# Patient Record
Sex: Male | Born: 1940 | Race: White | Hispanic: No | Marital: Married | Smoking: Former smoker
Health system: Southern US, Community
[De-identification: ages and names within clinical notes are randomized; demographics above are authoritative.]

## PROBLEM LIST (undated history)

## (undated) DIAGNOSIS — L570 Actinic keratosis: Secondary | ICD-10-CM

## (undated) DIAGNOSIS — I209 Angina pectoris, unspecified: Secondary | ICD-10-CM

## (undated) DIAGNOSIS — I10 Essential (primary) hypertension: Secondary | ICD-10-CM

## (undated) DIAGNOSIS — N401 Enlarged prostate with lower urinary tract symptoms: Secondary | ICD-10-CM

## (undated) DIAGNOSIS — I70219 Atherosclerosis of native arteries of extremities with intermittent claudication, unspecified extremity: Secondary | ICD-10-CM

## (undated) DIAGNOSIS — I639 Cerebral infarction, unspecified: Secondary | ICD-10-CM

## (undated) DIAGNOSIS — E669 Obesity, unspecified: Secondary | ICD-10-CM

## (undated) DIAGNOSIS — G4730 Sleep apnea, unspecified: Secondary | ICD-10-CM

## (undated) DIAGNOSIS — N475 Adhesions of prepuce and glans penis: Secondary | ICD-10-CM

## (undated) DIAGNOSIS — R0602 Shortness of breath: Secondary | ICD-10-CM

## (undated) DIAGNOSIS — N138 Other obstructive and reflux uropathy: Secondary | ICD-10-CM

## (undated) DIAGNOSIS — I509 Heart failure, unspecified: Secondary | ICD-10-CM

## (undated) DIAGNOSIS — E119 Type 2 diabetes mellitus without complications: Secondary | ICD-10-CM

## (undated) DIAGNOSIS — E785 Hyperlipidemia, unspecified: Secondary | ICD-10-CM

## (undated) HISTORY — DX: Other obstructive and reflux uropathy: N13.8

## (undated) HISTORY — DX: Hyperlipidemia, unspecified: E78.5

## (undated) HISTORY — DX: Atherosclerosis of native arteries of extremities with intermittent claudication, unspecified extremity: I70.219

## (undated) HISTORY — DX: Obesity, unspecified: E66.9

## (undated) HISTORY — DX: Angina pectoris, unspecified: I20.9

## (undated) HISTORY — DX: Shortness of breath: R06.02

## (undated) HISTORY — PX: CORONARY ANGIOPLASTY WITH STENT PLACEMENT: SHX49

## (undated) HISTORY — DX: Adhesions of prepuce and glans penis: N47.5

## (undated) HISTORY — DX: Heart failure, unspecified: I50.9

## (undated) HISTORY — PX: BACK SURGERY: SHX140

## (undated) HISTORY — DX: Essential (primary) hypertension: I10

## (undated) HISTORY — DX: Type 2 diabetes mellitus without complications: E11.9

## (undated) HISTORY — PX: COLONOSCOPY: SHX174

## (undated) HISTORY — DX: Cerebral infarction, unspecified: I63.9

## (undated) HISTORY — PX: CARDIAC CATHETERIZATION: SHX172

## (undated) HISTORY — DX: Actinic keratosis: L57.0

## (undated) HISTORY — DX: Other obstructive and reflux uropathy: N40.1

---

## 1994-07-11 HISTORY — PX: CORONARY ARTERY BYPASS GRAFT: SHX141

## 2002-07-11 HISTORY — PX: JOINT REPLACEMENT: SHX530

## 2005-07-11 HISTORY — PX: HEMORRHOID SURGERY: SHX153

## 2005-08-10 ENCOUNTER — Ambulatory Visit: Payer: Self-pay | Admitting: Emergency Medicine

## 2005-08-16 ENCOUNTER — Ambulatory Visit: Payer: Self-pay | Admitting: Emergency Medicine

## 2005-08-22 ENCOUNTER — Ambulatory Visit: Payer: Self-pay | Admitting: Emergency Medicine

## 2006-11-06 ENCOUNTER — Ambulatory Visit: Payer: Self-pay | Admitting: General Surgery

## 2006-11-06 ENCOUNTER — Other Ambulatory Visit: Payer: Self-pay

## 2006-11-09 ENCOUNTER — Ambulatory Visit: Payer: Self-pay | Admitting: Gastroenterology

## 2006-11-10 ENCOUNTER — Ambulatory Visit: Payer: Self-pay | Admitting: General Surgery

## 2007-02-06 ENCOUNTER — Ambulatory Visit: Payer: Self-pay | Admitting: Internal Medicine

## 2007-05-15 DIAGNOSIS — E785 Hyperlipidemia, unspecified: Secondary | ICD-10-CM

## 2007-05-15 DIAGNOSIS — R0609 Other forms of dyspnea: Secondary | ICD-10-CM

## 2007-05-15 DIAGNOSIS — Z87891 Personal history of nicotine dependence: Secondary | ICD-10-CM

## 2007-05-15 DIAGNOSIS — I2510 Atherosclerotic heart disease of native coronary artery without angina pectoris: Secondary | ICD-10-CM | POA: Insufficient documentation

## 2007-05-15 DIAGNOSIS — Z951 Presence of aortocoronary bypass graft: Secondary | ICD-10-CM | POA: Insufficient documentation

## 2007-05-15 DIAGNOSIS — R0989 Other specified symptoms and signs involving the circulatory and respiratory systems: Secondary | ICD-10-CM

## 2007-05-15 DIAGNOSIS — I10 Essential (primary) hypertension: Secondary | ICD-10-CM | POA: Insufficient documentation

## 2007-05-15 DIAGNOSIS — E119 Type 2 diabetes mellitus without complications: Secondary | ICD-10-CM | POA: Insufficient documentation

## 2010-08-17 ENCOUNTER — Ambulatory Visit: Payer: Self-pay | Admitting: Emergency Medicine

## 2010-08-22 LAB — PATHOLOGY REPORT

## 2011-04-08 DIAGNOSIS — E785 Hyperlipidemia, unspecified: Secondary | ICD-10-CM | POA: Insufficient documentation

## 2011-04-08 DIAGNOSIS — E119 Type 2 diabetes mellitus without complications: Secondary | ICD-10-CM | POA: Insufficient documentation

## 2011-04-08 DIAGNOSIS — M1710 Unilateral primary osteoarthritis, unspecified knee: Secondary | ICD-10-CM | POA: Insufficient documentation

## 2013-07-19 ENCOUNTER — Ambulatory Visit: Payer: Self-pay | Admitting: Podiatry

## 2013-08-02 ENCOUNTER — Ambulatory Visit (INDEPENDENT_AMBULATORY_CARE_PROVIDER_SITE_OTHER): Payer: Medicare Other | Admitting: Podiatry

## 2013-08-02 DIAGNOSIS — M2040 Other hammer toe(s) (acquired), unspecified foot: Secondary | ICD-10-CM

## 2013-08-02 DIAGNOSIS — E1159 Type 2 diabetes mellitus with other circulatory complications: Secondary | ICD-10-CM

## 2013-08-02 NOTE — Progress Notes (Signed)
Subjective:     Patient ID: [name] Ferguson, male   DOB: 1941-02-23, 73 y.o.   MRN: [id number]  HPI at risk diabetic who presents for diabetic shoes measurement today   Review of Systems     Objective:   Physical Exam No change health history    Assessment:     Structural foot issues with long-term diabetes as complicating factor    Plan:     Measured for diabetic shoes

## 2013-08-02 NOTE — Progress Notes (Signed)
Pt presents for diabetic shoe measurement , pt is measured at a 10 wide, ortho feet white lace 640

## 2013-08-16 ENCOUNTER — Ambulatory Visit: Payer: Self-pay | Admitting: Podiatry

## 2013-08-26 ENCOUNTER — Encounter: Payer: Self-pay | Admitting: *Deleted

## 2013-08-26 NOTE — Progress Notes (Signed)
SENT PT POSTCARD LETTING HIM KNOW DIABETIC SHOES ARE HERE.

## 2013-09-13 ENCOUNTER — Ambulatory Visit (INDEPENDENT_AMBULATORY_CARE_PROVIDER_SITE_OTHER): Payer: Medicare Other | Admitting: Podiatry

## 2013-09-13 ENCOUNTER — Encounter: Payer: Self-pay | Admitting: Podiatry

## 2013-09-13 VITALS — BP 127/62 | HR 66 | Resp 16 | Ht 68.0 in | Wt 227.0 lb

## 2013-09-13 DIAGNOSIS — M2040 Other hammer toe(s) (acquired), unspecified foot: Secondary | ICD-10-CM

## 2013-09-13 DIAGNOSIS — E1159 Type 2 diabetes mellitus with other circulatory complications: Secondary | ICD-10-CM

## 2013-09-13 NOTE — Progress Notes (Signed)
Subjective:     Patient ID: [name] Ferguson, male   DOB: 04/17/1941, 73 y.o.   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

## 2013-11-07 DIAGNOSIS — I509 Heart failure, unspecified: Secondary | ICD-10-CM | POA: Insufficient documentation

## 2014-03-18 DIAGNOSIS — E669 Obesity, unspecified: Secondary | ICD-10-CM | POA: Insufficient documentation

## 2014-10-21 ENCOUNTER — Encounter
Admit: 2014-10-21 | Disposition: A | Discharge status: Home / Self Care | Payer: Self-pay | Attending: Internal Medicine | Admitting: Internal Medicine

## 2014-11-10 ENCOUNTER — Encounter: Payer: Self-pay | Admitting: Occupational Therapy

## 2014-11-10 ENCOUNTER — Ambulatory Visit: Payer: Medicare Other | Admitting: Occupational Therapy

## 2014-11-10 ENCOUNTER — Encounter: Payer: Self-pay | Admitting: Physical Therapy

## 2014-11-10 ENCOUNTER — Ambulatory Visit: Payer: Medicare Other | Attending: Internal Medicine | Admitting: Physical Therapy

## 2014-11-10 VITALS — BP 113/57

## 2014-11-10 DIAGNOSIS — R419 Unspecified symptoms and signs involving cognitive functions and awareness: Secondary | ICD-10-CM | POA: Insufficient documentation

## 2014-11-10 DIAGNOSIS — I69398 Other sequelae of cerebral infarction: Secondary | ICD-10-CM | POA: Diagnosis present

## 2014-11-10 DIAGNOSIS — H53462 Homonymous bilateral field defects, left side: Secondary | ICD-10-CM

## 2014-11-10 DIAGNOSIS — I6930 Unspecified sequelae of cerebral infarction: Secondary | ICD-10-CM | POA: Insufficient documentation

## 2014-11-10 DIAGNOSIS — I639 Cerebral infarction, unspecified: Secondary | ICD-10-CM

## 2014-11-10 NOTE — Patient Instructions (Addendum)
(  Home) Squat: (Assist)   Using supports, arms close to body, squat by dropping hips back as if sitting in a chair. Repeat __10__ times per set. Do _2-3___ sets per session. Do _7___ sessions per week.  Have the hand hold assist in front of you (the chair or a counter top).Side Stepping   Band Walk: Side Stepping   Tie band around legs, just above knees. Step 10___ feet to one side, then step back to start. Repeat 2x per side for 10 feet. Try 2 different sets with 1 minute in between.  Repeat ___ feet per session. Note: Small towel between band and skin eases rubbing. You DO NOT need a band for now. Can hold on to the counter top for additional balance support if necessary.   http://plyo.exer.us/76   Copyright  VHI. All rights reserved.

## 2014-11-10 NOTE — Patient Instructions (Signed)
Patient instructed to practice finding objects to the left at a table, in the bathroom, and during every day ADL.

## 2014-11-10 NOTE — Therapy (Signed)
[place] MAIN [facility] SERVICES [address] [place], [place], [address] Phone: [phone]   Fax:  [phone]  Physical Therapy Treatment  Patient Details  Name: [name] Ferguson MRN: [id number] Date of Birth: 03/15/1941 Referring Provider:  Cletis Athens, MD  Encounter Date: 11/10/2014      PT End of Session - 11/10/14 1115    Visit Number 7   Number of Visits 16   Date for PT Re-Evaluation 12/16/14   PT Start Time 0954   PT Stop Time 1040   PT Time Calculation (min) 46 min   Activity Tolerance Patient tolerated treatment well   Behavior During Therapy [facility] for tasks assessed/performed      Past Medical History  Diagnosis Date  . Diabetes mellitus without complication   . Hypertension   . CHF (congestive heart failure)   . Stroke     Past Surgical History  Procedure Laterality Date  . Joint replacement      Knee  . Back surgery    . Coronary artery bypass graft      Filed Vitals:   11/10/14 1041  BP: 113/57    Visit Diagnosis:  CVA (cerebral vascular accident)      Subjective Assessment - 11/10/14 1103    Subjective Pt states after he was leaving [employer] he mis-judged a step and injured his right hip. He has previously had pain in this right hip, and now seems to have a new episode of this. Pt states weight bearing is painful around the greater trochanter.    Pertinent History Pt had a CVA on 10/18/2014.   Limitations Lifting;Standing;Walking   Patient Stated Goals To decrease right hip pain and increase strength/balance.    Currently in Pain? Yes   Pain Score 3    Pain Location Hip   Pain Orientation Right   Pain Type Chronic pain   Aggravating Factors  Weight bearing    Pain Relieving Factors Non-weight bearing             [facility] PT Assessment - 11/10/14 0001    Assessment   Medical Diagnosis CVA   Onset Date 10/18/14   Restrictions   Weight Bearing Restrictions No   Balance Screen   Has the patient fallen  in the past 6 months No   Has the patient had a decrease in activity level because of a fear of falling?  Yes   Prior Function   Level of Independence Independent with basic ADLs   Sit to Stand   Comments 5 time sit to stand 19.36 seconds   Ambulation/Gait   Gait velocity 1.1 m/s   6 Minute Walk- Baseline   6 Minute Walk- Baseline yes  1300 ft   Timed Up and Go Test   TUG Normal TUG   Normal TUG (seconds) 9.47      Therapeutic Exercise: Long axis hip distraction grade I-II to decrease RLE hip pain --> well tolerated. Soft tissue mobilization provided to right greater trochanter region to decrease sensitivity and reduce RLE hip pain --> well tolerated.  Piriformis stretching RLE 3 second hold, 3 seconds off for 1 minute x 2 rounds. Well tolerated, stated this was at the same spot he was feeling symptomatic.  Stepping onto and off of blue foam pad bilaterally x 10 repetitions (with 2 finger support on railing). Initially pt unable to tolerate without external support. With 2 finger support, pt tolerated well.  Standing hip abduction x 5 on RLE -->  unable to tolerate due to right hip pain.  Squats with hand hold assist on rail x 12 with cuing for wider stance --> well tolerated.  Squats without hand hold assist x 12 --> well tolerated.  Side stepping x 10' bilaterally x 2 --> well tolerated. Cuing for upright trunk and hip flexion.                        PT Education - 11/10/14 1114    Education provided Yes   Education Details Handout to complete supported squats and side stepping for HEP.    Person(s) Educated Patient   Methods Explanation;Demonstration;Verbal cues;Handout   Comprehension Returned demonstration             PT Long Term Goals - 11/10/14 1116    PT LONG TERM GOAL #1   Title Patient (> 64 years old) will increase single limb stance time to > 14 seconds to reduce fall risk during single limb stance activities by 12/16/2014.   Status New   PT  LONG TERM GOAL #2   Title Patient will improve Dynamic Gait Index (DGI) score to > 20/24 for low falls risk regarding dynamic walking tasks by 12/16/2014   Status New   PT LONG TERM GOAL #3   Title Patient (> 39 years old) will complete five times sit to stand test in < 14.2 seconds indicating a decreased likelihood of a balance dysfunction and improved LE strength by 12/16/2014   Status New               Plan - 11/10/14 1121    Clinical Impression Statement Pt presents today with increased RLE hip pain, limiting his participation in balance activities. Pt responded well with bilateral strengthening exercises and long axis distractions, stating his symptoms improved afterwards. Pt is displaying decreased LE strength and Trendelenburg gait bilaterally, indicating gluteus medius weakness.  Pt has antalgic gait currently, though he states that his RLE pain has decreased since he tripped last Friday. Pt displays the ability to perform higher level gait tasks today such as 360 degree turning, head turns with ambulation, and side stepping with no loss of balance. Pt would benefit from continued strengthening, pain relief of RLE, and balance activities.    Pt will benefit from skilled therapeutic intervention in order to improve on the following deficits Abnormal gait;Cardiopulmonary status limiting activity;Decreased balance;Pain;Decreased strength   Rehab Potential Good   Clinical Impairments Affecting Rehab Potential Motivated    PT Frequency 2x / week   PT Duration 8 weeks   PT Treatment/Interventions Therapeutic exercise;Balance training;Neuromuscular re-education   PT Next Visit Plan Re-assess RLE hip pain, progress with LE strengthening and balance activities as tolerated.    PT Home Exercise Plan Squats and side stepping    Recommended Other Services Occupational therapy (presently seeing)    Consulted and Agree with Plan of Care Patient        Problem List Patient Active Problem List    Diagnosis Date Noted  . DIABETES MELLITUS, TYPE II 05/15/2007  . HYPERLIPIDEMIA 05/15/2007  . HYPERTENSION 05/15/2007  . CORONARY ARTERY DISEASE 05/15/2007  . DYSPNEA ON EXERTION 05/15/2007  . TOBACCO ABUSE, HX OF 05/15/2007  . CORONARY ARTERY BYPASS GRAFT, HX OF 05/15/2007    Kerman Passey, [place] MAIN [facility] SERVICES [address] [place], [place], [address] Phone: [phone]   Fax:  [phone]

## 2014-11-10 NOTE — Therapy (Signed)
[place] MAIN [facility] SERVICES [address] [place], [place], [address] Phone: [phone]   Fax:  [phone]  Occupational Therapy Treatment  Patient Details  Name: [name] Ferguson MRN: [id number] Date of Birth: 02/01/1941 Referring Provider:  Cletis Athens, MD  Encounter Date: 11/10/2014      OT End of Session - 11/10/14 1007    Visit Number 5   Number of Visits 24   Date for OT Re-Evaluation 01/15/15   Authorization Type medicare G codes 5 of 10   OT Start Time 0905   OT Stop Time 0945   OT Time Calculation (min) 40 min      History reviewed. No pertinent past medical history.  History reviewed. No pertinent past surgical history.  There were no vitals filed for this visit.  Visit Diagnosis:  Left homonymous hemianopsia      Subjective Assessment - 11/10/14 0950    Subjective  I feel like I am seeing a little better.     Currently in Pain? 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   Coordination   Right 9 Hole Peg Test 27   Left 9 Hole Peg Test 27     THERAPEUTIC ACTIVITIES  Practiced manipulating objects to the far left including playing cards, bathroom objects, and hand writing to the left and right. Needed cues for compensatory techniques to find objects and for more functional ways to get ADL objects with safety.                     OT Education - 11/10/14 [phone]    Education provided Yes  Gave HEP - Educated on cause of vision deficits.             OT Long Term Goals - 11/10/14 1020    OT LONG TERM GOAL #1   Title Patient will use compensatory techniques to be able to consistently  find objects on the table    Time 12   Period Weeks   Status New   OT LONG TERM GOAL #2   Title Patient will use compensatory techniques to be able to consistently find objects in the bathroom   Time 12   Period Weeks   Status New   OT LONG TERM GOAL #3   Title Patient will be able to make coffee using compensatory techniques    Time 12   Period Weeks   Status New   OT LONG TERM GOAL #4   Title Patient will use compensatory techniques to reduce bumping into objects    Time 12   Period Weeks   Status New               Plan - 11/10/14 1012    Clinical Impression Statement Patient has significant left neglect but does compensate fairly well. He has walked into objects damaging his glasses.   Pt will benefit from skilled therapeutic intervention in order to improve on the following deficits (Retired) Impaired vision/preception   Rehab Potential Good   OT Frequency 2x /  week   OT Duration 12 weeks   OT Treatment/Interventions Therapeutic activities   Plan Continue theraputic activities compensating for vision deficits.        Problem List Patient Active Problem List   Diagnosis Date Noted  . DIABETES MELLITUS, TYPE II 05/15/2007  . HYPERLIPIDEMIA 05/15/2007  . HYPERTENSION 05/15/2007  . CORONARY ARTERY DISEASE 05/15/2007  . DYSPNEA ON EXERTION 05/15/2007  . TOBACCO ABUSE, HX OF 05/15/2007  . CORONARY ARTERY BYPASS GRAFT, HX OF 05/15/2007    Sharon Mt, OTR/L  [place] MAIN [facility] SERVICES [address] [place], [place], [address] Phone: [phone]   Fax:  [phone]

## 2014-11-12 ENCOUNTER — Ambulatory Visit: Payer: Medicare Other | Admitting: Occupational Therapy

## 2014-11-12 ENCOUNTER — Ambulatory Visit: Payer: Medicare Other | Admitting: Physical Therapy

## 2014-11-12 ENCOUNTER — Encounter: Payer: Self-pay | Admitting: Occupational Therapy

## 2014-11-12 DIAGNOSIS — I639 Cerebral infarction, unspecified: Secondary | ICD-10-CM

## 2014-11-12 DIAGNOSIS — I6930 Unspecified sequelae of cerebral infarction: Secondary | ICD-10-CM | POA: Diagnosis not present

## 2014-11-12 DIAGNOSIS — H53462 Homonymous bilateral field defects, left side: Secondary | ICD-10-CM

## 2014-11-12 NOTE — Therapy (Signed)
[place] MAIN [facility] SERVICES [address] [place], [place], [address] Phone: [phone]   Fax:  [phone]  Occupational Therapy Treatment  Patient Details  Name: [name] Ferguson MRN: [id number] Date of Birth: 1940-10-07 Referring Provider:  Cletis Athens, MD  Encounter Date: 11/12/2014      OT End of Session - 11/12/14 1153    Visit Number 6   Number of Visits 24   Date for OT Re-Evaluation 01/15/15   Authorization Type medicare G codes 6 of 10   OT Start Time 1022   OT Stop Time 1052   OT Time Calculation (min) 30 min   Activity Tolerance Patient tolerated treatment well   Behavior During Therapy [facility] for tasks assessed/performed      Past Medical History  Diagnosis Date  . Diabetes mellitus without complication   . Hypertension   . CHF (congestive heart failure)   . Stroke     Past Surgical History  Procedure Laterality Date  . Joint replacement      Knee  . Back surgery    . Coronary artery bypass graft      There were no vitals filed for this visit.  Visit Diagnosis:  Left homonymous hemianopsia      Subjective Assessment - 11/12/14 1042    Subjective  I still feel like I am seeing better.   Currently in Pain? No/denies   Pain Score 0-No pain     Utilized compensatory techniques for vision including walking down hall and identifying objects to the left.  Wrote a Network engineer check and red out loud. Patient reports that he feels the reading is close to before and the writing is as before. Patient did need cues to spot some objects.                         OT Education - 11/12/14 1151    Education provided Yes   Education Details Education on using neuro opthomologist.   Person(s) Educated Patient   Methods Explanation   Comprehension Verbalized understanding             OT Long Term Goals - 11/10/14 1020    OT LONG TERM GOAL #1   Title Patient will use compensatory techniques to be  able to consistently find objects on the table    Time 12   Period Weeks   Status New   OT LONG TERM GOAL #2   Title Patient will use compensatory techniques to be able to consistently find objects in the bathroom   Time 12   Period Weeks   Status New   OT LONG TERM GOAL #3   Title Patient will be able to make coffee using compensatory techniques    Time 12   Period Weeks   Status New   OT LONG TERM GOAL #4   Title Patient will use compensatory techniques to reduce bumping into objects    Time 12   Period Weeks   Status New               Plan - 11/12/14 1156    Clinical Impression Statement Patient doing well with ADL and compensating well most of the time.   Pt will benefit from skilled therapeutic intervention in order to improve on the following deficits (Retired) Impaired vision/preception   Rehab Potential Good   OT Frequency 2x / week   OT Duration 12 weeks   OT Treatment/Interventions  Visual/perceptual remediation/compensation   Plan Will contitnue with compensatory techniques for vision and test severity of neglect.        Problem List Patient Active Problem List   Diagnosis Date Noted  . DIABETES MELLITUS, TYPE II 05/15/2007  . HYPERLIPIDEMIA 05/15/2007  . HYPERTENSION 05/15/2007  . CORONARY ARTERY DISEASE 05/15/2007  . DYSPNEA ON EXERTION 05/15/2007  . TOBACCO ABUSE, HX OF 05/15/2007  . CORONARY ARTERY BYPASS GRAFT, HX OF 05/15/2007    Sharon Mt 11/12/2014, 12:04 PM  [place] MAIN [facility] SERVICES [address] [place], [place], [address] Phone: [phone]   Fax:  [phone]

## 2014-11-12 NOTE — Patient Instructions (Signed)
SINGLE LIMB STANCE   Stance: single leg on floor. Raise leg. Hold _at least 5 seconds, progress to 10  seconds. Repeat with other leg. _3__ reps per set, __1_ sets per day, _7__ days per week  Copyright  VHI. All rights reserved.

## 2014-11-12 NOTE — Therapy (Signed)
[place] MAIN [facility] SERVICES [address] [place], [place], [address] Phone: [phone]   Fax:  [phone]  Physical Therapy Treatment  Patient Details  Name: [name] Ferguson MRN: [id number] Date of Birth: September 04, 1940 Referring Provider:  Cletis Athens, MD  Encounter Date: 11/12/2014      PT End of Session - 11/12/14 1202    Visit Number 8   Number of Visits 16   Date for PT Re-Evaluation 12/16/14   PT Start Time 1101   PT Stop Time 1145   PT Time Calculation (min) 44 min   Equipment Utilized During Treatment Gait belt   Activity Tolerance Patient tolerated treatment well;No increased pain   Behavior During Therapy [facility] for tasks assessed/performed      Past Medical History  Diagnosis Date  . Diabetes mellitus without complication   . Hypertension   . CHF (congestive heart failure)   . Stroke     Past Surgical History  Procedure Laterality Date  . Joint replacement      Knee  . Back surgery    . Coronary artery bypass graft      There were no vitals filed for this visit.  Visit Diagnosis:  CVA (cerebral vascular accident)      Subjective Assessment - 11/12/14 1102    Subjective Pt reports he has noticed his hip pain has improved since Monday, and reports no pain in the hip today. Pt walked on the treadmill for 10 minutes, and 20 minutes on the elliptical. His only complaint is flexing his hip and reaching down to tie his shoe causes pain in the hip.    Pertinent History Pt had a CVA on 10/18/2014.   Limitations Other (comment)  Balance deficits in single leg stance, tying shoes when foot on a chair.    Patient Stated Goals To improve balance.    Currently in Pain? No/denies     Neuromuscular Re-education  Long axis distraction grade II-II on RLE to reduce RLE symptoms for participation in balance activities --> well tolerated  Flexion/adduction quadrant scouring overpressure grade I-II to reduce RLE symptoms for  participation in balance activities --> well tolerated.  Sit to stand from chair with cue "to move as quickly as possible" 2 sets x 10 repetitions, cuing for increased hip flexion on descent.   Steps ups to 6" box reciprocally for 30 seconds x 2 repetitions with cue " to ascend and descend as fast as you can safely".  Toe touches to 6' box for 30 seconds "as fast as possible".  Lateral Step Ups to 6" box "as fast as possible" for 30 seconds on LLE Single leg stance x 10 repetitions on LLE to increase balance on LLE. Pt was able to achieve a maximum of 5 second hold. Pt continued to lose balance laterally secondary to hip abductor weakness.   Engineer, building services through cone course with cues for lateral weight shifting and negotiating turns. Pt then completed with toe touching of cones throughout ambulation. X 4 repetitions. Course included a right angle and involved 360 degree turns, which he completed with no loss of balance.   Single leg hip marching, holding in hip flexion for at least 1 count 2 sets forward x 10', 1 set backward x 10'.  Dynamic gait drills x 50' with head turns, cuing for slow head turns, and x 50' tandem walking. Pt required cuing for full foot contact on LLE as he lost his balance on  several occassions.   Balance drills on agility ladder including: Single leg marching from one square to the next, side stepping laterally, stepping anteriorly at oblique angles. 2 sets of each completing the entire agility ladder.                        PT Education - 11/12/14 1159    Education provided Yes   Education Details Patient educated on where in his house to complete single leg balance (by a counter top), and to complete the previously given exercises (side band walks and sit to stands). Pt eductaed that he has a balance deficit with single leg stance on LLE, can improve by making sure he maintains full foot contact with the ground in single leg stance.     Person(s) Educated Patient   Methods Explanation;Demonstration;Handout   Comprehension Verbalized understanding;Returned demonstration             PT Long Term Goals - 11/12/14 1202    PT LONG TERM GOAL #1   Title Patient (> 57 years old) will increase single limb stance time to > 14 seconds to reduce fall risk during single limb stance activities by 12/16/2014.   Status New   PT LONG TERM GOAL #2   Title Patient will improve Dynamic Gait Index (DGI) score to > 20/24 for low falls risk regarding dynamic walking tasks by 12/16/2014   Status New   PT LONG TERM GOAL #3   Title Patient (> 48 years old) will complete five times sit to stand test in < 14.2 seconds indicating a decreased likelihood of a balance dysfunction and improved LE strength by 12/16/2014   Status New               Plan - 11/12/14 1203    Clinical Impression Statement Pt presents with decreased pain in RLE today and was able to fully participate in all balance activities with no increase in pain. Pt continues to display Trendelenburg gait, which is impairing his balance. This seems to be related to his left sided weakness, as he continues to display decreased single leg stance balance on LLE compared to RLE.  Pt would benefit from continued work on LLE strengthening and overall balance activities to improve safety with mobility.    Pt will benefit from skilled therapeutic intervention in order to improve on the following deficits Abnormal gait;Cardiopulmonary status limiting activity;Decreased balance;Pain;Decreased strength   Rehab Potential Good   Clinical Impairments Affecting Rehab Potential Mild-moderate visual impairments. Left sided weakness residual from CVA.    PT Frequency 2x / week   PT Duration 8 weeks   PT Treatment/Interventions Therapeutic exercise;Balance training;Neuromuscular re-education   PT Next Visit Plan Re-assess RLE hip pain, progress with LE strengthening and balance activities as tolerated.     PT Home Exercise Plan Squats, single leg balance, and side stepping    Recommended Other Services Occupational therapy is currently seeing.    Consulted and Agree with Plan of Care Patient        Problem List Patient Active Problem List   Diagnosis Date Noted  . DIABETES MELLITUS, TYPE II 05/15/2007  . HYPERLIPIDEMIA 05/15/2007  . HYPERTENSION 05/15/2007  . CORONARY ARTERY DISEASE 05/15/2007  . DYSPNEA ON EXERTION 05/15/2007  . TOBACCO ABUSE, HX OF 05/15/2007  . CORONARY ARTERY BYPASS GRAFT, HX OF 05/15/2007    Kerman Passey, PT, DPT     [place] MAIN [facility] SERVICES  [place], [place], [address] Phone: [phone]   Fax:  [phone]

## 2014-11-17 ENCOUNTER — Ambulatory Visit: Payer: Medicare Other | Admitting: Physical Therapy

## 2014-11-17 ENCOUNTER — Encounter: Payer: Self-pay | Admitting: Physical Therapy

## 2014-11-17 ENCOUNTER — Ambulatory Visit: Payer: Medicare Other | Admitting: Occupational Therapy

## 2014-11-17 DIAGNOSIS — H53462 Homonymous bilateral field defects, left side: Secondary | ICD-10-CM

## 2014-11-17 DIAGNOSIS — I6930 Unspecified sequelae of cerebral infarction: Secondary | ICD-10-CM | POA: Diagnosis not present

## 2014-11-17 DIAGNOSIS — I639 Cerebral infarction, unspecified: Secondary | ICD-10-CM

## 2014-11-17 NOTE — Therapy (Signed)
[place] MAIN [facility] SERVICES [address] [place], [place], [address] Phone: [phone]   Fax:  [phone]  Physical Therapy Treatment  Patient Details  Name: [name] Ferguson MRN: [id number] Date of Birth: [birth year] Referring Provider:  Cletis Athens, MD  Encounter Date: 11/17/2014      PT End of Session - 11/17/14 1155    Visit Number 9   Number of Visits 16   Date for PT Re-Evaluation 12/16/14   PT Start Time 1104   PT Stop Time 1152   PT Time Calculation (min) 48 min   Equipment Utilized During Treatment Gait belt   Activity Tolerance Patient tolerated treatment well;No increased pain   Behavior During Therapy [facility] for tasks assessed/performed      Past Medical History  Diagnosis Date  . Diabetes mellitus without complication   . Hypertension   . CHF (congestive heart failure)   . Stroke     Past Surgical History  Procedure Laterality Date  . Joint replacement      Knee  . Back surgery    . Coronary artery bypass graft      There were no vitals filed for this visit.  Visit Diagnosis:  CVA (cerebral vascular accident)      Subjective Assessment - 11/17/14 1105    Subjective Patient reports that he has been inconsistent with his HEP, but has been going to the gym fairly frequently focusing on LE strength. Patient is now able to tie his shoes, he states the pirfiromis stretching/hip mobilizations have been helpful. Patient reports his peripheral vision is returning, and that he is not noticing himself bumping into things nearly as much as he was.    Pertinent History Pt had a CVA on 10/18/2014.   Limitations Other (comment)  Declines   Patient Stated Goals To improve balance.    Currently in Pain? No/denies                         [facility] Adult PT Treatment/Exercise - 11/17/14 0001    Transfers   Sit to Stand 7: Independent  5x sit to stand 10.51 seconds       Neuromuscular re-education Single leg  stance bilaterally x 10. Best duration on RLE - 16 seconds, 8.6 seconds on LLE.  Step ups to 6" box with LLE elevated x 15 repetitions.  Tandem stance on blue foam pad x 3 bilaterally  Single leg stance on LLE, reaching forward to touch cone with RLE, laterally, and posterolaterally. One loss of balance during posterolaterally. Patient cued to have full foot contact with LLE prior to initiating single leg stance.  Standing hip abduction and extension on LLE x 10 each without HHA.    Gait Training DGI - 23/24 today, scored a 2/3 on horizontal head turns. Walking x 75' x 2 repetitions with the cue "as fast as possible" with no loss of balance.  Grapevine walking x 40' x 4 repetitions bilaterally with cuing for technique and to progress speed as tolerated. Patient struggled going to his left secondary to decreased LLE single leg stance with dynamic multiplanar movement.  Tandem walking forwards and backwards x 10' x 4 repetitions with minor losses of balance laterally.  Agility drills on step ladder (single hip flexion marching, lateral step ins -"quickly", hip flexion followed by abduction, and forward step ins "quickly". Patient had minor loss of balance laterally with single hip flexion, but was able to recover.  Hip flexion ambulation with cone touching, and tandem stance ambulation with cone touching x 10' for 2 repetitons each. Patient with no loss of balance, though he did require multiple attempts at cone touching when standing on LLE.     DGI - 23/24           PT Long Term Goals - 11/17/14 1202    PT LONG TERM GOAL #1   Title Patient (> 75 years old) will increase single limb stance time to > 14 seconds to reduce fall risk during single limb stance activities by 12/16/2014.   Status Partially Met   PT LONG TERM GOAL #2   Status Achieved   PT LONG TERM GOAL #3   Status Achieved               Plan - 11/17/14 1156    Clinical Impression Statement Patient demonstrates  significant improvement in dynamic balance  (DGI of 23 today improved from 14 at evaluation), and LE strength/power (5x sit to stand in 10.51 seconds today, 19.36 seconds at baseline). Patient today demonstrates improved single leg stance on LLE, however there is still a disparity of his LLE balance compared to RLE in single leg stance.  Patient would benefit from continued work with skilled PT services to increase his LE strength, single leg balance on LLE, and weight shifting onto LLE during gait.   Pt will benefit from skilled therapeutic intervention in order to improve on the following deficits Abnormal gait;Cardiopulmonary status limiting activity;Decreased balance;Pain;Decreased strength   Rehab Potential Good   Clinical Impairments Affecting Rehab Potential Age and history of CVA.    PT Frequency 2x / week   PT Duration 8 weeks   PT Treatment/Interventions Therapeutic exercise;Balance training;Neuromuscular re-education   PT Next Visit Plan Re-assess single leg stance, progress gait speed training and agility training. Provide HEP for LE strengthening program, focusing on single leg activities.    PT Home Exercise Plan Single leg stance balance and    Recommended Other Services Occupational therapy is currently seeing.    Consulted and Agree with Plan of Care Patient        Problem List Patient Active Problem List   Diagnosis Date Noted  . DIABETES MELLITUS, TYPE II 05/15/2007  . HYPERLIPIDEMIA 05/15/2007  . HYPERTENSION 05/15/2007  . CORONARY ARTERY DISEASE 05/15/2007  . DYSPNEA ON EXERTION 05/15/2007  . TOBACCO ABUSE, HX OF 05/15/2007  . CORONARY ARTERY BYPASS GRAFT, HX OF 05/15/2007    Kerman Passey, PT, DPT    11/17/2014, 12:42 PM  [place] MAIN [facility] SERVICES [address] [place], [place], [address] Phone: [phone]   Fax:  [phone]

## 2014-11-17 NOTE — Therapy (Signed)
[place] MAIN [facility] SERVICES [address] [place], [place], [address] Phone: [phone]   Fax:  [phone]  Occupational Therapy Treatment  Patient Details  Name: [name] Ferguson MRN: [id number] Date of Birth: November 07, 1940 Referring Provider:  Cletis Athens, MD  Encounter Date: 11/17/2014      OT End of Session - 11/17/14 1042    Visit Number 7   Number of Visits 24   Date for OT Re-Evaluation 01/15/15   Authorization Type medicare G codes 7 of 10   OT Start Time 6712   OT Stop Time 1100   OT Time Calculation (min) 46 min   Activity Tolerance Patient tolerated treatment well   Behavior During Therapy [facility] for tasks assessed/performed      Past Medical History  Diagnosis Date  . Diabetes mellitus without complication   . Hypertension   . CHF (congestive heart failure)   . Stroke     Past Surgical History  Procedure Laterality Date  . Joint replacement      Knee  . Back surgery    . Coronary artery bypass graft      There were no vitals filed for this visit.  Visit Diagnosis:  Left homonymous hemianopsia  CVA (cerebral vascular accident) Used [employer] peg board all three components and removed alternating fingers for pegs. Completed card flipping with wrist with cards placed far to the left and patient looking straight ahead.  Picked up coins starting largest to smallest and placed in container placed far to the left. Patient did well compensating for left visual cut which does continue to improve.     Subjective Assessment - 11/17/14 1028    Subjective  I dropped my coffee this morning   Currently in Pain? No/denies                              OT Education - 11/17/14 1033    Education provided Yes   Education Details Educated patient that his eye sight is improving   Person(s) Educated Patient   Methods Explanation   Comprehension Verbalized understanding             OT Long Term Goals -  11/10/14 1020    OT LONG TERM GOAL #1   Title Patient will use compensatory techniques to be able to consistently find objects on the table    Time 12   Period Weeks   Status New   OT LONG TERM GOAL #2   Title Patient will use compensatory techniques to be able to consistently find objects in the bathroom   Time 12   Period Weeks   Status New   OT LONG TERM GOAL #3   Title Patient will be able to make coffee using compensatory techniques    Time 12   Period Weeks   Status New   OT LONG TERM GOAL #4   Title Patient will use compensatory techniques to reduce bumping into objects    Time 12   Period Weeks   Status New               Plan - 11/17/14 1111    Clinical Impression Statement Patient continues  to do well with ADL. Eye sight improving with more lateral vision   Pt will benefit from skilled therapeutic intervention in order to improve on the following deficits (Retired) Impaired vision/preception   Rehab Potential Good   OT  Frequency 2x / week   OT Duration 12 weeks   OT Treatment/Interventions Visual/perceptual remediation/compensation   Plan Continue with compensatory visual technique. Consider discharge soon from occupational therapy        Problem List Patient Active Problem List   Diagnosis Date Noted  . DIABETES MELLITUS, TYPE II 05/15/2007  . HYPERLIPIDEMIA 05/15/2007  . HYPERTENSION 05/15/2007  . CORONARY ARTERY DISEASE 05/15/2007  . DYSPNEA ON EXERTION 05/15/2007  . TOBACCO ABUSE, HX OF 05/15/2007  . CORONARY ARTERY BYPASS GRAFT, HX OF 05/15/2007   Sharon Mt, MS/OTR/L Sharon Mt 11/17/2014, 12:09 PM  [place] MAIN [facility] SERVICES [address] [place], [place], [address] Phone: [phone]   Fax:  [phone]

## 2014-11-17 NOTE — Patient Instructions (Signed)
Patient declined instructions for HEP stating her "had it down".   HEP given was single leg stance bilaterally and tandem stance bilaterally.

## 2014-11-19 ENCOUNTER — Ambulatory Visit: Payer: Medicare Other | Admitting: Occupational Therapy

## 2014-11-19 ENCOUNTER — Ambulatory Visit: Payer: Medicare Other | Admitting: Physical Therapy

## 2014-11-19 ENCOUNTER — Encounter: Payer: Self-pay | Admitting: Physical Therapy

## 2014-11-19 DIAGNOSIS — I639 Cerebral infarction, unspecified: Secondary | ICD-10-CM

## 2014-11-19 DIAGNOSIS — H53462 Homonymous bilateral field defects, left side: Secondary | ICD-10-CM

## 2014-11-19 DIAGNOSIS — I6930 Unspecified sequelae of cerebral infarction: Secondary | ICD-10-CM | POA: Diagnosis not present

## 2014-11-19 NOTE — Patient Instructions (Signed)
TRX squats from [employer]  Starting in standing position, holding on to the handles, leading with your hips, squat down as far as desired using the handles for balance.  Use your legs to stand to starting position.  12 repetitions, 2 sets, 3 times per week.    TRILOGY Leg Press  from [employer]  Leg Press  Set weight of machine.  Sitting in the machine, put feet up onto plate positioning them so that your knees are directly over ankles and knees are bent to 90 degrees.  Push plate forward until knees are straight but not locked.  Slowly return to previous position. 12 repetitions, 2 sets, 3 times per week.      [employer]  from [employer]  For this exercise you will need a knee extension machine.  Set adjustments and weight.  Then raise the pad until the knee is almost completed extended.  Hold this position with one leg.  Continue to hold position for desired time while alternating legs in time increments.  12 repetitions, 2 sets, 3 times per week.

## 2014-11-19 NOTE — Therapy (Signed)
[place] MAIN [facility] SERVICES [address] [place], [place], [address] Phone: [phone]   Fax:  [phone]  Occupational Therapy Treatment  And Discharge Summary   Patient Details  Name: [name] Ferguson MRN: [id number] Date of Birth: 23-Jun-1941 Referring Provider:  Cletis Athens, MD  Encounter Date: 2014/12/06      OT End of Session - 12/06/2014 1149    Visit Number 8   Number of Visits 24   Date for OT Re-Evaluation 01/15/15   OT Start Time 1025   OT Stop Time 1055   OT Time Calculation (min) 30 min   Activity Tolerance Patient tolerated treatment well   Behavior During Therapy [facility] for tasks assessed/performed      Past Medical History  Diagnosis Date  . Diabetes mellitus without complication   . Hypertension   . CHF (congestive heart failure)   . Stroke     Past Surgical History  Procedure Laterality Date  . Joint replacement      Knee  . Back surgery    . Coronary artery bypass graft      There were no vitals filed for this visit.  Visit Diagnosis:  Left homonymous hemianopsia  CVA (cerebral vascular accident)    Remaining deficits: Patient still has left visual field cut in both eyes but is much improved.   Education / Equipment: Continue with compensatory techniques for visual field cut. Plan: Patient agrees to discharge.  Patient goals were not met. Patient is being discharged due to meeting the stated rehab goals.  ?????                              OT Education - 2014/12/06 1042    Education provided Yes   Education Details To continue with compensatory techniques.   Person(s) Educated Patient   Methods Explanation   Comprehension Verbalized understanding             OT Long Term Goals - 06-Dec-2014 1029    OT LONG TERM GOAL #1   Status Achieved   OT LONG TERM GOAL #2   Status Achieved   OT LONG TERM GOAL #3   Status Achieved   OT LONG TERM GOAL #4   Status Achieved                Plan - 06-Dec-2014 1150    Clinical Impression Statement Patient has achieved all his goals and is compensating well. The visual cut has reduced more so in left eye thant right eye.   OT Treatment/Interventions Visual/perceptual remediation/compensation   Plan Discharge from Occupational Therapy.   OT Home Exercise Plan Continue to practice compensatory techniques   Consulted and Agree with Plan of Care Patient          G-Codes - Dec 06, 2014 1141    Functional Assessment Tool Used Clinical Judgment   Functional Limitation Self care   Self Care Goal Status (I9485) At least 1 percent but less than 20 percent impaired, limited or restricted   Self Care Discharge Status [phone]) At least 1 percent but less than 20 percent impaired, limited or restricted      Problem List Patient Active Problem List   Diagnosis Date Noted  . DIABETES MELLITUS, TYPE II 05/15/2007  . HYPERLIPIDEMIA 05/15/2007  . HYPERTENSION 05/15/2007  . CORONARY ARTERY DISEASE 05/15/2007  . DYSPNEA ON EXERTION 05/15/2007  . TOBACCO ABUSE, HX OF 05/15/2007  . CORONARY  ARTERY BYPASS GRAFT, HX OF 05/15/2007   Sharon Mt, MS/OTR/L Sharon Mt 11/19/2014, 11:57 AM  [place] MAIN [facility] SERVICES [address] [place], [place], [address] Phone: [phone]   Fax:  [phone]

## 2014-11-19 NOTE — Therapy (Signed)
[place] MAIN [facility] SERVICES [address] [place], [place], [address] Phone: [phone]   Fax:  [phone]  Physical Therapy Treatment  Patient Details  Name: [name] Ferguson MRN: [id number] Date of Birth: [birth year] Referring Provider:  Cletis Athens, MD  Encounter Date: 11/19/2014      PT End of Session - 11/19/14 1213    Visit Number 10   Number of Visits 16   Date for PT Re-Evaluation 12/16/14   PT Start Time 1110   PT Stop Time 1145   PT Time Calculation (min) 35 min   Equipment Utilized During Treatment Gait belt   Activity Tolerance Patient tolerated treatment well;No increased pain   Behavior During Therapy [facility] for tasks assessed/performed      Past Medical History  Diagnosis Date  . Diabetes mellitus without complication   . Hypertension   . CHF (congestive heart failure)   . Stroke     Past Surgical History  Procedure Laterality Date  . Joint replacement      Knee  . Back surgery    . Coronary artery bypass graft      There were no vitals filed for this visit.  Visit Diagnosis:  CVA (cerebral vascular accident)      Subjective Assessment - 11/19/14 1114    Subjective Patient reports he is preparing for a cruise for his wife, and has found tremendous benefit from strength training he has done on his own and is ready to be discharged. Patient would like to increase his strength, and has been going to the gym 5 times per week.    Pertinent History Pt had a CVA on 10/18/2014.   Limitations Other (comment)   Patient Stated Goals To improve balance.    Currently in Pain? No/denies       There-Ex 6MW Test - 1450 feet  TUG - 7.78 seconds 80mwalk speed - 1.23 m/s 5x sit to stand - 8.26 seconds   Leg extensions on machine 30# x 15 repetitions x 2 sets  -> cuing to hold a the top.  TRX assisted squats 1 x 10 -> patient instructed to keep neutral spine by keeping thoracic extension Leg Press 1 set x 50#, 1 set  x70# for 10 repetitions. Patient instructed to have 90 degree angle at the knee to begin exercise.                         PT Education - 11/19/14 1213    Education provided Yes   Education Details Patient educated on his progress, home exercise program.    Person(s) Educated Patient   Methods Explanation;Demonstration;Verbal cues   Comprehension Verbalized understanding;Returned demonstration             PT Long Term Goals - 11/19/14 1222    PT LONG TERM GOAL #1   Status Partially Met   PT LONG TERM GOAL #2   Status Achieved   PT LONG TERM GOAL #3   Status Achieved               Plan - 11/19/14 1215    Clinical Impression Statement Patient has made significant improvement in strength, speed, and balance since his initial presentaiton. Patient has improved gait speed (now 1.23 m/s), strength (5 x sit to stand in 8.26 seconds), and dynamic mobility (TUG in 7.78 seconds), as well as aerobic capacity (6MW test now 1450 feet).. Given his significan timprovements and his  consistency with going to the gym patient appears appropriate for discharge. No further skilled PT interventions necessary at this time.    Pt will benefit from skilled therapeutic intervention in order to improve on the following deficits Abnormal gait;Cardiopulmonary status limiting activity;Decreased balance;Pain;Decreased strength   Rehab Potential Good   Clinical Impairments Affecting Rehab Potential Age and history of CVA.    PT Frequency 2x / week   PT Duration 8 weeks   PT Treatment/Interventions Therapeutic exercise;Balance training;Neuromuscular re-education   PT Next Visit Plan Patient was discharged after session today.    PT Home Exercise Plan Provided in patient instructions.    Consulted and Agree with Plan of Care Patient          G-Codes - 2014/11/28 1223    Functional Assessment Tool Used 5x sit to stand, TUG, 6MW test, 50mwalk speed    Functional Limitation Mobility:  Walking and moving around   Mobility: Walking and Moving Around Current Status [phone] percent impaired, limited or restricted   Mobility: Walking and Moving Around Discharge Status [phone] percent impaired, limited or restricted      Problem List Patient Active Problem List   Diagnosis Date Noted  . DIABETES MELLITUS, TYPE II 05/15/2007  . HYPERLIPIDEMIA 05/15/2007  . HYPERTENSION 05/15/2007  . CORONARY ARTERY DISEASE 05/15/2007  . DYSPNEA ON EXERTION 05/15/2007  . TOBACCO ABUSE, HX OF 05/15/2007  . CORONARY ARTERY BYPASS GRAFT, HX OF 05/15/2007    PKerman Passey PT, DPT    5May 20, 2016 12:31 PM  [place] [address] [address]Phone: [phone]  Fax:  [phone]    PHYSICAL THERAPY DISCHARGE SUMMARY  Visits from Start of Care: 10  Current functional level related to goals / functional outcomes: Patient has returned to baseline.    Remaining deficits: Very mild RLE single leg balance deficits.    Education / Equipment: Patient provided with home exercise program instruction.  Plan: Patient agrees to discharge.  Patient goals were met. Patient is being discharged due to meeting the stated rehab goals.  ?????

## 2014-11-24 ENCOUNTER — Ambulatory Visit: Payer: Medicare Other | Admitting: Occupational Therapy

## 2014-11-24 ENCOUNTER — Ambulatory Visit: Payer: Medicare Other | Admitting: Physical Therapy

## 2014-11-26 ENCOUNTER — Ambulatory Visit: Payer: Medicare Other | Admitting: Occupational Therapy

## 2014-11-26 ENCOUNTER — Ambulatory Visit: Payer: Medicare Other | Admitting: Physical Therapy

## 2014-12-01 ENCOUNTER — Ambulatory Visit: Payer: Medicare Other | Admitting: Occupational Therapy

## 2014-12-01 ENCOUNTER — Ambulatory Visit: Payer: Medicare Other | Admitting: Physical Therapy

## 2014-12-03 ENCOUNTER — Ambulatory Visit: Payer: Medicare Other | Admitting: Occupational Therapy

## 2014-12-03 ENCOUNTER — Ambulatory Visit: Payer: Medicare Other | Admitting: Physical Therapy

## 2014-12-11 ENCOUNTER — Ambulatory Visit: Payer: Self-pay | Admitting: Physical Therapy

## 2014-12-11 ENCOUNTER — Ambulatory Visit: Payer: Medicare Other | Admitting: Occupational Therapy

## 2015-03-10 ENCOUNTER — Encounter: Payer: Self-pay | Admitting: Podiatry

## 2015-03-10 ENCOUNTER — Ambulatory Visit (INDEPENDENT_AMBULATORY_CARE_PROVIDER_SITE_OTHER): Payer: Medicare Other | Admitting: Podiatry

## 2015-03-10 DIAGNOSIS — M79676 Pain in unspecified toe(s): Secondary | ICD-10-CM

## 2015-03-10 DIAGNOSIS — B351 Tinea unguium: Secondary | ICD-10-CM | POA: Diagnosis not present

## 2015-03-10 DIAGNOSIS — E1140 Type 2 diabetes mellitus with diabetic neuropathy, unspecified: Secondary | ICD-10-CM

## 2015-03-10 DIAGNOSIS — E1149 Type 2 diabetes mellitus with other diabetic neurological complication: Secondary | ICD-10-CM

## 2015-03-10 DIAGNOSIS — M2040 Other hammer toe(s) (acquired), unspecified foot: Secondary | ICD-10-CM

## 2015-03-12 NOTE — Progress Notes (Signed)
Patient ID: [name] Ferguson, male   DOB: Nov 12, 1940, 74 y.o.   MRN: [id number]  Subjective: 74 y.o. returns the office today for painful, elongated, thickened toenails which he is unable to trim himself. Denies any redness or drainage around the nails. Denies any acute changes since last appointment and no new complaints today. Denies any systemic complaints such as fevers, chills, nausea, vomiting. He is also requesting diabetic shoes.   Objective: AAO 3, NAD DP/PT pulses palpable, CRT less than 3 seconds Protective sensation intact with Simms Weinstein monofilament, vibratory sensation decreased, Achilles tendon reflex intact.  Nails hypertrophic, dystrophic, elongated, brittle, discolored 10. There is tenderness overlying the nails 1-5 bilaterally. There is no surrounding erythema or drainage along the nail sites. No open lesions or pre-ulcerative lesions are identified. No other areas of tenderness bilateral lower extremities. No overlying edema, erythema, increased warmth. Hammertoe contractures.  No pain with calf compression, swelling, warmth, erythema.  Assessment: Patient presents with symptomatic onychomycosis  Plan: -Treatment options including alternatives, risks, complications were discussed -Nails sharply debrided 10 without complication/bleeding. -Discussed daily foot inspection. If there are any changes, to call the office immediately.  -Paper for diabetic shoes was completed today. -Follow-up in 3 months or sooner if any problems are to arise. In the meantime, encouraged to call the office with any questions, concerns, changes symptoms.  Celesta Gentile, DPM

## 2015-04-23 ENCOUNTER — Ambulatory Visit: Payer: Medicare Other

## 2015-05-21 ENCOUNTER — Ambulatory Visit (INDEPENDENT_AMBULATORY_CARE_PROVIDER_SITE_OTHER): Payer: Medicare Other | Admitting: Podiatry

## 2015-05-21 DIAGNOSIS — E1149 Type 2 diabetes mellitus with other diabetic neurological complication: Secondary | ICD-10-CM

## 2015-05-21 NOTE — Progress Notes (Signed)
Patient ID: [name] Ferguson, male   DOB: [birth year], 74 y.o.   MRN: [id number] Patient presents to be scanned and measured for diabetic shoes and inserts. He was seen by the medical assistant. Denies any acute changes since last appointment and was offered to be seen by myself, however he declined.

## 2015-06-11 ENCOUNTER — Ambulatory Visit: Payer: Medicare Other | Admitting: Podiatry

## 2015-06-16 ENCOUNTER — Telehealth: Payer: Self-pay | Admitting: Podiatry

## 2015-06-16 NOTE — Telephone Encounter (Signed)
No shoe paperwork  So no shoes    Ok to schedule for routine foot care

## 2015-06-16 NOTE — Telephone Encounter (Signed)
Pt called to set up an appt to pick up diabetic shoes and to have diabetic foot care/ debride. Are his shoes in so we can set up appt?

## 2015-07-09 ENCOUNTER — Ambulatory Visit (INDEPENDENT_AMBULATORY_CARE_PROVIDER_SITE_OTHER): Payer: Medicare Other | Admitting: Podiatry

## 2015-07-09 DIAGNOSIS — M2042 Other hammer toe(s) (acquired), left foot: Secondary | ICD-10-CM | POA: Diagnosis not present

## 2015-07-09 DIAGNOSIS — E1149 Type 2 diabetes mellitus with other diabetic neurological complication: Secondary | ICD-10-CM

## 2015-07-09 DIAGNOSIS — M2041 Other hammer toe(s) (acquired), right foot: Secondary | ICD-10-CM | POA: Diagnosis not present

## 2015-07-09 NOTE — Progress Notes (Signed)
Patient ID: [name] Ferguson, male   DOB: [birth year], 74 y.o.   MRN: [id number] Patient presents for diabetic shoe pick up, shoes are tried on for good fit.  Patient received 1 Pair Apex [id number] Athletic walker white lace in men's 10 wide and 3 pairs custom molded diabetic inserts.  Verbal and written break in and wear instructions given.  Patient will follow up for scheduled routine care. No new complaints today and no acute changes.

## 2015-07-09 NOTE — Patient Instructions (Signed)

## 2015-07-22 ENCOUNTER — Encounter: Payer: Self-pay | Admitting: *Deleted

## 2015-07-29 ENCOUNTER — Ambulatory Visit: Payer: Medicare Other | Admitting: General Surgery

## 2015-08-04 ENCOUNTER — Encounter: Payer: Self-pay | Admitting: General Surgery

## 2015-08-04 ENCOUNTER — Ambulatory Visit (INDEPENDENT_AMBULATORY_CARE_PROVIDER_SITE_OTHER): Payer: Medicare Other | Admitting: General Surgery

## 2015-08-04 VITALS — BP 130/82 | HR 84 | Resp 14 | Ht 68.0 in | Wt 216.0 lb

## 2015-08-04 DIAGNOSIS — K648 Other hemorrhoids: Secondary | ICD-10-CM | POA: Diagnosis not present

## 2015-08-04 NOTE — Patient Instructions (Addendum)
The patient is aware to call back for any questions or concerns. Hemorrhoids Hemorrhoids are swollen veins around the rectum or anus. There are two types of hemorrhoids:   Internal hemorrhoids. These occur in the veins just inside the rectum. They may poke through to the outside and become irritated and painful.  External hemorrhoids. These occur in the veins outside the anus and can be felt as a painful swelling or hard lump near the anus. CAUSES  Pregnancy.   Obesity.   Constipation or diarrhea.   Straining to have a bowel movement.   Sitting for long periods on the toilet.  Heavy lifting or other activity that caused you to strain.  Anal intercourse. SYMPTOMS   Pain.   Anal itching or irritation.   Rectal bleeding.   Fecal leakage.   Anal swelling.   One or more lumps around the anus.  DIAGNOSIS  Your caregiver may be able to diagnose hemorrhoids by visual examination. Other examinations or tests that may be performed include:   Examination of the rectal area with a gloved hand (digital rectal exam).   Examination of anal canal using a small tube (scope).   A blood test if you have lost a significant amount of blood.  A test to look inside the colon (sigmoidoscopy or colonoscopy). TREATMENT Most hemorrhoids can be treated at home. However, if symptoms do not seem to be getting better or if you have a lot of rectal bleeding, your caregiver may perform a procedure to help make the hemorrhoids get smaller or remove them completely. Possible treatments include:   Placing a rubber band at the base of the hemorrhoid to cut off the circulation (rubber band ligation).   Injecting a chemical to shrink the hemorrhoid (sclerotherapy).   Using a tool to burn the hemorrhoid (infrared light therapy).   Surgically removing the hemorrhoid (hemorrhoidectomy).   Stapling the hemorrhoid to block blood flow to the tissue (hemorrhoid stapling).  HOME CARE  INSTRUCTIONS   Eat foods with fiber, such as whole grains, beans, nuts, fruits, and vegetables. Ask your doctor about taking products with added fiber in them (fibersupplements).  Increase fluid intake. Drink enough water and fluids to keep your urine clear or pale yellow.   Exercise regularly.   Go to the bathroom when you have the urge to have a bowel movement. Do not wait.   Avoid straining to have bowel movements.   Keep the anal area dry and clean. Use wet toilet paper or moist towelettes after a bowel movement.   Medicated creams and suppositories may be used or applied as directed.   Only take over-the-counter or prescription medicines as directed by your caregiver.   Take warm sitz baths for 15-20 minutes, 3-4 times a day to ease pain and discomfort.   Place ice packs on the hemorrhoids if they are tender and swollen. Using ice packs between sitz baths may be helpful.   Put ice in a plastic bag.   Place a towel between your skin and the bag.   Leave the ice on for 15-20 minutes, 3-4 times a day.   Do not use a donut-shaped pillow or sit on the toilet for long periods. This increases blood pooling and pain.  SEEK MEDICAL CARE IF:  You have increasing pain and swelling that is not controlled by treatment or medicine.  You have uncontrolled bleeding.  You have difficulty or you are unable to have a bowel movement.  You have pain or inflammation outside  the area of the hemorrhoids. MAKE SURE YOU:  Understand these instructions.  Will watch your condition.  Will get help right away if you are not doing well or get worse.   This information is not intended to replace advice given to you by your health care provider. Make sure you discuss any questions you have with your health care provider.   Document Released: 06/24/2000 Document Revised: 06/13/2012 Document Reviewed: 05/01/2012 Elsevier Interactive Patient Education [employer].

## 2015-08-04 NOTE — Progress Notes (Signed)
Patient ID: [name] Ferguson, male   DOB: 12/21/1940, 75 y.o.   MRN: [id number]  No chief complaint on file.   HPI [name] Ferguson is a 75 y.o. male.  He is here today for hemorrhoid evaluation. He stats he has had hemorrhoid surgery in the past up until 1 year ago. No rectal pain. He has been noticing some rectal bleeding. The bleeding can last minutes to several hours. He does admit to diarrhea (wateray/loose). His bowels move at least once a day and some days he has more. He does notice that his diabetic medication causes some of the diarrhea for many years. He has been using hemorrhoid suppositories and does not think they have helped. No bleeding this week and he has watery/loose diarrhea today,  He has been to the bathroom 4 times already. I have reviewed the history of present illness with the patient. HPI  Past Medical History  Diagnosis Date  . Diabetes mellitus without complication ([place])   . Hypertension   . CHF (congestive heart failure) ([place])   . Stroke [facility])     Past Surgical History  Procedure Laterality Date  . Joint replacement Left 2004    Knee  . Back surgery    . Coronary artery bypass graft  1996  . Hemorrhoid surgery  2007    Family History  Problem Relation Age of Onset  . Diabetes Mother     Social History Social History  Substance Use Topics  . Smoking status: Former Smoker    Quit date: 07/11/1981  . Smokeless tobacco: Never Used  . Alcohol Use: 0.0 oz/week    0 Standard drinks or equivalent per week     Comment: occasionally    No Known Allergies  Current Outpatient Prescriptions  Medication Sig Dispense Refill  . aspirin 81 MG tablet Take 81 mg by mouth daily.    . carvedilol (COREG) 12.5 MG tablet     . CINNAMON PO Take by mouth.    . clopidogrel (PLAVIX) 75 MG tablet     . fenofibrate (TRICOR) 145 MG tablet     . glucosamine-chondroitin 500-400 MG tablet Take 1 tablet by mouth daily.    . insulin aspart (NOVOLOG) 100 UNIT/ML injection  Inject into the skin 3 (three) times daily with meals.    . Insulin Detemir (LEVEMIR FLEXTOUCH) 100 UNIT/ML Pen Inject into the skin.    Marland Kitchen isosorbide mononitrate (IMDUR) 30 MG 24 hr tablet Take 30 mg by mouth daily.     . Lecithin 1200 MG CAPS Take by mouth daily.    Marland Kitchen lisinopril (PRINIVIL,ZESTRIL) 40 MG tablet Take 20 mg by mouth daily.     . metFORMIN (GLUCOPHAGE) 1000 MG tablet 1,000 mg 2 (two) times daily with a meal.     . nitroGLYCERIN (NITROSTAT) 0.4 MG SL tablet Place under the tongue.    . Omega-3 Fatty Acids (FISH OIL) 1000 MG CAPS Take 2 capsules by mouth daily.    . pravastatin (PRAVACHOL) 40 MG tablet Take 40 mg by mouth daily.    Marland Kitchen spironolactone (ALDACTONE) 25 MG tablet Take 25 mg by mouth daily.    . tamsulosin (FLOMAX) 0.4 MG CAPS capsule Take by mouth.    . vitamin B-12 (CYANOCOBALAMIN) 500 MCG tablet Take 500 mcg by mouth daily.    . vitamin C (ASCORBIC ACID) 500 MG tablet Take 500 mg by mouth daily.     No current facility-administered medications for this visit.    Review of Systems  Review of Systems  Constitutional: Negative.   Respiratory: Negative.   Cardiovascular: Negative.   Gastrointestinal: Positive for diarrhea and anal bleeding. Negative for rectal pain.    Blood pressure 130/82, pulse 84, resp. rate 14, height 5\' 8"  (1.727 m), weight 216 lb (97.977 kg).  Physical Exam Physical Exam  Constitutional: He is oriented to person, place, and time. He appears well-developed and well-nourished.  HENT:  Mouth/Throat: Oropharynx is clear and moist.  Eyes: Conjunctivae are normal. No scleral icterus.  Neck: Neck supple.  Cardiovascular: Normal rate, regular rhythm and normal heart sounds.   Pulmonary/Chest: Effort normal and breath sounds normal.  Abdominal: Soft. Bowel sounds are normal. There is no tenderness.  Genitourinary: Rectal exam shows internal hemorrhoid (3 o'clock , 2cm size, likely source of bleeding. Grade 1). Prostate is enlarged ( smooth and  firm).  Prostate firm and enlarged  Lymphadenopathy:    He has no cervical adenopathy.  Neurological: He is alert and oriented to person, place, and time.  Skin: Skin is warm and dry.  Psychiatric: His behavior is normal.  Anoscopy was performed. No findings other than the prominent internal hemorrhoid at 3 ocl  Data Reviewed Note reviewed.  Assessment    Internal hemorrhoid 3 o'clock     Plan    Discussed banding of hemorrhoid vs. Internal hemorrhoidectomy. Patient to have hemorrhoidectomy-tis will allow for suturing the base of hemorrhoid to minmize bleeding.     PCP:  Cletis Athens This information has been scribed by Karie Fetch [place].    Karie Fetch M 08/04/2015, 1:36 PM

## 2015-08-05 ENCOUNTER — Telehealth: Payer: Self-pay | Admitting: Gastroenterology

## 2015-08-05 NOTE — Telephone Encounter (Signed)
colonoscopy

## 2015-08-06 ENCOUNTER — Telehealth: Payer: Self-pay

## 2015-08-06 ENCOUNTER — Other Ambulatory Visit: Payer: Self-pay

## 2015-08-06 NOTE — Telephone Encounter (Signed)
Gastroenterology Pre-Procedure Review  Request Date: 09/22/15 Requesting Physician: Dr. Lavera Guise  PATIENT REVIEW QUESTIONS: The patient responded to the following health history questions as indicated:    1. Are you having any GI issues? no 2. Do you have a personal history of Polyps? no 3. Do you have a family history of Colon Cancer or Polyps? no 4. 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 Joint replacements in the past 12 months?no 6. Major health problems in the past 3 months?no 7. 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 yes (Plavix 75mg ) Aspirin? yes (ASA 81mg )  Patient confirms/reports the following medications:  Current Outpatient Prescriptions  Medication Sig Dispense Refill  . aspirin 81 MG tablet Take 81 mg by mouth daily.    . carvedilol (COREG) 12.5 MG tablet     . CINNAMON PO Take by mouth.    . clopidogrel (PLAVIX) 75 MG tablet     . fenofibrate (TRICOR) 145 MG tablet     . glucosamine-chondroitin 500-400 MG tablet Take 1 tablet by mouth daily.    . insulin aspart (NOVOLOG) 100 UNIT/ML injection Inject into the skin 3 (three) times daily with meals.    . Insulin Detemir (LEVEMIR FLEXTOUCH) 100 UNIT/ML Pen Inject into the skin.    Marland Kitchen isosorbide mononitrate (IMDUR) 30 MG 24 hr tablet Take 30 mg by mouth daily.     . Lecithin 1200 MG CAPS Take by mouth daily.    Marland Kitchen lisinopril (PRINIVIL,ZESTRIL) 40 MG tablet Take 20 mg by mouth daily.     . metFORMIN (GLUCOPHAGE) 1000 MG tablet 1,000 mg 2 (two) times daily with a meal.     . nitroGLYCERIN (NITROSTAT) 0.4 MG SL tablet Place under the tongue.    . Omega-3 Fatty Acids (FISH OIL) 1000 MG CAPS Take 2 capsules by mouth daily.    . pravastatin (PRAVACHOL) 40 MG tablet Take 40 mg by mouth daily.    Marland Kitchen  spironolactone (ALDACTONE) 25 MG tablet Take 25 mg by mouth daily.    . tamsulosin (FLOMAX) 0.4 MG CAPS capsule Take by mouth.    . vitamin B-12 (CYANOCOBALAMIN) 500 MCG tablet Take 500 mcg by mouth daily.    . vitamin C (ASCORBIC ACID) 500 MG tablet Take 500 mg by mouth daily.     No current facility-administered medications for this visit.    Patient confirms/reports the following allergies:  No Known Allergies  No orders of the defined types were placed in this encounter.    AUTHORIZATION INFORMATION Primary Insurance: 1D#: Group #:  Secondary Insurance: 1D#: Group #:  SCHEDULE INFORMATION: Date: 09/22/15 Time: Location: [place]

## 2015-08-06 NOTE — Telephone Encounter (Signed)
Pt has colonoscopy on 09/22/15 at [facility]. Hx of colon polyps. (Z86.010) Instructs/rx mailed.

## 2015-08-12 ENCOUNTER — Encounter: Payer: Self-pay | Admitting: General Surgery

## 2015-08-12 ENCOUNTER — Ambulatory Visit (INDEPENDENT_AMBULATORY_CARE_PROVIDER_SITE_OTHER): Payer: Medicare Other | Admitting: General Surgery

## 2015-08-12 VITALS — BP 122/68 | HR 64 | Resp 14 | Ht 68.0 in | Wt 220.0 lb

## 2015-08-12 DIAGNOSIS — K649 Unspecified hemorrhoids: Secondary | ICD-10-CM | POA: Diagnosis not present

## 2015-08-12 HISTORY — PX: HEMORRHOID SURGERY: SHX153

## 2015-08-12 NOTE — Progress Notes (Signed)
This is a 75 year old male here today for hemorrhoid excision.  Procedure: Excision internal hemorrhoid  Anesthetic: 2 ml 1% xylocaine mixed with 0.5% marcaine  Prep: betadine  Description: Pt was placed in left lateral position. Anal area was prepped with betadine. Base of internal hemorrhoid at 3 ocl was infiltrated with local anesthetic. Base was suture ligated with 3-0 Vicryl and removed.  Procedure was well tolerated. No immediate problems from procedure.  Pt advised that he may still have minimal bleeding for a day or two.     Patient to return on two weeks.  This information has been scribed by Gaspar Cola CMA.  PCP:  Lavera Guise

## 2015-08-12 NOTE — Patient Instructions (Signed)
Patient to return on two week

## 2015-08-20 ENCOUNTER — Ambulatory Visit (INDEPENDENT_AMBULATORY_CARE_PROVIDER_SITE_OTHER): Payer: Medicare Other | Admitting: Podiatry

## 2015-08-20 ENCOUNTER — Encounter: Payer: Self-pay | Admitting: Podiatry

## 2015-08-20 DIAGNOSIS — B351 Tinea unguium: Secondary | ICD-10-CM | POA: Diagnosis not present

## 2015-08-20 DIAGNOSIS — M79676 Pain in unspecified toe(s): Secondary | ICD-10-CM | POA: Diagnosis not present

## 2015-08-20 DIAGNOSIS — E1149 Type 2 diabetes mellitus with other diabetic neurological complication: Secondary | ICD-10-CM | POA: Diagnosis not present

## 2015-08-20 NOTE — Progress Notes (Signed)
Patient ID: [name] Ferguson, male   DOB: [birth year], 75 y.o.   MRN: [id number]  Subjective: 75 y.o. returns the office today for  Diabetic risk assessment to check his shoes. He wears new diabetic shoes and they are not causing any irritation or any pain. Denies any swelling or redness. No recent open sores or pre-ulcerative lesions. He states that he is doing well. His last blood sugar this morning was 107. Denies any systemic complaints such as fevers, chills, nausea, vomiting. He is also requesting diabetic shoes.   Objective: AAO 3, NAD DP/PT pulses palpable, CRT less than 3 seconds Protective sensation intact with Simms Weinstein monofilament, vibratory sensation decreased Nails hypertrophic, dystrophic, elongated, brittle, discolored 10. There is tenderness overlying the nails 1-5 bilaterally. There is no surrounding erythema or drainage along the nail sites. No open lesions or pre-ulcerative lesions are identified. No other areas of tenderness bilateral lower extremities. No overlying edema, erythema, increased warmth. Hammertoe contractures.  No pain with calf compression, swelling, warmth, erythema.  Assessment: Patient presents with symptomatic onychomycosis  Plan: -Treatment options including alternatives, risks, complications were discussed -Nails sharply debrided 10 without complication/bleeding. -Discussed daily foot inspection. If there are any changes, to call the office immediately.  -Continue diabetic shoes. They are fitting well without any issues.  -Follow-up in 3 months or sooner if any problems are to arise. In the meantime, encouraged to call the office with any questions, concerns, changes symptoms.  Celesta Gentile, DPM

## 2015-08-26 ENCOUNTER — Encounter: Payer: Self-pay | Admitting: General Surgery

## 2015-08-26 ENCOUNTER — Ambulatory Visit (INDEPENDENT_AMBULATORY_CARE_PROVIDER_SITE_OTHER): Payer: Medicare Other | Admitting: General Surgery

## 2015-08-26 VITALS — BP 130/68 | HR 62 | Resp 12 | Ht 68.0 in | Wt 219.0 lb

## 2015-08-26 DIAGNOSIS — K648 Other hemorrhoids: Secondary | ICD-10-CM

## 2015-08-26 NOTE — Patient Instructions (Signed)
The patient is aware to call back for any questions or concerns.  

## 2015-08-26 NOTE — Progress Notes (Signed)
Here today for postoperative visit, excision internal hemorrhoids on 08-12-15. He states he is doing well. Minimal pain and no bleeding. Bowels are regular I have reviewed the history of present illness with the patient. The hemorrhoidectomy site at 3 o'clock is fully healed, no internal hemorrhoids noted.  Follow up as needed. The patient is aware to call back for any questions or concerns.   Marland KitchenPCP:  Cletis Athens This information has been scribed by Karie Fetch [place].

## 2015-09-04 ENCOUNTER — Other Ambulatory Visit: Payer: Self-pay

## 2015-09-04 DIAGNOSIS — R972 Elevated prostate specific antigen [PSA]: Secondary | ICD-10-CM

## 2015-09-07 ENCOUNTER — Other Ambulatory Visit: Payer: Self-pay | Admitting: Urology

## 2015-09-07 ENCOUNTER — Other Ambulatory Visit: Payer: Medicare Other

## 2015-09-07 ENCOUNTER — Encounter: Payer: Self-pay | Admitting: *Deleted

## 2015-09-07 DIAGNOSIS — R972 Elevated prostate specific antigen [PSA]: Secondary | ICD-10-CM

## 2015-09-08 LAB — PSA: Prostate Specific Ag, Serum: 2 ng/mL (ref 0.0–4.0)

## 2015-09-10 ENCOUNTER — Ambulatory Visit (INDEPENDENT_AMBULATORY_CARE_PROVIDER_SITE_OTHER): Payer: Medicare Other | Admitting: Urology

## 2015-09-10 ENCOUNTER — Encounter: Payer: Self-pay | Admitting: Urology

## 2015-09-10 VITALS — BP 121/63 | HR 54 | Ht 68.0 in | Wt 219.3 lb

## 2015-09-10 DIAGNOSIS — N138 Other obstructive and reflux uropathy: Secondary | ICD-10-CM

## 2015-09-10 DIAGNOSIS — N401 Enlarged prostate with lower urinary tract symptoms: Secondary | ICD-10-CM

## 2015-09-10 DIAGNOSIS — N400 Enlarged prostate without lower urinary tract symptoms: Secondary | ICD-10-CM | POA: Diagnosis not present

## 2015-09-10 LAB — BLADDER SCAN AMB NON-IMAGING

## 2015-09-10 MED ORDER — TAMSULOSIN HCL 0.4 MG PO CAPS: 0.4000 mg | ORAL_CAPSULE | Freq: Every day | ORAL | Status: DC

## 2015-09-10 NOTE — Progress Notes (Signed)
09/10/2015 12:04 PM   [name] Ferguson [birth year] [id number]  Referring provider: Cletis Athens, MD [address] [place], [place] [address]  Chief Complaint  Patient presents with  . Benign Prostatic Hypertrophy    1year    HPI: Patient is 75 year old Caucasian male who presents today for a 1 year follow-up for BPH with LUTS.  BPH WITH LUTS His IPSS score today is 9, which is moderate lower urinary tract symptomatology. He is pleased with his quality life due to his urinary symptoms. His PVR is 20 mL.  He denies any dysuria, hematuria or suprapubic pain.  He currently taking tamsulosin 0.4 mg daily.  He denies any recent fevers, chills, nausea or vomiting.  He does not have a family history of PCa.      IPSS      09/10/15 1100       International Prostate Symptom Score   How often have you had the sensation of not emptying your bladder? Less than 1 in 5     How often have you had to urinate less than every two hours? Less than half the time     How often have you found you stopped and started again several times when you urinated? Not at All     How often have you found it difficult to postpone urination? Less than half the time     How often have you had a weak urinary stream? Less than half the time     How often have you had to strain to start urination? Less than 1 in 5 times     How many times did you typically get up at night to urinate? 1 Time     Total IPSS Score 9     Quality of Life due to urinary symptoms   If you were to spend the rest of your life with your urinary condition just the way it is now how would you feel about that? Pleased        Score:  1-7 Mild 8-19 Moderate 20-35 Severe    PMH: Past Medical History  Diagnosis Date  . Diabetes mellitus without complication ([place])   . Hypertension   . CHF (congestive heart failure) ([place])   . Stroke ([place])   . Obesity   . HLD (hyperlipidemia)   . SOB (shortness of breath)   . Angina pectoris ([place])   .  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 BPH (benign prostatic hypertrophy) with urinary obstruction   . Penile adhesions     Surgical History: Past Surgical History  Procedure Laterality Date  . Joint replacement Left 2004    Knee  . Back surgery    . Coronary artery bypass graft  1996  . Hemorrhoid surgery  2007  . Colonoscopy    . Hemorrhoid surgery  08-12-15    excision internal hemorrhoid Dr Jamal Collin    Home Medications:    Medication List       This list is accurate as of: 09/10/15 12:04 PM.  Always use your most recent med list.               aspirin 81 MG tablet  Take 81 mg by mouth daily.     carvedilol 12.5 MG tablet  Commonly known as:  COREG     CINNAMON PO  Take by mouth.     clopidogrel 75 MG tablet  Commonly known as:  PLAVIX     fenofibrate 145 MG  tablet  Commonly known as:  TRICOR     Fish Oil 1000 MG Caps  Take 2 capsules by mouth daily.     glucosamine-chondroitin 500-400 MG tablet  Take 1 tablet by mouth daily.     insulin aspart 100 UNIT/ML injection  Commonly known as:  novoLOG  Inject into the skin 3 (three) times daily with meals.     isosorbide mononitrate 30 MG 24 hr tablet  Commonly known as:  IMDUR  Take 30 mg by mouth daily.     Lecithin 1200 MG Caps  Take by mouth daily.     LEVEMIR FLEXTOUCH 100 UNIT/ML Pen  Generic drug:  Insulin Detemir  Inject into the skin.     lisinopril 40 MG tablet  Commonly known as:  PRINIVIL,ZESTRIL  Take 20 mg by mouth daily.     metFORMIN 1000 MG tablet  Commonly known as:  GLUCOPHAGE  1,000 mg 2 (two) times daily with a meal.     nitroGLYCERIN 0.4 MG SL tablet  Commonly known as:  NITROSTAT  Place under the tongue.     pravastatin 40 MG tablet  Commonly known as:  PRAVACHOL  Take 40 mg by mouth daily.     spironolactone 25 MG tablet  Commonly known as:  ALDACTONE  Take 25 mg by mouth daily.     tamsulosin 0.4 MG Caps capsule  Commonly known as:  FLOMAX  Take 1 capsule  (0.4 mg total) by mouth daily.     vitamin B-12 500 MCG tablet  Commonly known as:  CYANOCOBALAMIN  Take 500 mcg by mouth daily.     vitamin C 500 MG tablet  Commonly known as:  ASCORBIC ACID  Take 500 mg by mouth daily.        Allergies: No Known Allergies  Family History: Family History  Problem Relation Age of Onset  . Diabetes Mother   . Cancer Sister   . Alzheimer's disease Mother   . Heart disease      Family members in general  . Diabetes Brother     Social History:  reports that he quit smoking about 34 years ago. He has never used smokeless tobacco. He reports that he drinks alcohol. He reports that he does not use illicit drugs.  ROS: UROLOGY Frequent Urination?: No Hard to postpone urination?: No Burning/pain with urination?: No Get up at night to urinate?: No Leakage of urine?: No Urine stream starts and stops?: No Trouble starting stream?: No Do you have to strain to urinate?: No Blood in urine?: No Urinary tract infection?: No Sexually transmitted disease?: No Injury to kidneys or bladder?: No Painful intercourse?: No Weak stream?: No Erection problems?: No Penile pain?: No  Gastrointestinal Nausea?: No Vomiting?: No Indigestion/heartburn?: No Diarrhea?: Yes Constipation?: No  Constitutional Fever: No Night sweats?: No Weight loss?: No Fatigue?: Yes  Skin Skin rash/lesions?: No Itching?: Yes  Eyes Blurred vision?: No Double vision?: No  Ears/Nose/Throat Sore throat?: No Sinus problems?: No  Hematologic/Lymphatic Swollen glands?: No Easy bruising?: Yes  Cardiovascular Leg swelling?: Yes Chest pain?: No  Respiratory Cough?: No Shortness of breath?: Yes  Endocrine Excessive thirst?: No  Musculoskeletal Back pain?: No Joint pain?: Yes  Neurological Headaches?: No Dizziness?: Yes  Psychologic Depression?: No Anxiety?: No  Physical Exam: BP 121/63 mmHg  Pulse 54  Ht 5\' 8"  (1.727 m)  Wt 219 lb 4.8 oz (99.474  kg)  BMI 33.35 kg/m2  Constitutional: Well nourished. Alert and oriented, No acute distress. HEENT:  [place] AT, moist mucus membranes. Trachea midline, no masses. Cardiovascular: No clubbing, cyanosis, or edema. Respiratory: Normal respiratory effort, no increased work of breathing. GI: Abdomen is soft, non tender, non distended, no abdominal masses. Liver and spleen not palpable.  No hernias appreciated.  Stool sample for occult testing is not indicated.   GU: No CVA tenderness.  No bladder fullness or masses.  Patient with uncircumcised phallus. Foreskin easily retracted  Urethral meatus is patent.  No penile discharge. No penile lesions or rashes. Scrotum without lesions, cysts, rashes and/or edema.  Testicles are located scrotally bilaterally. No masses are appreciated in the testicles. Left and right epididymis are normal. Rectal: Patient with  normal sphincter tone. Anus and perineum without scarring or rashes. No rectal masses are appreciated. Prostate is approximately 40 grams, no nodules are appreciated. Seminal vesicles are normal. Skin: No rashes, bruises or suspicious lesions. Lymph: No cervical or inguinal adenopathy. Neurologic: Grossly intact, no focal deficits, moving all 4 extremities. Psychiatric: Normal mood and affect.  Laboratory Data: PSA History  2.0 ng/mL on 09/07/2015   Pertinent Imaging: Results for [name] Ferguson, [name] Ferguson (MRN [id number]) as of 09/22/2015 23:41  Ref. Range 09/10/2015 11:18  Scan Result Unknown 55ml    Assessment & Plan:    1. BPH (benign prostatic hyperplasia) with LUTS:    IPSS score is 9/1.  He will continue the tamsulosin 0.4 mg daily.   He will have his IPSS score, exam and PSA in 12 months.   - BLADDER SCAN AMB NON-IMAGING   Return in about 1 year (around 09/09/2016) for IPSS score and exam.  These notes generated with voice recognition software. I apologize for typographical errors.  Zara Council, [facility] [address] [place], [place] [address] [phone]

## 2015-09-14 ENCOUNTER — Telehealth: Payer: Self-pay

## 2015-09-14 NOTE — Telephone Encounter (Signed)
Pt notified to stop his Plavix 5 days prior to his colonoscopy and restart 2 days after per Dr. Yvone Neu. Pt was concerned about this as a nurse at his office stated he needed to be on the Plavix for 1 year before stopping and that would be April. Pt will contact Dr. Elam Dutch office to confirm this. Pt's colonoscopy is scheduled for 09/22/15. See Blood thinner request in Media.

## 2015-09-21 ENCOUNTER — Encounter: Payer: Self-pay | Admitting: *Deleted

## 2015-09-22 ENCOUNTER — Ambulatory Visit: Payer: Medicare Other | Admitting: Anesthesiology

## 2015-09-22 ENCOUNTER — Ambulatory Visit
Admission: RE | Admit: 2015-09-22 | Discharge: 2015-09-22 | Disposition: A | Discharge status: Home / Self Care | Payer: Medicare Other | Source: Ambulatory Visit | Attending: Gastroenterology | Admitting: Gastroenterology

## 2015-09-22 ENCOUNTER — Encounter
Admission: RE | Disposition: A | Discharge status: Home / Self Care | Payer: Self-pay | Source: Ambulatory Visit | Attending: Gastroenterology

## 2015-09-22 DIAGNOSIS — Z8249 Family history of ischemic heart disease and other diseases of the circulatory system: Secondary | ICD-10-CM | POA: Diagnosis not present

## 2015-09-22 DIAGNOSIS — G4730 Sleep apnea, unspecified: Secondary | ICD-10-CM | POA: Diagnosis not present

## 2015-09-22 DIAGNOSIS — I209 Angina pectoris, unspecified: Secondary | ICD-10-CM | POA: Diagnosis not present

## 2015-09-22 DIAGNOSIS — Z833 Family history of diabetes mellitus: Secondary | ICD-10-CM | POA: Diagnosis not present

## 2015-09-22 DIAGNOSIS — E785 Hyperlipidemia, unspecified: Secondary | ICD-10-CM | POA: Diagnosis not present

## 2015-09-22 DIAGNOSIS — N401 Enlarged prostate with lower urinary tract symptoms: Principal | ICD-10-CM

## 2015-09-22 DIAGNOSIS — Z87891 Personal history of nicotine dependence: Secondary | ICD-10-CM | POA: Diagnosis not present

## 2015-09-22 DIAGNOSIS — I10 Essential (primary) hypertension: Secondary | ICD-10-CM | POA: Diagnosis not present

## 2015-09-22 DIAGNOSIS — I509 Heart failure, unspecified: Secondary | ICD-10-CM | POA: Diagnosis not present

## 2015-09-22 DIAGNOSIS — Z951 Presence of aortocoronary bypass graft: Secondary | ICD-10-CM | POA: Diagnosis not present

## 2015-09-22 DIAGNOSIS — I70219 Atherosclerosis of native arteries of extremities with intermittent claudication, unspecified extremity: Secondary | ICD-10-CM | POA: Insufficient documentation

## 2015-09-22 DIAGNOSIS — Z820 Family history of epilepsy and other diseases of the nervous system: Secondary | ICD-10-CM | POA: Diagnosis not present

## 2015-09-22 DIAGNOSIS — E119 Type 2 diabetes mellitus without complications: Secondary | ICD-10-CM | POA: Insufficient documentation

## 2015-09-22 DIAGNOSIS — K640 First degree hemorrhoids: Secondary | ICD-10-CM | POA: Insufficient documentation

## 2015-09-22 DIAGNOSIS — Z809 Family history of malignant neoplasm, unspecified: Secondary | ICD-10-CM | POA: Diagnosis not present

## 2015-09-22 DIAGNOSIS — Z1211 Encounter for screening for malignant neoplasm of colon: Secondary | ICD-10-CM | POA: Diagnosis not present

## 2015-09-22 DIAGNOSIS — E669 Obesity, unspecified: Secondary | ICD-10-CM | POA: Diagnosis not present

## 2015-09-22 DIAGNOSIS — N138 Other obstructive and reflux uropathy: Secondary | ICD-10-CM | POA: Insufficient documentation

## 2015-09-22 DIAGNOSIS — Z794 Long term (current) use of insulin: Secondary | ICD-10-CM | POA: Diagnosis not present

## 2015-09-22 DIAGNOSIS — Z8719 Personal history of other diseases of the digestive system: Secondary | ICD-10-CM | POA: Insufficient documentation

## 2015-09-22 DIAGNOSIS — Z8673 Personal history of transient ischemic attack (TIA), and cerebral infarction without residual deficits: Secondary | ICD-10-CM | POA: Insufficient documentation

## 2015-09-22 DIAGNOSIS — R0602 Shortness of breath: Secondary | ICD-10-CM | POA: Insufficient documentation

## 2015-09-22 DIAGNOSIS — K5730 Diverticulosis of large intestine without perforation or abscess without bleeding: Secondary | ICD-10-CM | POA: Diagnosis not present

## 2015-09-22 DIAGNOSIS — Z7982 Long term (current) use of aspirin: Secondary | ICD-10-CM | POA: Diagnosis not present

## 2015-09-22 DIAGNOSIS — N400 Enlarged prostate without lower urinary tract symptoms: Secondary | ICD-10-CM | POA: Insufficient documentation

## 2015-09-22 DIAGNOSIS — Z96652 Presence of left artificial knee joint: Secondary | ICD-10-CM | POA: Insufficient documentation

## 2015-09-22 DIAGNOSIS — K621 Rectal polyp: Secondary | ICD-10-CM | POA: Diagnosis not present

## 2015-09-22 DIAGNOSIS — Z86010 Personal history of colonic polyps: Secondary | ICD-10-CM | POA: Insufficient documentation

## 2015-09-22 HISTORY — PX: COLONOSCOPY WITH PROPOFOL: SHX5780

## 2015-09-22 HISTORY — DX: Sleep apnea, unspecified: G47.30

## 2015-09-22 SURGERY — COLONOSCOPY WITH PROPOFOL
Anesthesia: General

## 2015-09-22 MED ORDER — LACTATED RINGERS IV SOLN
INTRAVENOUS | Status: DC | PRN
  Administered 2015-09-22: 08:00:00 via INTRAVENOUS

## 2015-09-22 MED ORDER — MIDAZOLAM HCL 2 MG/2ML IJ SOLN
INTRAMUSCULAR | Status: DC | PRN
  Administered 2015-09-22: 1 mg via INTRAVENOUS

## 2015-09-22 MED ORDER — SODIUM CHLORIDE 0.9 % IV SOLN
INTRAVENOUS | Status: DC
  Administered 2015-09-22: 1000 mL via INTRAVENOUS

## 2015-09-22 MED ORDER — PROPOFOL 500 MG/50ML IV EMUL
INTRAVENOUS | Status: DC | PRN
Start: 2015-09-22 — End: 2015-09-22
  Administered 2015-09-22: 75 ug/kg/min via INTRAVENOUS

## 2015-09-22 MED ORDER — EPHEDRINE SULFATE 50 MG/ML IJ SOLN
INTRAMUSCULAR | Status: DC | PRN
  Administered 2015-09-22 (×2): 15 mg via INTRAVENOUS

## 2015-09-22 MED ORDER — PROPOFOL 10 MG/ML IV BOLUS
INTRAVENOUS | Status: DC | PRN
  Administered 2015-09-22: 30 mg via INTRAVENOUS

## 2015-09-22 NOTE — Transfer of Care (Signed)
Immediate Anesthesia Transfer of Care Note  Patient: [name] Ferguson  Procedure(s) Performed: Procedure(s): COLONOSCOPY WITH PROPOFOL (N/A)  Patient Location: PACU  Anesthesia Type:General  Level of Consciousness: sedated  Airway & Oxygen Therapy: Patient Spontanous Breathing and Patient connected to nasal cannula oxygen  Post-op Assessment: Report given to RN and Post -op Vital signs reviewed and stable  Post vital signs: Reviewed and stable  Last Vitals:  Filed Vitals:   09/22/15 0713 09/22/15 0834  BP: 120/60   Pulse: 54 63  Temp: 36.4 C 36.2 C  Resp: 18 22    Complications: No apparent anesthesia complications

## 2015-09-22 NOTE — Op Note (Signed)
[facility] Gastroenterology Patient Name: [name] Procedure Date: 09/22/2015 8:11 AM MRN: [id number] Account #: [id number] Date of Birth: 06-Jan-1941 Admit Type: Outpatient Age: 75 Room: [facility] ENDO ROOM 4 Gender: Male Note Status: Finalized Procedure:            Colonoscopy Indications:          Screening for colorectal malignant neoplasm Providers:            Lucilla Lame, MD Referring MD:         Cletis [place], MD (Referring MD) Medicines:            Propofol per Anesthesia Complications:        No immediate complications. Procedure:            Pre-Anesthesia Assessment:                       - Prior to the procedure, a History and Physical was                        performed, and patient medications and allergies were                        reviewed. The patient's tolerance of previous                        anesthesia was also reviewed. The risks and benefits of                        the procedure and the sedation options and risks were                        discussed with the patient. All questions were                        answered, and informed consent was obtained. Prior                        Anticoagulants: The patient has taken no previous                        anticoagulant or antiplatelet agents. ASA Grade                        Assessment: II - A patient with mild systemic disease.                        After reviewing the risks and benefits, the patient was                        deemed in satisfactory condition to undergo the                        procedure.                       After obtaining informed consent, the colonoscope was                        passed under direct vision. Throughout the procedure,  the patient's blood pressure, pulse, and oxygen                        saturations were monitored continuously. The                        Colonoscope was introduced through the anus and   advanced to the the cecum, identified by appendiceal                        orifice and ileocecal valve. The colonoscopy was                        performed without difficulty. The patient tolerated the                        procedure well. The quality of the bowel preparation                        was excellent. Findings:      The perianal and digital rectal examinations were normal.      A 4 mm polyp was found in the rectum. The polyp was sessile. The polyp       was removed with a jumbo cold forceps. Resection and retrieval were       complete.      Multiple small-mouthed diverticula were found in the sigmoid colon and       descending colon.      Non-bleeding internal hemorrhoids were found during retroflexion. The       hemorrhoids were Grade I (internal hemorrhoids that do not prolapse). Impression:           - One 4 mm polyp in the rectum, removed with a jumbo                        cold forceps. Resected and retrieved.                       - Diverticulosis in the sigmoid colon and in the                        descending colon.                       - Non-bleeding internal hemorrhoids. Recommendation:       - Await pathology results. Procedure Code(s):    --- Professional ---                       [phone], Colonoscopy, flexible; with biopsy, single or                        multiple Diagnosis Code(s):    --- Professional ---                       Z12.11, Encounter for screening for malignant neoplasm                        of colon                       K62.1, Rectal polyp CPT copyright 2016 American [employer] Association. All  rights reserved. The codes documented in this report are preliminary and upon coder review may  be revised to meet current compliance requirements. Lucilla Lame, MD 09/22/2015 8:29:36 AM This report has been signed electronically. Number of Addenda: 0 Note Initiated On: 09/22/2015 8:11 AM Scope Withdrawal Time: 0 hours 6 minutes 3 seconds  Total  Procedure Duration: 0 hours 9 minutes 18 seconds       [facility]

## 2015-09-22 NOTE — H&P (Signed)
[name]  [address] [name] [address] Phone: [phone] Fax : [phone]  Primary Care Physician:  Cletis Athens, [name] Primary Gastroenterologist:  Dr. Allen Norris  Pre-Procedure History & Physical: HPI:  [name] is a 75 y.o. male is here for an colonoscopy.   Past Ferguson History  Diagnosis Date  . Diabetes mellitus without complication ([place])   . Hypertension   . CHF (congestive heart failure) ([place])   . Stroke ([place])   . Obesity   . HLD (hyperlipidemia)   . SOB (shortness of breath)   . Angina pectoris ([place])   . Atherosclerosis with limb claudication ([place])   . BPH (benign prostatic hypertrophy) with urinary obstruction   . Penile adhesions   . Sleep apnea     Past Surgical History  Procedure Laterality Date  . Joint replacement Left 2004    Knee  . Back surgery    . Coronary artery bypass graft  1996  . Hemorrhoid surgery  2007  . Colonoscopy    . Hemorrhoid surgery  08-12-15    excision internal hemorrhoid Dr Jamal Collin  . Cardiac catheterization      Prior to Admission medications   Medication Sig Start Date End Date Taking? Authorizing Provider  clopidogrel (PLAVIX) 75 MG tablet  06/02/13  Yes Historical Provider, [name]  aspirin 81 MG tablet Take 81 mg by mouth daily.    Historical Provider, [name]  carvedilol (COREG) 12.5 MG tablet  07/14/13   Historical Provider, [name]  CINNAMON PO Take by mouth.    Historical Provider, [name]  fenofibrate (TRICOR) 145 MG tablet  08/02/13   Historical Provider, [name]  glucosamine-chondroitin 500-400 MG tablet Take 1 tablet by mouth daily.    Historical Provider, [name]  insulin aspart (NOVOLOG) 100 UNIT/ML injection Inject into the skin 3 (three) times daily with meals.    Historical Provider, [name]  Insulin Detemir (LEVEMIR FLEXTOUCH) 100 UNIT/ML Pen Inject into the skin. 01/05/15   Historical Provider, [name]  isosorbide mononitrate (IMDUR) 30 MG 24 hr tablet Take 30 mg by mouth daily.  06/10/13   Historical Provider, [name]    Lecithin 1200 MG CAPS Take by mouth daily.    Historical Provider, [name]  lisinopril (PRINIVIL,ZESTRIL) 40 MG tablet Take 20 mg by mouth daily.  06/02/13   Historical Provider, [name]  metFORMIN (GLUCOPHAGE) 1000 MG tablet 1,000 mg 2 (two) times daily with a meal.  06/10/13   Historical Provider, [name]  nitroGLYCERIN (NITROSTAT) 0.4 MG SL tablet Place under the tongue. 10/30/14   Historical Provider, [name]  Omega-3 Fatty Acids (FISH OIL) 1000 MG CAPS Take 2 capsules by mouth daily.    Historical Provider, [name]  pravastatin (PRAVACHOL) 40 MG tablet Take 40 mg by mouth daily.    Historical Provider, [name]  spironolactone (ALDACTONE) 25 MG tablet Take 25 mg by mouth daily.    Historical Provider, [name]  tamsulosin (FLOMAX) 0.4 MG CAPS capsule Take 1 capsule (0.4 mg total) by mouth daily. 09/10/15   Nori Riis, PA-C  vitamin B-12 (CYANOCOBALAMIN) 500 MCG tablet Take 500 mcg by mouth daily.    Historical Provider, [name]  vitamin C (ASCORBIC ACID) 500 MG tablet Take 500 mg by mouth daily.    Historical Provider, [name]    Allergies as of 08/06/2015  . (No Known Allergies)    Family History  Problem Relation Age of Onset  . Diabetes Mother   . Cancer Sister   . Alzheimer's disease Mother   . Heart disease  Family members in general  . Diabetes Brother     Social History   Social History  . Marital Status: Married    Spouse Name: N/A  . Number of Children: N/A  . Years of Education: N/A   Occupational History  . Not on file.   Social History Main Topics  . Smoking status: Former Smoker    Quit date: 07/11/1981  . Smokeless tobacco: Never Used  . Alcohol Use: 0.0 oz/week    0 Standard drinks or equivalent per week     Comment: occasionally  . Drug Use: No  . Sexual Activity: Not on file   Other Topics Concern  . Not on file   Social History Narrative    Review of Systems: See HPI, otherwise negative ROS  Physical Exam: BP 120/60 mmHg  Pulse 54  Temp(Src) 97.6 F (36.4 C) (Tympanic)   Resp 18  SpO2 100% General:   Alert,  pleasant and cooperative in NAD Head:  Normocephalic and atraumatic. Neck:  Supple; no masses or thyromegaly. Lungs:  Clear throughout to auscultation.    Heart:  Regular rate and rhythm. Abdomen:  Soft, nontender and nondistended. Normal bowel sounds, without guarding, and without rebound.   Neurologic:  Alert and  oriented x4;  grossly normal neurologically.  Impression/Plan: [name] is here for an colonoscopy to be performed for screening  Risks, benefits, limitations, and alternatives regarding  colonoscopy have been reviewed with the patient.  Questions have been answered.  All parties agreeable.   [name], [name]  09/22/2015, 7:43 AM

## 2015-09-22 NOTE — Transfer of Care (Signed)
Immediate Anesthesia Transfer of Care Note  Patient: [name]  Procedure(s) Performed: Procedure(s): COLONOSCOPY WITH PROPOFOL (N/A)  Patient Location: PACU  Anesthesia Type:General  Level of Consciousness: sedated  Airway & Oxygen Therapy: Patient Spontanous Breathing and Patient connected to nasal cannula oxygen  Post-op Assessment: Report given to RN and Post -op Vital signs reviewed and stable  Post vital signs: Reviewed and stable  Last Vitals:  Filed Vitals:   09/22/15 0713  BP: 120/60  Pulse: 54  Temp: 36.4 C  Resp: 18    Complications: No apparent anesthesia complications

## 2015-09-22 NOTE — Anesthesia Postprocedure Evaluation (Signed)
Anesthesia Post Note  Patient: [name]  Procedure(s) Performed: Procedure(s) (LRB): COLONOSCOPY WITH PROPOFOL (N/A)  Patient location during evaluation: PACU Anesthesia Type: General Level of consciousness: awake and alert Pain management: pain level controlled Vital Signs Assessment: post-procedure vital signs reviewed and stable Respiratory status: spontaneous breathing, nonlabored ventilation, respiratory function stable and patient connected to nasal cannula oxygen Cardiovascular status: blood pressure returned to baseline and stable Postop Assessment: no signs of nausea or vomiting Anesthetic complications: no    Last Vitals:  Filed Vitals:   09/22/15 0713 09/22/15 0834  BP: 120/60 106/44  Pulse: 54 63  Temp: 36.4 C 36.2 C  Resp: 18 22    Last Pain: There were no vitals filed for this visit.               Molli Barrows

## 2015-09-22 NOTE — Anesthesia Preprocedure Evaluation (Signed)
Anesthesia Evaluation  Patient identified by MRN, date of birth, ID band Patient awake    Reviewed: Allergy & Precautions, H&P , NPO status , Patient's Chart, lab work & pertinent test results, reviewed documented beta blocker date and time   Airway Mallampati: III   Neck ROM: full    Dental  (+) Teeth Intact   Pulmonary neg pulmonary ROS, neg shortness of breath, sleep apnea and Continuous Positive Airway Pressure Ventilation , former smoker,    Pulmonary exam normal        Cardiovascular hypertension, (-) angina+ CAD, + Past MI and +CHF  (-) DOE negative cardio ROS Normal cardiovascular exam     Neuro/Psych CVA, No Residual Symptoms negative neurological ROS  negative psych ROS   GI/Hepatic negative GI ROS, Neg liver ROS,   Endo/Other  negative endocrine ROSdiabetes  Renal/GU negative Renal ROS  negative genitourinary   Musculoskeletal   Abdominal   Peds  Hematology negative hematology ROS (+)   Anesthesia Other Findings Past Medical History:   Diabetes mellitus without complication (HCC)                 Hypertension                                                 CHF (congestive heart failure) (HCC)                         Stroke (HCC)                                                 Obesity                                                      HLD (hyperlipidemia)                                         SOB (shortness of breath)                                    Angina pectoris (HCC)                                        Atherosclerosis with limb claudication (HCC)                 BPH (benign prostatic hypertrophy) with urinar*              Penile adhesions                                             Sleep apnea  Past Surgical History:   JOINT REPLACEMENT                               Left 2004           Comment:Knee   BACK SURGERY                                                   CORONARY ARTERY BYPASS GRAFT                     1996         HEMORRHOID SURGERY                               2007         COLONOSCOPY                                                   HEMORRHOID SURGERY                               08-12-15         Comment:excision internal hemorrhoid Dr Jamal Collin   CARDIAC CATHETERIZATION                                       Reproductive/Obstetrics                             Anesthesia Physical Anesthesia Plan  ASA: III  Anesthesia Plan: General   Post-op Pain Management:    Induction:   Airway Management Planned:   Additional Equipment:   Intra-op Plan:   Post-operative Plan:   Informed Consent: I have reviewed the patients History and Physical, chart, labs and discussed the procedure including the risks, benefits and alternatives for the proposed anesthesia with the patient or authorized representative who has indicated his/her understanding and acceptance.   Dental Advisory Given  Plan Discussed with: CRNA  Anesthesia Plan Comments: (Took have glucotrol last pm and bs 98 at home)        Anesthesia Quick Evaluation

## 2015-09-23 LAB — SURGICAL PATHOLOGY

## 2015-09-28 ENCOUNTER — Encounter: Payer: Self-pay | Admitting: Gastroenterology

## 2015-11-17 ENCOUNTER — Encounter: Payer: Self-pay | Admitting: Podiatry

## 2015-11-17 ENCOUNTER — Ambulatory Visit (INDEPENDENT_AMBULATORY_CARE_PROVIDER_SITE_OTHER): Payer: Medicare Other | Admitting: Podiatry

## 2015-11-17 DIAGNOSIS — M79676 Pain in unspecified toe(s): Secondary | ICD-10-CM | POA: Diagnosis not present

## 2015-11-17 DIAGNOSIS — B351 Tinea unguium: Secondary | ICD-10-CM

## 2015-11-17 DIAGNOSIS — E1149 Type 2 diabetes mellitus with other diabetic neurological complication: Secondary | ICD-10-CM

## 2015-11-17 NOTE — Progress Notes (Signed)
Patient ID: [name], male   DOB: 1940-07-19, 75 y.o.   MRN: [id number]  Subjective: 75 y.o. returns the office today for painful, elongated, painful toenails that he cannot trim himself. No surrounding redness or drainage. No open sores. No other complaints at this time and no acute changes.   Objective: AAO 3, NAD DP/PT pulses palpable, CRT less than 3 seconds Protective sensation intact with Simms Weinstein monofilament, vibratory sensation decreased Nails hypertrophic, dystrophic, elongated, brittle, discolored 10. There is tenderness overlying the nails 1-5 bilaterally. There is no surrounding erythema or drainage along the nail sites. No open lesions or pre-ulcerative lesions are identified. No other areas of tenderness bilateral lower extremities. No overlying edema, erythema, increased warmth. Hammertoe contractures.  No pain with calf compression, swelling, warmth, erythema.  Assessment: Patient presents with symptomatic onychomycosis  Plan: -Treatment options including alternatives, risks, complications were discussed -Nails sharply debrided 10 without complication/bleeding. -Discussed daily foot inspection. If there are any changes, to call the office immediately.  -Follow-up in 3 months or sooner if any problems are to arise. In the meantime, encouraged to call the office with any questions, concerns, changes symptoms.  Celesta Gentile, DPM

## 2016-02-23 ENCOUNTER — Encounter: Payer: Self-pay | Admitting: Podiatry

## 2016-02-23 ENCOUNTER — Ambulatory Visit (INDEPENDENT_AMBULATORY_CARE_PROVIDER_SITE_OTHER): Payer: Medicare Other | Admitting: Podiatry

## 2016-02-23 DIAGNOSIS — M79676 Pain in unspecified toe(s): Secondary | ICD-10-CM

## 2016-02-23 DIAGNOSIS — B351 Tinea unguium: Secondary | ICD-10-CM | POA: Diagnosis not present

## 2016-02-23 DIAGNOSIS — E1149 Type 2 diabetes mellitus with other diabetic neurological complication: Secondary | ICD-10-CM

## 2016-02-23 NOTE — Progress Notes (Signed)
Patient ID: [name], male   DOB: 1941-06-05, 75 y.o.   MRN: [id number]  Subjective: 75 y.o. returns the office today for painful, elongated, painful toenails that he cannot trim himself. No surrounding redness or drainage. No open sores. No other complaints at this time and no acute changes. Denies any recent injury or trauma to his feet.   Objective: AAO 3, NAD DP/PT pulses palpable, CRT less than 3 seconds Protective sensation intact with Simms Weinstein monofilament, vibratory sensation decreased Nails hypertrophic, dystrophic, elongated, brittle, discolored 10. There is tenderness overlying the nails 1-5 bilaterally. There is no surrounding erythema or drainage along the nail sites. Small abrasion type lesion to the left 2nd and 3rd toes. He does not recall any injury or trauma. No swelling or redness.  No open lesions or pre-ulcerative lesions are identified. No other areas of tenderness bilateral lower extremities. No overlying edema, erythema, increased warmth. Hammertoe contractures.  No pain with calf compression, swelling, warmth, erythema.  Assessment: Patient presents with symptomatic onychomycosis; toe abrasion left 2nd/3rd toes.   Plan: -Treatment options including alternatives, risks, complications were discussed -Nails sharply debrided 10 without complication/bleeding. -Monitor lesions to the left toes. Neosporin and bandage. Declined x-ray. Follow up if not healed in 2 weeks or sooner if any issues.  -Discussed daily foot inspection. If there are any changes, to call the office immediately.  -Follow-up in 3 months or sooner if any problems are to arise. In the meantime, encouraged to call the office with any questions, concerns, changes symptoms.  Celesta Gentile, DPM

## 2016-05-26 ENCOUNTER — Ambulatory Visit (INDEPENDENT_AMBULATORY_CARE_PROVIDER_SITE_OTHER): Payer: Medicare Other | Admitting: Podiatry

## 2016-05-26 VITALS — BP 107/52 | HR 64 | Resp 14

## 2016-05-26 DIAGNOSIS — E1149 Type 2 diabetes mellitus with other diabetic neurological complication: Secondary | ICD-10-CM

## 2016-05-26 DIAGNOSIS — M79676 Pain in unspecified toe(s): Secondary | ICD-10-CM

## 2016-05-26 DIAGNOSIS — B351 Tinea unguium: Secondary | ICD-10-CM | POA: Diagnosis not present

## 2016-05-26 DIAGNOSIS — M2042 Other hammer toe(s) (acquired), left foot: Secondary | ICD-10-CM

## 2016-05-26 DIAGNOSIS — M2041 Other hammer toe(s) (acquired), right foot: Secondary | ICD-10-CM

## 2016-05-26 NOTE — Progress Notes (Addendum)
Patient ID: [name], male   DOB: 1940-10-01, 75 y.o.   MRN: [id number]  Subjective: 75 y.o. returns the office today for painful, elongated, painful toenails that he cannot trim himself. No surrounding redness or drainage. No open sores. He does have numbness and tingling to his feet which have been ongoing for some time. His his been unchanged. No other complaints at this time and no acute changes. Denies any recent injury or trauma to his feet.   Objective: AAO 3, NAD DP/PT pulses palpable, CRT less than 3 seconds Nails hypertrophic, dystrophic, elongated, brittle, discolored 10. There is tenderness overlying the nails 1-5 bilaterally. There is no surrounding erythema or drainage along the nail sites. No open lesions or pre-ulcerative lesions are identified. No other areas of tenderness bilateral lower extremities. No overlying edema, erythema, increased warmth. Hammertoe contractures. Hammertoes present. No pain with calf compression, swelling, warmth, erythema.  Assessment: Patient presents with symptomatic onychomycosis  Plan: -Treatment options including alternatives, risks, complications were discussed -Nails sharply debrided 10 without complication/bleeding.  -Discussed daily foot inspection. If there are any changes, to call the office immediately.  -Patient would like diabetic shoes- paperwork was completed today for pre-certification.  -Follow-up in 3 months or sooner if any problems are to arise. In the meantime, encouraged to call the office with any questions, concerns, changes symptoms.  Celesta Gentile, DPM

## 2016-06-05 DIAGNOSIS — N390 Urinary tract infection, site not specified: Secondary | ICD-10-CM

## 2016-06-05 DIAGNOSIS — A419 Sepsis, unspecified organism: Secondary | ICD-10-CM | POA: Insufficient documentation

## 2016-06-06 DIAGNOSIS — D696 Thrombocytopenia, unspecified: Secondary | ICD-10-CM | POA: Insufficient documentation

## 2016-06-06 DIAGNOSIS — R197 Diarrhea, unspecified: Secondary | ICD-10-CM | POA: Insufficient documentation

## 2016-06-07 DIAGNOSIS — E876 Hypokalemia: Secondary | ICD-10-CM | POA: Insufficient documentation

## 2016-06-07 DIAGNOSIS — R7881 Bacteremia: Secondary | ICD-10-CM | POA: Insufficient documentation

## 2016-06-16 ENCOUNTER — Other Ambulatory Visit
Admission: RE | Admit: 2016-06-16 | Discharge: 2016-06-16 | Disposition: A | Discharge status: Home / Self Care | Payer: Medicare Other | Source: Ambulatory Visit | Attending: Internal Medicine | Admitting: Internal Medicine

## 2016-06-16 ENCOUNTER — Telehealth: Payer: Self-pay | Admitting: Podiatry

## 2016-06-16 DIAGNOSIS — I10 Essential (primary) hypertension: Secondary | ICD-10-CM | POA: Insufficient documentation

## 2016-06-16 DIAGNOSIS — R197 Diarrhea, unspecified: Secondary | ICD-10-CM | POA: Insufficient documentation

## 2016-06-16 LAB — BASIC METABOLIC PANEL
Anion gap: 11 (ref 5–15)
BUN: 32 mg/dL — ABNORMAL HIGH (ref 6–20)
CO2: 26 mmol/L (ref 22–32)
Calcium: 9.7 mg/dL (ref 8.9–10.3)
Chloride: 98 mmol/L — ABNORMAL LOW (ref 101–111)
Creatinine, Ser: 1.13 mg/dL (ref 0.61–1.24)
GFR calc Af Amer: >60 mL/min (ref >60)
GFR calc non Af Amer: >60 mL/min (ref >60)
Glucose, Bld: 163 mg/dL — ABNORMAL HIGH (ref 65–99)
Potassium: 4.1 mmol/L (ref 3.5–5.1)
Sodium: 135 mmol/L (ref 135–145)

## 2016-06-16 LAB — CBC
HCT: 38.4 % — ABNORMAL LOW (ref 40.0–52.0)
Hemoglobin: 13.6 g/dL (ref 13.0–18.0)
MCH: 32.8 pg (ref 26.0–34.0)
MCHC: 35.3 g/dL (ref 32.0–36.0)
MCV: 93 fL (ref 80.0–100.0)
Platelets: 282 10*3/uL (ref 150–440)
RBC: 4.13 MIL/uL — ABNORMAL LOW (ref 4.40–5.90)
RDW: 13.4 % (ref 11.5–14.5)
WBC: 6.3 10*3/uL (ref 3.8–10.6)

## 2016-06-16 LAB — MAGNESIUM: Magnesium: 1.7 mg/dL (ref 1.7–2.4)

## 2016-06-16 NOTE — Telephone Encounter (Signed)
Pt calling to check status of diabetic shoes.He said he was to be scheduled to be measured and has not heard anything and it has been a while.Can you please call pt

## 2016-06-16 NOTE — Telephone Encounter (Signed)
Lattie Haw- can you please check on this for him.

## 2016-06-22 ENCOUNTER — Telehealth: Payer: Self-pay | Admitting: *Deleted

## 2016-06-22 NOTE — Telephone Encounter (Signed)
Called patient about the diabetic paper work and stated that it was sent yesterday and that the fax number was wrong and if any concerns or questions just call the office and sorry for any inconveniences we may have caused. Lattie Haw

## 2016-06-23 ENCOUNTER — Telehealth: Payer: Self-pay | Admitting: Podiatry

## 2016-06-23 NOTE — Telephone Encounter (Signed)
Pt called again upset about his diabetic shoe paperwork. It needs to be sent to Dr Rosario Jacks in [place]. Pt states there office has already taken care of this a month ago.

## 2016-06-23 NOTE — Telephone Encounter (Signed)
Thank you Dawn for doing that for me. Lattie Haw

## 2016-06-23 NOTE — Telephone Encounter (Signed)
I faxed paper to Dr Guerry Bruin office and left a message for pt that I had done this and got a confirmation that fax went.

## 2016-06-27 ENCOUNTER — Encounter: Payer: Self-pay | Admitting: Urology

## 2016-06-27 ENCOUNTER — Ambulatory Visit (INDEPENDENT_AMBULATORY_CARE_PROVIDER_SITE_OTHER): Payer: Medicare Other | Admitting: Urology

## 2016-06-27 VITALS — BP 89/50 | HR 64 | Ht 68.0 in | Wt 226.5 lb

## 2016-06-27 DIAGNOSIS — N402 Nodular prostate without lower urinary tract symptoms: Secondary | ICD-10-CM | POA: Diagnosis not present

## 2016-06-27 DIAGNOSIS — N138 Other obstructive and reflux uropathy: Secondary | ICD-10-CM | POA: Diagnosis not present

## 2016-06-27 DIAGNOSIS — A4151 Sepsis due to Escherichia coli [E. coli]: Secondary | ICD-10-CM | POA: Diagnosis not present

## 2016-06-27 DIAGNOSIS — N401 Enlarged prostate with lower urinary tract symptoms: Secondary | ICD-10-CM

## 2016-06-27 DIAGNOSIS — R972 Elevated prostate specific antigen [PSA]: Secondary | ICD-10-CM | POA: Diagnosis not present

## 2016-06-27 LAB — URINALYSIS, COMPLETE
BILIRUBIN UA: NEGATIVE
GLUCOSE, UA: NEGATIVE
Ketones, UA: NEGATIVE
Leukocytes, UA: NEGATIVE
NITRITE UA: NEGATIVE
PH UA: 7 (ref 5.0–7.5)
PROTEIN UA: NEGATIVE
RBC UA: NEGATIVE
Specific Gravity, UA: 1.015 (ref 1.005–1.030)
UUROB: 0.2 mg/dL (ref 0.2–1.0)

## 2016-06-27 LAB — MICROSCOPIC EXAMINATION
Bacteria, UA: NONE SEEN
RBC, UA: NONE SEEN /hpf (ref 0–?)
WBC UA: NONE SEEN /HPF (ref 0–?)

## 2016-06-27 LAB — BLADDER SCAN AMB NON-IMAGING: SCAN RESULT: 43

## 2016-06-27 NOTE — Progress Notes (Signed)
06/27/2016 11:06 AM   [name] Ferguson 04-11-1941 [id number]  Referring provider: Cletis Athens, MD [address] [place], [place] [address]  Chief Complaint  Patient presents with  . Hospitalization Follow-up    Patient at [facility] ([facility]) and wanted to follow up    HPI: Patient is 75 year old Caucasian male who presents today for follow up after a recent admission for sepsis due to E. Coli.    Patient was last seen in 09/2015 for BPH with LU TS.  PVR was minimal.  He was instructed to continue tamsulosin and to follow up in one year.    Patient presented on 06/04/2016 to [facility] ED with dysuria, U/A c/f infection in setting of recent diarrhea with possible penile contamination. He also described rigors/shakes. No prior UTIs. Started on ceftriaxone on admit. UCx and E coli with similar resistance pattern, fairly pan-S. Developed recurrent fevers after period of defervescence on 11/29 so broadened briefly to zosyn. Fever work-up largely unremarkable. Repeat blood cultures, urine culture negative. CXR negative for acute pulm process. CT a/p performed to evaluate for abscess and was negative but did show possible BPH nodule vs prostatitis. DRE exam performed after this finding and was negative for tenderness. PSA only mildly above range. Given lack of tenderness on prostate exam, did not feel clinical picture as c/w prostatitis and continued treatment for UTI/pyelo and bacteremia. Fevers quickly resolved and so patient transitioned to ciprofloxacin based on sensitivities. Patient did well and had no fevers for 24-48h prior to discharge. Rx given to complete 14-day course. PCP and urology f/u.  Since being discharged from the hospital, he has been feeling well.  His UA today is unremarkable.  His PVR is 43 mL.  He is still taking his tamsulosin.     His IPSS score today is 7, which is mild lower urinary tract symptomatology. He is pleased with his quality life due to his urinary symptoms. His PVR today is 43  mL.  His previous IPS'S score was 9/1.  His previous PVR was 20 mL.  He denies any dysuria, hematuria or suprapubic pain.   He denies any recent fevers, chills, nausea or vomiting.  He does not have a family history of PCa.      IPSS    Row Name 06/27/16 1000         International Prostate Symptom Score   How often have you had the sensation of not emptying your bladder? Not at All     How often have you had to urinate less than every two hours? Not at All     How often have you found you stopped and started again several times when you urinated? Less than 1 in 5 times     How often have you found it difficult to postpone urination? Less than half the time     How often have you had a weak urinary stream? Less than half the time     How often have you had to strain to start urination? Not at All     How many times did you typically get up at night to urinate? 2 Times     Total IPSS Score 7       Quality of Life due to urinary symptoms   If you were to spend the rest of your life with your urinary condition just the way it is now how would you feel about that? Pleased        Score:  1-7 Mild 8-19  Moderate 20-35 Severe    PMH: Past Medical History:  Diagnosis Date  . Angina pectoris ([place])   . 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 BPH (benign prostatic hypertrophy) with urinary obstruction   . CHF (congestive heart failure) ([place])   . Diabetes mellitus without complication ([place])   . HLD (hyperlipidemia)   . Hypertension   . Obesity   . Penile adhesions   . Sleep apnea   . SOB (shortness of breath)   . Stroke [facility])     Surgical History: Past Surgical History:  Procedure Laterality Date  . BACK SURGERY    . CARDIAC CATHETERIZATION    . COLONOSCOPY    . COLONOSCOPY WITH PROPOFOL N/A 09/22/2015   Procedure: COLONOSCOPY WITH PROPOFOL;  Surgeon: Lucilla Lame, MD;  Location: [facility] ENDOSCOPY;  Service: Endoscopy;  Laterality: N/A;  . CORONARY ARTERY BYPASS GRAFT   1996  . HEMORRHOID SURGERY  2007  . HEMORRHOID SURGERY  08-12-15   excision internal hemorrhoid Dr Jamal Collin  . JOINT REPLACEMENT Left 2004   Knee    Home Medications:  Allergies as of 06/27/2016   No Known Allergies     Medication List       Accurate as of 06/27/16 11:06 AM. Always use your most recent med list.          aspirin 81 MG tablet Take 81 mg by mouth daily.   carvedilol 12.5 MG tablet Commonly known as:  COREG   CINNAMON PO Take by mouth.   clopidogrel 75 MG tablet Commonly known as:  PLAVIX   dorzolamide 2 % ophthalmic solution Commonly known as:  TRUSOPT Administer 2 drops to both eyes Two (2) times a day.   fenofibrate 145 MG tablet Commonly known as:  TRICOR   Fish Oil 1000 MG Caps Take 2 capsules by mouth 2 (two) times daily.   FISH OIL PO Take by mouth.   furosemide 40 MG tablet Commonly known as:  LASIX Take 40 mg by mouth. Patient states 60 in the am and 40 in the pm   glucosamine-chondroitin 500-400 MG tablet Take 1 tablet by mouth daily.   HUMALOG KWIKPEN 100 UNIT/ML KiwkPen Generic drug:  insulin lispro   insulin aspart 100 UNIT/ML injection Commonly known as:  novoLOG Inject into the skin 3 (three) times daily with meals.   isosorbide mononitrate 30 MG 24 hr tablet Commonly known as:  IMDUR Take 30 mg by mouth daily.   Lecithin 1200 MG Caps Take by mouth daily.   LEVEMIR FLEXTOUCH 100 UNIT/ML Pen Generic drug:  Insulin Detemir Inject into the skin.   lisinopril 40 MG tablet Commonly known as:  PRINIVIL,ZESTRIL Take 20 mg by mouth daily.   magnesium oxide 400 MG tablet Commonly known as:  MAG-OX Take by mouth.   metFORMIN 1000 MG tablet Commonly known as:  GLUCOPHAGE 1,000 mg 2 (two) times daily with a meal.   MULTI-VITAMINS Tabs Take by mouth.   nitroGLYCERIN 0.4 MG SL tablet Commonly known as:  NITROSTAT Place under the tongue.   pravastatin 40 MG tablet Commonly known as:  PRAVACHOL Take 40 mg by mouth  daily.   spironolactone 25 MG tablet Commonly known as:  ALDACTONE Take 25 mg by mouth daily.   tamsulosin 0.4 MG Caps capsule Commonly known as:  FLOMAX Take 1 capsule (0.4 mg total) by mouth daily.   UNIFINE PENTIPS 31G X 8 MM Misc Generic drug:  Insulin Pen Needle as directed   vitamin B-12 500  MCG tablet Commonly known as:  CYANOCOBALAMIN Take 500 mcg by mouth daily.   vitamin C 500 MG tablet Commonly known as:  ASCORBIC ACID Take 500 mg by mouth daily.   XALATAN 0.005 % ophthalmic solution Generic drug:  latanoprost Apply to eye.       Allergies: No Known Allergies  Family History: Family History  Problem Relation Age of Onset  . Diabetes Mother   . Alzheimer's disease Mother   . Cancer Sister   . Heart disease      Family members in general  . Diabetes Brother   . Prostate cancer Neg Hx   . Bladder Cancer Neg Hx   . Kidney disease Neg Hx     Social History:  reports that he quit smoking about 34 years ago. He has never used smokeless tobacco. He reports that he drinks alcohol. He reports that he does not use drugs.  ROS: UROLOGY Frequent Urination?: Yes Hard to postpone urination?: No Burning/pain with urination?: No Get up at night to urinate?: No Leakage of urine?: Yes Urine stream starts and stops?: No Trouble starting stream?: No Do you have to strain to urinate?: No Blood in urine?: No Urinary tract infection?: No Sexually transmitted disease?: No Injury to kidneys or bladder?: No Painful intercourse?: No Weak stream?: No Erection problems?: No Penile pain?: No  Gastrointestinal Nausea?: No Vomiting?: No Indigestion/heartburn?: No Diarrhea?: No Constipation?: No  Constitutional Fever: No Night sweats?: No Weight loss?: No Fatigue?: No  Skin Skin rash/lesions?: No Itching?: No  Eyes Blurred vision?: No Double vision?: No  Ears/Nose/Throat Sore throat?: No Sinus problems?: No  Hematologic/Lymphatic Swollen glands?:  No Easy bruising?: No  Cardiovascular Leg swelling?: No Chest pain?: No  Respiratory Cough?: No Shortness of breath?: Yes  Endocrine Excessive thirst?: No  Musculoskeletal Back pain?: No Joint pain?: No  Neurological Headaches?: No Dizziness?: No  Psychologic Depression?: No Anxiety?: No  Physical Exam: BP (!) 89/50   Pulse 64   Ht 5\' 8"  (1.727 m)   Wt 226 lb 8 oz (102.7 kg)   BMI 34.44 kg/m   Constitutional: Well nourished. Alert and oriented, No acute distress. HEENT: [place] AT, moist mucus membranes. Trachea midline, no masses. Cardiovascular: No clubbing, cyanosis, or edema. Respiratory: Normal respiratory effort, no increased work of breathing. GI: Abdomen is soft, non tender, non distended, no abdominal masses. Liver and spleen not palpable.  No hernias appreciated.  Stool sample for occult testing is not indicated.   GU: No CVA tenderness.  No bladder fullness or masses.  Patient with uncircumcised phallus. Foreskin easily retracted  Urethral meatus is patent.  No penile discharge. No penile lesions or rashes. Scrotum without lesions, cysts, rashes and/or edema.  Testicles are located scrotally bilaterally. No masses are appreciated in the testicles. Left and right epididymis are normal. Rectal: Patient with  normal sphincter tone. Anus and perineum without scarring or rashes. No rectal masses are appreciated. Prostate is approximately 40 grams, 1 cm x 1 cm nodules are appreciated. Seminal vesicles are normal. Skin: No rashes, bruises or suspicious lesions. Lymph: No cervical or inguinal adenopathy. Neurologic: Grossly intact, no focal deficits, moving all 4 extremities. Psychiatric: Normal mood and affect.  Laboratory Data: PSA History  2.0 ng/mL on 09/07/2015  7.43 ng/mL on 06/10/2016   Pertinent Imaging: CT abdomen and pelvis with IV contrast  Comparison:None.  Indication:GNR bacteremia, persistent fevers despite UTI tx, Fever and chills,  unspecified, R50.9 Fever, unspecified, R78.81 Bacteremia.  Technique:CT imaging was performed of  the abdomen and pelvis following the uncomplicated administration of intravenous contrast (Isovue-300, 150 mL at 3 mL/sec).Iodinated contrast was used due to the indications for the examination, to improve disease detection and further define anatomy. The most recent serum creatinine is 0.9 mg/dL. Coronal and sagittal reformatted images were generated and reviewed.  Findings:  Cholelithiasis without evidence of acute cholecystitis. Liver, right adrenal gland, spleen, pancreas are within normal limits. Right renal cysts. No hydronephrosis. Symmetric renal parenchymal enhancement without CT findings to suggest pyelonephritis. 2.9 x 2.5 cm heterogeneous left adrenal lesion; on thin section imaging, small foci of macroscopic fat are likely present by visual inspection (too small for definite ROI placement).  No bowel obstruction. Appendix is within normal limits. No ascites, fluid collection, or free air.   No suspicious pelvic mass. Enlarged prostate with low attenuating structure (likely within the right right central gland) measuring 1.1 x 1 cm. Mild bladder wall thickening without significant surrounding fat attending.  Patent vasculature with normal caliber abdominal aorta with moderate diffuse atherosclerotic plaque. No abnormally enlarged lymph nodes.  No destructive bone lesion. Degenerative changes with associated florid mineralization along the pubic symphysis.   Subsegmental atelectasis at the lung bases. Sub-4 mm left lower lobe lung nodule. Right lower lobe calcified granulomas. Cardiomegaly.  Impression:  1. Enlarged prostate with small internal low attenuating structure that is not well visualized by CT; differential considerations include, but are not limited to a BPH nodule or sequela of prostatitis with possible tiny nondrainable fluid collection. Additionally,  there is mild bladder wall thickening that is likely on the basis of some degree of mild chronic bladder outlet obstruction in the setting of enlarged prostate (with cystitis deemed less likely given lack of significant surrounding inflammatory change). Correlation with urinalysis and PSA is advised.  2. Cholelithiasis without evidence of acute cholecystitis.  3. 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  Electronically Reviewed [id number] Nyoka Lint, MD Electronically Reviewed on:06/08/2016 3:39 PM  I have reviewed the images and concur with the above findings.  Electronically Signed [id number] Sabra Heck, MD Electronically Signed on:06/09/2016 2:42 PM  Status Results Details   Results for HINTON, COTA (MRN [id number]) as of 06/27/2016 10:55  Ref. Range 06/27/2016 10:46  Scan Result Unknown 43     Assessment & Plan:    1. Sepsis  - resolved at this time  - UA is unremarkable  - PVR is minimal  2. BPH (benign prostatic hyperplasia) with LUTS:      - IPSS score is 7/1.  He will continue the tamsulosin 0.4 mg daily.     - He will RTC pending PSA results  - BLADDER SCAN AMB NON-IMAGING  - PSA  3. Prostate nodule  - seen on CT and palpable on exam  - PSA  4. Elevated PSA  - uncertain at this time whether elevation is due to infection or cancer  - will need further discussion once PSA results available concerning next step as patient has Class III heart failure  Return for pending PSA.  These notes generated with voice recognition software. I apologize for typographical errors.  Zara Council, [facility] [address] [place], [place] [address] [phone]

## 2016-06-28 ENCOUNTER — Telehealth: Payer: Self-pay

## 2016-06-28 DIAGNOSIS — R972 Elevated prostate specific antigen [PSA]: Secondary | ICD-10-CM

## 2016-06-28 LAB — PSA: Prostate Specific Ag, Serum: 2.9 ng/mL (ref 0.0–4.0)

## 2016-06-28 NOTE — Telephone Encounter (Signed)
-----   Message from Nori Riis, PA-C sent at 06/28/2016 10:27 AM EST ----- Please notify the patient that his PSA has returned to normal levels.  I suggest repeating the PSA and prostate exam in 6 months.

## 2016-06-28 NOTE — Telephone Encounter (Signed)
Spoke with pt in reference to PSA results. Pt voiced understanding. Pt was transferred to the front to make f/u appt and labs. Orders placed.

## 2016-07-13 ENCOUNTER — Ambulatory Visit (INDEPENDENT_AMBULATORY_CARE_PROVIDER_SITE_OTHER): Payer: Self-pay | Admitting: Podiatry

## 2016-07-13 DIAGNOSIS — E1149 Type 2 diabetes mellitus with other diabetic neurological complication: Secondary | ICD-10-CM

## 2016-07-13 DIAGNOSIS — M2042 Other hammer toe(s) (acquired), left foot: Secondary | ICD-10-CM

## 2016-07-13 DIAGNOSIS — M2041 Other hammer toe(s) (acquired), right foot: Secondary | ICD-10-CM

## 2016-07-19 NOTE — Progress Notes (Signed)
Measured for diabetic shoes and insoles today. Will notify pt once they arrive. 

## 2016-09-08 ENCOUNTER — Ambulatory Visit: Payer: PRIVATE HEALTH INSURANCE | Admitting: Urology

## 2016-09-14 ENCOUNTER — Ambulatory Visit (INDEPENDENT_AMBULATORY_CARE_PROVIDER_SITE_OTHER): Payer: Medicare Other | Admitting: Podiatry

## 2016-09-14 DIAGNOSIS — M2042 Other hammer toe(s) (acquired), left foot: Secondary | ICD-10-CM

## 2016-09-14 DIAGNOSIS — E1149 Type 2 diabetes mellitus with other diabetic neurological complication: Secondary | ICD-10-CM | POA: Diagnosis not present

## 2016-09-14 DIAGNOSIS — L84 Corns and callosities: Secondary | ICD-10-CM | POA: Diagnosis not present

## 2016-09-14 DIAGNOSIS — M2041 Other hammer toe(s) (acquired), right foot: Secondary | ICD-10-CM

## 2016-09-15 ENCOUNTER — Other Ambulatory Visit: Payer: Self-pay | Admitting: Urology

## 2016-09-15 DIAGNOSIS — N401 Enlarged prostate with lower urinary tract symptoms: Secondary | ICD-10-CM

## 2016-09-26 ENCOUNTER — Ambulatory Visit (INDEPENDENT_AMBULATORY_CARE_PROVIDER_SITE_OTHER): Payer: Medicare Other | Admitting: Podiatry

## 2016-09-26 ENCOUNTER — Encounter: Payer: Self-pay | Admitting: Podiatry

## 2016-09-26 DIAGNOSIS — B351 Tinea unguium: Secondary | ICD-10-CM | POA: Diagnosis not present

## 2016-09-26 DIAGNOSIS — M79676 Pain in unspecified toe(s): Secondary | ICD-10-CM

## 2016-09-26 DIAGNOSIS — M2010 Hallux valgus (acquired), unspecified foot: Secondary | ICD-10-CM

## 2016-09-26 DIAGNOSIS — E1149 Type 2 diabetes mellitus with other diabetic neurological complication: Secondary | ICD-10-CM

## 2016-09-26 NOTE — Progress Notes (Signed)
Complaint:  Visit Type: Patient returns to my office for continued preventative foot care services. Complaint: Patient states" my nails have grown long and thick and become painful to walk and wear shoes" Patient has been diagnosed with DM with no foot complications. The patient presents for preventative foot care services. No changes to ROS.  He says he has picked up his new diabetic shoes and says they are doing well.  Podiatric Exam: Vascular: dorsalis pedis and posterior tibial pulses are palpable bilateral. Capillary return is immediate. Temperature gradient is WNL. Skin turgor WNL  Sensorium: Diminished  Semmes Weinstein monofilament test. Normal tactile sensation bilaterally. Nail Exam: Pt has thick disfigured discolored nails with subungual debris noted bilateral entire nail hallux through fifth toenails Ulcer Exam: There is no evidence of ulcer or pre-ulcerative changes or infection. Orthopedic Exam: Muscle tone and strength are WNL. No limitations in general ROM. No crepitus or effusions noted. Foot type and digits show no abnormalities. HAV  B/L with hammer toes. Skin: No Porokeratosis. No infection or ulcers  Diagnosis:  Onychomycosis, , Pain in right toe, pain in left toes  Treatment & Plan Procedures and Treatment: Consent by patient was obtained for treatment procedures. The patient understood the discussion of treatment and procedures well. All questions were answered thoroughly reviewed. Debridement of mycotic and hypertrophic toenails, 1 through 5 bilateral and clearing of subungual debris. No ulceration, no infection noted.  Return Visit-Office Procedure: Patient instructed to return to the office for a follow up visit 3 months for continued evaluation and treatment.    Jermeka Schlotterbeck DPM 

## 2016-09-28 NOTE — Progress Notes (Signed)
Dispensed diabetic shoes and 3 pairs of insoles. Instructions were reviewed and a copy was given to the patient. Patient will reappointment for regularly scheduled diabetic foot care visits or if they experience any trouble with the diabetic shoes.  

## 2016-10-24 DIAGNOSIS — R0789 Other chest pain: Secondary | ICD-10-CM | POA: Insufficient documentation

## 2016-10-24 DIAGNOSIS — R0602 Shortness of breath: Secondary | ICD-10-CM | POA: Insufficient documentation

## 2016-10-25 DIAGNOSIS — Z9989 Dependence on other enabling machines and devices: Secondary | ICD-10-CM

## 2016-10-25 DIAGNOSIS — G4733 Obstructive sleep apnea (adult) (pediatric): Secondary | ICD-10-CM | POA: Insufficient documentation

## 2016-10-25 DIAGNOSIS — I5033 Acute on chronic diastolic (congestive) heart failure: Secondary | ICD-10-CM | POA: Insufficient documentation

## 2016-12-20 ENCOUNTER — Other Ambulatory Visit: Payer: Medicare Other

## 2016-12-20 ENCOUNTER — Encounter: Payer: Self-pay | Admitting: Urology

## 2016-12-20 DIAGNOSIS — R972 Elevated prostate specific antigen [PSA]: Secondary | ICD-10-CM

## 2016-12-21 LAB — PSA: PROSTATE SPECIFIC AG, SERUM: 1.9 ng/mL (ref 0.0–4.0)

## 2016-12-26 ENCOUNTER — Encounter: Payer: Self-pay | Admitting: Podiatry

## 2016-12-26 ENCOUNTER — Ambulatory Visit (INDEPENDENT_AMBULATORY_CARE_PROVIDER_SITE_OTHER): Payer: Medicare Other | Admitting: Podiatry

## 2016-12-26 DIAGNOSIS — M79676 Pain in unspecified toe(s): Secondary | ICD-10-CM

## 2016-12-26 DIAGNOSIS — E1149 Type 2 diabetes mellitus with other diabetic neurological complication: Secondary | ICD-10-CM

## 2016-12-26 DIAGNOSIS — B351 Tinea unguium: Secondary | ICD-10-CM | POA: Diagnosis not present

## 2016-12-26 DIAGNOSIS — M2010 Hallux valgus (acquired), unspecified foot: Secondary | ICD-10-CM

## 2016-12-26 NOTE — Progress Notes (Signed)
Complaint:  Visit Type: Patient returns to my office for continued preventative foot care services. Complaint: Patient states" my nails have grown long and thick and become painful to walk and wear shoes" Patient has been diagnosed with DM with no foot complications. The patient presents for preventative foot care services. No changes to ROS.  He says he has picked up his new diabetic shoes and says they are doing well.  Podiatric Exam: Vascular: dorsalis pedis and posterior tibial pulses are palpable bilateral. Capillary return is immediate. Temperature gradient is WNL. Skin turgor WNL  Sensorium: Diminished  Semmes Weinstein monofilament test. Normal tactile sensation bilaterally. Nail Exam: Pt has thick disfigured discolored nails with subungual debris noted bilateral entire nail hallux through fifth toenails Ulcer Exam: There is no evidence of ulcer or pre-ulcerative changes or infection. Orthopedic Exam: Muscle tone and strength are WNL. No limitations in general ROM. No crepitus or effusions noted. Foot type and digits show no abnormalities. HAV  B/L with hammer toes. Skin: No Porokeratosis. No infection or ulcers  Diagnosis:  Onychomycosis, , Pain in right toe, pain in left toes  Treatment & Plan Procedures and Treatment: Consent by patient was obtained for treatment procedures. The patient understood the discussion of treatment and procedures well. All questions were answered thoroughly reviewed. Debridement of mycotic and hypertrophic toenails, 1 through 5 bilateral and clearing of subungual debris. No ulceration, no infection noted.  Return Visit-Office Procedure: Patient instructed to return to the office for a follow up visit 3 months for continued evaluation and treatment.    Gussie Murton DPM 

## 2016-12-26 NOTE — Progress Notes (Signed)
12/27/2016 11:24 AM   [name] Ferguson 08-11-1940 [id number]  Referring provider: Cletis Athens, MD [address] [place], [place] [address]  Chief Complaint  Patient presents with  . Elevated PSA    1 year follow up  . Benign Prostatic Hypertrophy    HPI: 76 yo WM with BPH with LU TS, prostate nodules and a history of sepsis presents for a one year follow up.  BPH with LU TS His IPSS score today is 5, which is mild lower urinary tract symptomatology.  He is pleased with his quality life due to his urinary symptoms.  His previous I PSS score was 7/1.    His previous PVR was 43 mL.  His main complaints today are nocturia x 1  He denies any dysuria, hematuria or suprapubic pain.   He denies any recent fevers, chills, nausea or vomiting.  He does not have a family history of PCa.      IPSS    Row Name 12/27/16 1100         International Prostate Symptom Score   How often have you had the sensation of not emptying your bladder? Not at All     How often have you had to urinate less than every two hours? Less than 1 in 5 times     How often have you found you stopped and started again several times when you urinated? Less than 1 in 5 times     How often have you found it difficult to postpone urination? Less than 1 in 5 times     How often have you had a weak urinary stream? Less than 1 in 5 times     How often have you had to strain to start urination? Not at All     How many times did you typically get up at night to urinate? 1 Time     Total IPSS Score 5       Quality of Life due to urinary symptoms   If you were to spend the rest of your life with your urinary condition just the way it is now how would you feel about that? Pleased        Score:  1-7 Mild 8-19 Moderate 20-35 Severe   History of sepsis with E.coli Patient presented on 06/04/2016 to [facility] ED with dysuria, U/A c/f infection in setting of recent diarrhea with possible penile contamination. He also described  rigors/shakes. No prior UTIs. Started on ceftriaxone on admit. UCx and E coli with similar resistance pattern, fairly pan-S. Developed recurrent fevers after period of defervescence on 11/29 so broadened briefly to zosyn. Fever work-up largely unremarkable. Repeat blood cultures, urine culture negative. CXR negative for acute pulm process. CT a/p performed to evaluate for abscess and was negative but did show possible BPH nodule vs prostatitis. DRE exam performed after this finding and was negative for tenderness. PSA only mildly above range. Given lack of tenderness on prostate exam, did not feel clinical picture as c/w prostatitis and continued treatment for UTI/pyelo and bacteremia. Fevers quickly resolved and so patient transitioned to ciprofloxacin based on sensitivities. Patient did well and had no fevers for 24-48h prior to discharge. Rx given to complete 14-day course. PCP and urology f/u.     PMH: Past Medical History:  Diagnosis Date  . Angina pectoris ([place])   . Atherosclerosis with limb claudication ([place])   . BPH (benign prostatic hypertrophy) with urinary obstruction   . CHF (congestive heart failure) (  [place])   . Diabetes mellitus without complication ([place])   . HLD (hyperlipidemia)   . Hypertension   . Obesity   . Penile adhesions   . Sleep apnea   . SOB (shortness of breath)   . Stroke [facility])     Surgical History: Past Surgical History:  Procedure Laterality Date  . BACK SURGERY    . CARDIAC CATHETERIZATION    . COLONOSCOPY    . COLONOSCOPY WITH PROPOFOL N/A 09/22/2015   Procedure: COLONOSCOPY WITH PROPOFOL;  Surgeon: Lucilla Lame, MD;  Location: [facility] ENDOSCOPY;  Service: Endoscopy;  Laterality: N/A;  . CORONARY ARTERY BYPASS GRAFT  1996  . HEMORRHOID SURGERY  2007  . HEMORRHOID SURGERY  08-12-15   excision internal hemorrhoid Dr Jamal Collin  . JOINT REPLACEMENT Left 2004   Knee    Home Medications:  Allergies as of 12/27/2016      Reactions   Statins Other (See Comments)    Suspected cause of muscle pain      Medication List       Accurate as of 12/27/16 11:24 AM. Always use your most recent med list.          amoxicillin 500 MG capsule Commonly known as:  AMOXIL before dental procedure   aspirin 81 MG tablet Take 81 mg by mouth daily.   carvedilol 12.5 MG tablet Commonly known as:  COREG   CINNAMON PO Take by mouth.   clopidogrel 75 MG tablet Commonly known as:  PLAVIX   dorzolamide 2 % ophthalmic solution Commonly known as:  TRUSOPT Administer 2 drops to both eyes Two (2) times a day.   fenofibrate 145 MG tablet Commonly known as:  TRICOR   Fish Oil 1000 MG Caps Take 2 capsules by mouth 2 (two) times daily.   FISH OIL PO Take by mouth.   fluticasone 50 MCG/ACT nasal spray Commonly known as:  FLONASE U 1 SPR NASALLY BID   furosemide 40 MG tablet Commonly known as:  LASIX Take 40 mg by mouth. Patient states 60 in the am and 40 in the pm   glucosamine-chondroitin 500-400 MG tablet Take 1 tablet by mouth daily.   HUMALOG KWIKPEN 100 UNIT/ML KiwkPen Generic drug:  insulin lispro   insulin aspart 100 UNIT/ML injection Commonly known as:  novoLOG Inject into the skin 3 (three) times daily with meals.   isosorbide mononitrate 30 MG 24 hr tablet Commonly known as:  IMDUR Take 30 mg by mouth daily.   Lecithin 1200 MG Caps Take by mouth daily.   LEVEMIR FLEXTOUCH 100 UNIT/ML Pen Generic drug:  Insulin Detemir Inject 50 Units into the skin.   lisinopril 40 MG tablet Commonly known as:  PRINIVIL,ZESTRIL Take 20 mg by mouth daily.   magnesium oxide 400 MG tablet Commonly known as:  MAG-OX Take by mouth.   metFORMIN 1000 MG tablet Commonly known as:  GLUCOPHAGE 1,000 mg 2 (two) times daily with a meal.   metoprolol succinate 25 MG 24 hr tablet Commonly known as:  TOPROL-XL   MULTI-VITAMINS Tabs Take by mouth.   nitroGLYCERIN 0.4 MG SL tablet Commonly known as:  NITROSTAT Place under the tongue.   pravastatin  40 MG tablet Commonly known as:  PRAVACHOL Take 40 mg by mouth daily.   SIMBRINZA 1-0.2 % Susp Generic drug:  Brinzolamide-Brimonidine   spironolactone 25 MG tablet Commonly known as:  ALDACTONE Take 25 mg by mouth daily.   tamsulosin 0.4 MG Caps capsule Commonly known as:  FLOMAX TAKE 1  CAPSULE(0.4 MG) BY MOUTH DAILY   torsemide 20 MG tablet Commonly known as:  DEMADEX   triamcinolone cream 0.1 % Commonly known as:  KENALOG   UNIFINE PENTIPS 31G X 8 MM Misc Generic drug:  Insulin Pen Needle as directed   vitamin B-12 500 MCG tablet Commonly known as:  CYANOCOBALAMIN Take 500 mcg by mouth daily.   vitamin C 500 MG tablet Commonly known as:  ASCORBIC ACID Take 500 mg by mouth daily.   XALATAN 0.005 % ophthalmic solution Generic drug:  latanoprost Apply to eye.       Allergies:  Allergies  Allergen Reactions  . Statins Other (See Comments)    Suspected cause of muscle pain    Family History: Family History  Problem Relation Age of Onset  . Diabetes Mother   . Alzheimer's disease Mother   . Cancer Sister   . Heart disease Unknown        Family members in general  . Diabetes Brother   . Prostate cancer Neg Hx   . Bladder Cancer Neg Hx   . Kidney disease Neg Hx     Social History:  reports that he quit smoking about 35 years ago. He has never used smokeless tobacco. He reports that he drinks alcohol. He reports that he does not use drugs.  ROS: UROLOGY Frequent Urination?: No Hard to postpone urination?: No Burning/pain with urination?: No Get up at night to urinate?: No Leakage of urine?: No Urine stream starts and stops?: No Trouble starting stream?: No Do you have to strain to urinate?: No Blood in urine?: No Urinary tract infection?: No Sexually transmitted disease?: No Injury to kidneys or bladder?: No Painful intercourse?: No Weak stream?: No Erection problems?: No Penile pain?: No  Gastrointestinal Nausea?: No Vomiting?:  No Indigestion/heartburn?: No Diarrhea?: Yes Constipation?: No  Constitutional Fever: No Night sweats?: No Weight loss?: No Fatigue?: Yes  Skin Skin rash/lesions?: No Itching?: No  Eyes Blurred vision?: No Double vision?: No  Ears/Nose/Throat Sore throat?: No Sinus problems?: No  Hematologic/Lymphatic Swollen glands?: No Easy bruising?: Yes  Cardiovascular Leg swelling?: Yes Chest pain?: No  Respiratory Cough?: No Shortness of breath?: Yes  Endocrine Excessive thirst?: No  Musculoskeletal Back pain?: No Joint pain?: Yes  Neurological Headaches?: No Dizziness?: Yes  Psychologic Depression?: No Anxiety?: No  Physical Exam: BP 129/70   Pulse 75   Ht 5\' 8"  (1.727 m)   Wt 238 lb 14.4 oz (108.4 kg)   BMI 36.32 kg/m   Constitutional: Well nourished. Alert and oriented, No acute distress. HEENT: [place] AT, moist mucus membranes. Trachea midline, no masses. Cardiovascular: No clubbing, cyanosis, or edema. Respiratory: Normal respiratory effort, no increased work of breathing. GI: Abdomen is soft, non tender, non distended, no abdominal masses. Liver and spleen not palpable.  No hernias appreciated.  Stool sample for occult testing is not indicated.   GU: No CVA tenderness.  No bladder fullness or masses.  Patient with uncircumcised phallus. Foreskin easily retracted  Urethral meatus is patent.  No penile discharge. No penile lesions or rashes. Scrotum without lesions, cysts, rashes and/or edema.  Testicles are located scrotally bilaterally. No masses are appreciated in the testicles. Left and right epididymis are normal. Rectal: Patient with  normal sphincter tone. Anus and perineum without scarring or rashes. No rectal masses are appreciated. Prostate is approximately 40 grams, 1 cm x 1 cm nodules are appreciated. Seminal vesicles are normal. Skin: No rashes, bruises or suspicious lesions. Lymph: No cervical or  inguinal adenopathy. Neurologic: Grossly intact,  no focal deficits, moving all 4 extremities. Psychiatric: Normal mood and affect.  Laboratory Data: PSA History  2.0 ng/mL on 09/07/2015  7.43 ng/mL on 06/10/2016  1.9 ng/mL on 12/20/2016   Assessment & Plan:    1. BPH (benign prostatic hyperplasia) with LUTS:      - IPSS score is 5/1.  It is improving.  He will continue the tamsulosin 0.4 mg daily.     - He will RTC in one year for PSA, I PSS and exam    2. Prostate nodule  -  Not palpable on today's exam  - PSA returned to normal  3. History of elevated PSA  - uncertain at this time whether elevation is due to infection or cancer  - will need further discussion once PSA results available concerning next step as patient has Class III heart failure - patient and wife are still wanting to have yearly PSA's at this time  Return in about 1 year (around 12/27/2017) for IPSS, PSA and exam.  These notes generated with voice recognition software. I apologize for typographical errors.  Zara Council, [facility] [address], [place], [place] [address] [phone]

## 2016-12-27 ENCOUNTER — Encounter: Payer: Self-pay | Admitting: Urology

## 2016-12-27 ENCOUNTER — Ambulatory Visit (INDEPENDENT_AMBULATORY_CARE_PROVIDER_SITE_OTHER): Payer: Medicare Other | Admitting: Urology

## 2016-12-27 ENCOUNTER — Other Ambulatory Visit: Payer: Self-pay | Admitting: Urology

## 2016-12-27 VITALS — BP 129/70 | HR 75 | Ht 68.0 in | Wt 238.9 lb

## 2016-12-27 DIAGNOSIS — Z87898 Personal history of other specified conditions: Secondary | ICD-10-CM | POA: Diagnosis not present

## 2016-12-27 DIAGNOSIS — N401 Enlarged prostate with lower urinary tract symptoms: Secondary | ICD-10-CM | POA: Diagnosis not present

## 2016-12-27 DIAGNOSIS — N138 Other obstructive and reflux uropathy: Secondary | ICD-10-CM

## 2016-12-27 DIAGNOSIS — N402 Nodular prostate without lower urinary tract symptoms: Secondary | ICD-10-CM | POA: Diagnosis not present

## 2017-03-27 ENCOUNTER — Encounter: Payer: Self-pay | Admitting: Podiatry

## 2017-03-27 ENCOUNTER — Ambulatory Visit (INDEPENDENT_AMBULATORY_CARE_PROVIDER_SITE_OTHER): Payer: Medicare Other | Admitting: Podiatry

## 2017-03-27 DIAGNOSIS — B351 Tinea unguium: Secondary | ICD-10-CM

## 2017-03-27 DIAGNOSIS — M79675 Pain in left toe(s): Secondary | ICD-10-CM | POA: Diagnosis not present

## 2017-03-27 DIAGNOSIS — M79674 Pain in right toe(s): Secondary | ICD-10-CM | POA: Diagnosis not present

## 2017-03-27 NOTE — Progress Notes (Signed)
Complaint:  Visit Type: Patient returns to my office for continued preventative foot care services. Complaint: Patient states" my nails have grown long and thick and become painful to walk and wear shoes" Patient has been diagnosed with DM with no foot complications. The patient presents for preventative foot care services. No changes to ROS.  He says he has picked up his new diabetic shoes and says they are doing well.  Podiatric Exam: Vascular: dorsalis pedis and posterior tibial pulses are palpable bilateral. Capillary return is immediate. Temperature gradient is WNL. Skin turgor WNL  Sensorium: Diminished  Semmes Weinstein monofilament test. Normal tactile sensation bilaterally. Nail Exam: Pt has thick disfigured discolored nails with subungual debris noted bilateral entire nail hallux through fifth toenails Ulcer Exam: There is no evidence of ulcer or pre-ulcerative changes or infection. Orthopedic Exam: Muscle tone and strength are WNL. No limitations in general ROM. No crepitus or effusions noted. Foot type and digits show no abnormalities. HAV  B/L with hammer toes. Skin: No Porokeratosis. No infection or ulcers  Diagnosis:  Onychomycosis, , Pain in right toe, pain in left toes  Treatment & Plan Procedures and Treatment: Consent by patient was obtained for treatment procedures. The patient understood the discussion of treatment and procedures well. All questions were answered thoroughly reviewed. Debridement of mycotic and hypertrophic toenails, 1 through 5 bilateral and clearing of subungual debris. No ulceration, no infection noted.  Return Visit-Office Procedure: Patient instructed to return to the office for a follow up visit 3 months for continued evaluation and treatment.    Gardiner Barefoot DPM

## 2017-04-13 ENCOUNTER — Emergency Department: Payer: Medicare Other

## 2017-04-13 ENCOUNTER — Encounter: Payer: Self-pay | Admitting: Emergency Medicine

## 2017-04-13 ENCOUNTER — Observation Stay
Admission: EM | Admit: 2017-04-13 | Discharge: 2017-04-14 | Disposition: A | Discharge status: Home / Self Care | Payer: Medicare Other | Attending: Internal Medicine | Admitting: Internal Medicine

## 2017-04-13 DIAGNOSIS — Z87891 Personal history of nicotine dependence: Secondary | ICD-10-CM | POA: Insufficient documentation

## 2017-04-13 DIAGNOSIS — Z951 Presence of aortocoronary bypass graft: Secondary | ICD-10-CM | POA: Insufficient documentation

## 2017-04-13 DIAGNOSIS — Z79899 Other long term (current) drug therapy: Secondary | ICD-10-CM | POA: Diagnosis not present

## 2017-04-13 DIAGNOSIS — E669 Obesity, unspecified: Secondary | ICD-10-CM | POA: Diagnosis not present

## 2017-04-13 DIAGNOSIS — Z7902 Long term (current) use of antithrombotics/antiplatelets: Secondary | ICD-10-CM | POA: Diagnosis not present

## 2017-04-13 DIAGNOSIS — Z7982 Long term (current) use of aspirin: Secondary | ICD-10-CM | POA: Insufficient documentation

## 2017-04-13 DIAGNOSIS — E785 Hyperlipidemia, unspecified: Secondary | ICD-10-CM | POA: Diagnosis present

## 2017-04-13 DIAGNOSIS — I2510 Atherosclerotic heart disease of native coronary artery without angina pectoris: Secondary | ICD-10-CM | POA: Diagnosis not present

## 2017-04-13 DIAGNOSIS — W2209XA Striking against other stationary object, initial encounter: Secondary | ICD-10-CM | POA: Diagnosis not present

## 2017-04-13 DIAGNOSIS — Z6836 Body mass index (BMI) 36.0-36.9, adult: Secondary | ICD-10-CM | POA: Diagnosis not present

## 2017-04-13 DIAGNOSIS — Z8673 Personal history of transient ischemic attack (TIA), and cerebral infarction without residual deficits: Secondary | ICD-10-CM | POA: Diagnosis not present

## 2017-04-13 DIAGNOSIS — N401 Enlarged prostate with lower urinary tract symptoms: Secondary | ICD-10-CM | POA: Insufficient documentation

## 2017-04-13 DIAGNOSIS — I509 Heart failure, unspecified: Secondary | ICD-10-CM | POA: Diagnosis not present

## 2017-04-13 DIAGNOSIS — I110 Hypertensive heart disease with heart failure: Secondary | ICD-10-CM | POA: Diagnosis not present

## 2017-04-13 DIAGNOSIS — E119 Type 2 diabetes mellitus without complications: Secondary | ICD-10-CM | POA: Diagnosis not present

## 2017-04-13 DIAGNOSIS — Z7951 Long term (current) use of inhaled steroids: Secondary | ICD-10-CM | POA: Diagnosis not present

## 2017-04-13 DIAGNOSIS — G4730 Sleep apnea, unspecified: Secondary | ICD-10-CM | POA: Diagnosis not present

## 2017-04-13 DIAGNOSIS — I10 Essential (primary) hypertension: Secondary | ICD-10-CM | POA: Diagnosis present

## 2017-04-13 DIAGNOSIS — N138 Other obstructive and reflux uropathy: Secondary | ICD-10-CM | POA: Diagnosis not present

## 2017-04-13 DIAGNOSIS — Z794 Long term (current) use of insulin: Secondary | ICD-10-CM | POA: Insufficient documentation

## 2017-04-13 DIAGNOSIS — S7012XA Contusion of left thigh, initial encounter: Principal | ICD-10-CM | POA: Insufficient documentation

## 2017-04-13 DIAGNOSIS — M79605 Pain in left leg: Secondary | ICD-10-CM

## 2017-04-13 DIAGNOSIS — Z955 Presence of coronary angioplasty implant and graft: Secondary | ICD-10-CM | POA: Insufficient documentation

## 2017-04-13 DIAGNOSIS — Z96652 Presence of left artificial knee joint: Secondary | ICD-10-CM | POA: Insufficient documentation

## 2017-04-13 DIAGNOSIS — T148XXA Other injury of unspecified body region, initial encounter: Secondary | ICD-10-CM | POA: Diagnosis present

## 2017-04-13 LAB — CBC WITH DIFFERENTIAL/PLATELET
BASOS ABS: 0 10*3/uL (ref 0–0.1)
BASOS PCT: 1 %
Eosinophils Absolute: 0.1 10*3/uL (ref 0–0.7)
Eosinophils Relative: 2 %
HEMATOCRIT: 42.4 % (ref 40.0–52.0)
Hemoglobin: 15 g/dL (ref 13.0–18.0)
LYMPHS PCT: 16 %
Lymphs Abs: 0.9 10*3/uL — ABNORMAL LOW (ref 1.0–3.6)
MCH: 32.5 pg (ref 26.0–34.0)
MCHC: 35.3 g/dL (ref 32.0–36.0)
MCV: 92.1 fL (ref 80.0–100.0)
MONO ABS: 0.7 10*3/uL (ref 0.2–1.0)
Monocytes Relative: 12 %
NEUTROS ABS: 4 10*3/uL (ref 1.4–6.5)
Neutrophils Relative %: 69 %
PLATELETS: 127 10*3/uL — AB (ref 150–440)
RBC: 4.6 MIL/uL (ref 4.40–5.90)
RDW: 13.1 % (ref 11.5–14.5)
WBC: 5.8 10*3/uL (ref 3.8–10.6)

## 2017-04-13 LAB — BASIC METABOLIC PANEL
ANION GAP: 13 (ref 5–15)
BUN: 25 mg/dL — ABNORMAL HIGH (ref 6–20)
CALCIUM: 9 mg/dL (ref 8.9–10.3)
CO2: 23 mmol/L (ref 22–32)
Chloride: 102 mmol/L (ref 101–111)
Creatinine, Ser: 1.14 mg/dL (ref 0.61–1.24)
GLUCOSE: 160 mg/dL — AB (ref 65–99)
POTASSIUM: 3.8 mmol/L (ref 3.5–5.1)
SODIUM: 138 mmol/L (ref 135–145)

## 2017-04-13 LAB — APTT: APTT: 29 s (ref 24–36)

## 2017-04-13 LAB — PROTIME-INR
INR: 1.04
Prothrombin Time: 13.5 seconds (ref 11.4–15.2)

## 2017-04-13 MED ORDER — IOPAMIDOL (ISOVUE-370) INJECTION 76%
125.0000 mL | Freq: Once | INTRAVENOUS | Status: AC | PRN
  Administered 2017-04-13: 125 mL via INTRAVENOUS

## 2017-04-13 MED ORDER — FENTANYL CITRATE (PF) 100 MCG/2ML IJ SOLN
25.0000 ug | Freq: Once | INTRAMUSCULAR | Status: AC
  Administered 2017-04-14: 25 ug via INTRAVENOUS
  Filled 2017-04-13: qty 2

## 2017-04-13 NOTE — ED Notes (Signed)
Blood obtained but iv infiltrated. Ultrasound iv ordered

## 2017-04-13 NOTE — ED Provider Notes (Signed)
[facility] Emergency Department Provider Note  ____________________________________________  Time seen: Approximately 11:16 PM  I have reviewed the triage vital signs and the nursing notes.   HISTORY  Chief Complaint Leg Injury   HPI [name] Ferguson is a 76 y.o. male who presents for evaluation of left leg injury. Patient reports that he ran into a corner next to a large metal dumpster to urinate when he hit his leg on it. after an hour patient started noting progressively worsening swelling of the leg and pain. Is complaining of mild pain at rest and becomes severe with weightbearing, constant, throbbing, located in the left thigh region. Patient is on Plavix. Patient is able to bear weight. No other injuries.no paresthesias or numbness of his extremity.  Past Medical History:  Diagnosis Date  . Angina pectoris ([place])   . Atherosclerosis with limb claudication ([place])   . BPH (benign prostatic hypertrophy) with urinary obstruction   . CHF (congestive heart failure) ([place])   . Diabetes mellitus without complication ([place])   . HLD (hyperlipidemia)   . Hypertension   . Obesity   . Penile adhesions   . Sleep apnea   . SOB (shortness of breath)   . Stroke [facility])     Patient Active Problem List   Diagnosis Date Noted  . BPH with obstruction/lower urinary tract symptoms 09/22/2015  . Special screening for malignant neoplasms, colon   . Rectal polyp   . Adiposity 03/18/2014  . Heart failure ([place]) 11/07/2013  . Type 2 diabetes mellitus treated with insulin ([place]) 04/08/2011  . HLD (hyperlipidemia) 04/08/2011  . Arthritis of knee, degenerative 04/08/2011  . DIABETES MELLITUS, TYPE II 05/15/2007  . HYPERLIPIDEMIA 05/15/2007  . HYPERTENSION 05/15/2007  . CORONARY ARTERY DISEASE 05/15/2007  . DYSPNEA ON EXERTION 05/15/2007  . TOBACCO ABUSE, HX OF 05/15/2007  . CORONARY ARTERY BYPASS GRAFT, HX OF 05/15/2007    Past Surgical History:  Procedure Laterality  Date  . BACK SURGERY    . CARDIAC CATHETERIZATION    . COLONOSCOPY    . COLONOSCOPY WITH PROPOFOL N/A 09/22/2015   Procedure: COLONOSCOPY WITH PROPOFOL;  Surgeon: Lucilla Lame, MD;  Location: [facility] ENDOSCOPY;  Service: Endoscopy;  Laterality: N/A;  . CORONARY ARTERY BYPASS GRAFT  1996  . HEMORRHOID SURGERY  2007  . HEMORRHOID SURGERY  08-12-15   excision internal hemorrhoid Dr Jamal Collin  . JOINT REPLACEMENT Left 2004   Knee    Prior to Admission medications   Medication Sig Start Date End Date Taking? Authorizing Provider  aspirin 81 MG tablet Take 81 mg by mouth daily.    [provider]  carvedilol (COREG) 12.5 MG tablet  07/14/13   [provider]  CINNAMON PO Take by mouth.    [provider]  clopidogrel (PLAVIX) 75 MG tablet  06/02/13   [provider]  dorzolamide (TRUSOPT) 2 % ophthalmic solution Administer 2 drops to both eyes Two (2) times a day. 02/27/15   [provider]  fenofibrate (TRICOR) 145 MG tablet  08/02/13   [provider]  fluticasone (FLONASE) 50 MCG/ACT nasal spray U 1 SPR NASALLY BID 08/12/16   [provider]  furosemide (LASIX) 40 MG tablet Take 40 mg by mouth. Patient states 60 in the am and 40 in the pm 02/11/15   [provider]  glucosamine-chondroitin 500-400 MG tablet Take 1 tablet by mouth daily.    [provider]  insulin aspart (NOVOLOG) 100 UNIT/ML injection Inject into the  skin 3 (three) times daily with meals.    [provider]  Insulin Detemir (LEVEMIR FLEXTOUCH) 100 UNIT/ML Pen Inject 50 Units into the skin.  01/05/15   [provider]  insulin lispro (HUMALOG KWIKPEN) 100 UNIT/ML KiwkPen  03/22/15   [provider]  Insulin Pen Needle (UNIFINE PENTIPS) 31G X 8 MM MISC as directed 11/24/10   [provider]  isosorbide mononitrate (IMDUR) 30 MG 24 hr tablet Take 30 mg by mouth daily.  06/10/13   [provider]  latanoprost (XALATAN)  0.005 % ophthalmic solution Apply to eye. 03/12/08   [provider]  Lecithin 1200 MG CAPS Take by mouth daily.    [provider]  lisinopril (PRINIVIL,ZESTRIL) 40 MG tablet Take 20 mg by mouth daily.  06/02/13   [provider]  magnesium oxide (MAG-OX) 400 MG tablet Take by mouth. 06/10/16 06/10/17  [provider]  metFORMIN (GLUCOPHAGE) 1000 MG tablet 1,000 mg 2 (two) times daily with a meal.  06/10/13   [provider]  metoprolol succinate (TOPROL-XL) 25 MG 24 hr tablet  11/02/16   [provider]  Multiple Vitamin (MULTI-VITAMINS) TABS Take by mouth.    [provider]  nitroGLYCERIN (NITROSTAT) 0.4 MG SL tablet Place under the tongue. 10/30/14   [provider]  Omega-3 Fatty Acids (FISH OIL PO) Take by mouth.    [provider]  Omega-3 Fatty Acids (FISH OIL) 1000 MG CAPS Take 2 capsules by mouth 2 (two) times daily.     [provider]  pravastatin (PRAVACHOL) 40 MG tablet Take 40 mg by mouth daily.    [provider]  SIMBRINZA 1-0.2 % SUSP  12/23/16   [provider]  spironolactone (ALDACTONE) 25 MG tablet Take 25 mg by mouth daily.    [provider]  tamsulosin (FLOMAX) 0.4 MG CAPS capsule TAKE 1 CAPSULE(0.4 MG) BY MOUTH DAILY 12/27/16   Ernestine Conrad, Larene Beach A, PA-C  torsemide (DEMADEX) 20 MG tablet  12/21/16   [provider]  triamcinolone cream (KENALOG) 0.1 %  09/22/16   [provider]  vitamin B-12 (CYANOCOBALAMIN) 500 MCG tablet Take 500 mcg by mouth daily.    [provider]  vitamin C (ASCORBIC ACID) 500 MG tablet Take 500 mg by mouth daily.    [provider]    Allergies Statins  Family History  Problem Relation Age of Onset  . Diabetes Mother   . Alzheimer's disease Mother   . Cancer Sister   . Heart disease Unknown        Family members in general  . Diabetes Brother   . Prostate cancer Neg Hx   . Bladder Cancer Neg Hx    . Kidney disease Neg Hx     Social History Social History  Substance Use Topics  . Smoking status: Former Smoker    Quit date: 07/11/1981  . Smokeless tobacco: Never Used  . Alcohol use 0.0 oz/week     Comment: occasionally    Review of Systems  Constitutional: Negative for fever. Eyes: Negative for visual changes. ENT: Negative for sore throat. Neck: No neck pain  Cardiovascular: Negative for chest pain. Respiratory: Negative for shortness of breath. Gastrointestinal: Negative for abdominal pain, vomiting or diarrhea. Genitourinary: Negative for dysuria. Musculoskeletal: Negative for back pain. + L left pain and swelling Skin: Negative for rash. Neurological: Negative for headaches, weakness or numbness. Psych: No SI or HI  ____________________________________________   PHYSICAL EXAM:  VITAL SIGNS:  ED Triage Vitals  Enc Vitals Group     BP 04/13/17 2040 140/73     Pulse Rate 04/13/17 2040 70     Resp 04/13/17 2040 20     Temp 04/13/17 2040 97.9 F (36.6 C)     Temp Source 04/13/17 2040 Oral     SpO2 04/13/17 2040 98 %     Weight 04/13/17 2040 238 lb (108 kg)     Height 04/13/17 2040 5\' 8"  (1.727 m)     Head Circumference --      Peak Flow --      Pain Score 04/13/17 2038 9     Pain Loc --      Pain Edu? --      Excl. in [place]? --     Constitutional: Alert and oriented. Well appearing and in no apparent distress. HEENT:      Head: Normocephalic and atraumatic.         Eyes: Conjunctivae are normal. Sclera is non-icteric.       Mouth/Throat: Mucous membranes are moist.       Neck: Supple with no signs of meningismus. Cardiovascular: Regular rate and rhythm. No murmurs, gallops, or rubs. 2+ symmetrical distal pulses are present in all extremities. No JVD. Respiratory: Normal respiratory effort. Lungs are clear to auscultation bilaterally. No wheezes, crackles, or rhonchi.  Gastrointestinal: Soft, non tender, and non distended with positive bowel sounds. No  rebound or guarding. Musculoskeletal: Anterior left thigh area is rigid, normal color of skin, no ecchymoses or bruises, no deformity, no laceration. Leg is warm and well perfused with brisk cap refill and normal distal pulses. Full painless ROM of all joints, no pain with dorsiflexion or plantarflexion of foot/ toe  Neurologic: Normal speech and language. Face is symmetric. Moving all extremities. No gross focal neurologic deficits are appreciated. Skin: Skin is warm, dry and intact. No rash noted. Psychiatric: Mood and affect are normal. Speech and behavior are normal.  ____________________________________________   LABS (all labs ordered are listed, but only abnormal results are displayed)  Labs Reviewed  BASIC METABOLIC PANEL - Abnormal; Notable for the following:       Result Value   Glucose, Bld 160 (*)    BUN 25 (*)    All other components within normal limits  CBC WITH DIFFERENTIAL/PLATELET - Abnormal; Notable for the following:    Platelets 127 (*)    Lymphs Abs 0.9 (*)    All other components within normal limits  PROTIME-INR  APTT   _________________________________________  EKG   none ____________________________________________  RADIOLOGY  XR L femur: Negative  CTA leg:  1. Scattered arterial vascular calcifications without significant stenosis. No contrast extravasation or dissection to suggest vascular injury. 2. Heterogeneous appearance enlargement of the vastus intermedius musculature suggesting intramuscular hematoma. 3. Moderate left knee effusion. Previous left total knee replacement. ____________________________________________   PROCEDURES  Procedure(s) performed: None Procedures Critical Care performed:  None ____________________________________________   INITIAL IMPRESSION / ASSESSMENT AND PLAN / ED COURSE   76 y.o. male who presents for evaluation of left leg injury after hitting his leg in a dumpster. Leg is swollen in the area of  anterior thigh, tense/ rigid anteriorly, no bruising, normal refill and pulses, no deformity. No evidence of compartment syndrome at this time. XR negative for fracture. CTA done to rule out bleeding.    _________________________ 11:33 PM on 04/13/2017 -----------------------------------------  CT concerning for intramuscular hematoma with no active bleeding. Patient attempted to walk but unable  to do so due to significant pain. The pain is minimal at rest and not exacerbated by dorsiflexion/plantarflexion of the toe and ankle with no evidence of compartment syndrome at this time. Due to rigidity of anterior thigh compartment which developed rather quickly and the fact that patient is on Plavix, unable to walk due to pain, I will admit patient for close monitoring for compartment syndrome.   Pertinent labs & imaging results that were available during my care of the patient were reviewed by me and considered in my medical decision making (see chart for details).    ____________________________________________   FINAL CLINICAL IMPRESSION(S) / ED DIAGNOSES  Final diagnoses:  Left leg pain  Intramuscular hematoma      NEW MEDICATIONS STARTED DURING THIS VISIT:  New Prescriptions   No medications on file     Note:  This document was prepared using Dragon voice recognition software and may include unintentional dictation errors.    Rudene Re, MD 04/13/17 [phone]

## 2017-04-13 NOTE — ED Triage Notes (Signed)
Pt states he ran into corner of dumpster hitting left thigh. States as able to walk initially but now pain becoming worse. Swelling noted to left upper thigh.

## 2017-04-13 NOTE — ED Notes (Signed)
Pt to ct 

## 2017-04-14 DIAGNOSIS — S7012XA Contusion of left thigh, initial encounter: Secondary | ICD-10-CM | POA: Diagnosis not present

## 2017-04-14 LAB — GLUCOSE, CAPILLARY
GLUCOSE-CAPILLARY: 248 mg/dL — AB (ref 65–99)
Glucose-Capillary: 215 mg/dL — ABNORMAL HIGH (ref 65–99)

## 2017-04-14 LAB — BASIC METABOLIC PANEL
ANION GAP: 8 (ref 5–15)
BUN: 23 mg/dL — ABNORMAL HIGH (ref 6–20)
CO2: 25 mmol/L (ref 22–32)
Calcium: 8.8 mg/dL — ABNORMAL LOW (ref 8.9–10.3)
Chloride: 103 mmol/L (ref 101–111)
Creatinine, Ser: 1.08 mg/dL (ref 0.61–1.24)
GFR calc Af Amer: >60 mL/min (ref >60)
GFR calc non Af Amer: >60 mL/min (ref >60)
GLUCOSE: 257 mg/dL — AB (ref 65–99)
POTASSIUM: 3.4 mmol/L — AB (ref 3.5–5.1)
Sodium: 136 mmol/L (ref 135–145)

## 2017-04-14 LAB — CBC
HEMATOCRIT: 37.9 % — AB (ref 40.0–52.0)
HEMOGLOBIN: 13.3 g/dL (ref 13.0–18.0)
MCH: 31.8 pg (ref 26.0–34.0)
MCHC: 35.2 g/dL (ref 32.0–36.0)
MCV: 90.3 fL (ref 80.0–100.0)
Platelets: 181 10*3/uL (ref 150–440)
RBC: 4.2 MIL/uL — AB (ref 4.40–5.90)
RDW: 13.2 % (ref 11.5–14.5)
WBC: 6.4 10*3/uL (ref 3.8–10.6)

## 2017-04-14 MED ORDER — PRAVASTATIN SODIUM 20 MG PO TABS
40.0000 mg | ORAL_TABLET | Freq: Every day | ORAL | Status: DC
  Administered 2017-04-14: 40 mg via ORAL
  Filled 2017-04-14: qty 2

## 2017-04-14 MED ORDER — CLOPIDOGREL BISULFATE 75 MG PO TABS
75.0000 mg | ORAL_TABLET | Freq: Every day | ORAL | Status: DC
  Administered 2017-04-14: 75 mg via ORAL
  Filled 2017-04-14: qty 1

## 2017-04-14 MED ORDER — ASPIRIN 81 MG PO CHEW
81.0000 mg | CHEWABLE_TABLET | Freq: Every day | ORAL | Status: DC
  Administered 2017-04-14: 81 mg via ORAL
  Filled 2017-04-14: qty 1

## 2017-04-14 MED ORDER — INSULIN ASPART 100 UNIT/ML ~~LOC~~ SOLN
0.0000 [IU] | Freq: Three times a day (TID) | SUBCUTANEOUS | Status: DC
  Administered 2017-04-14: 1 [IU] via SUBCUTANEOUS
  Filled 2017-04-14: qty 1

## 2017-04-14 MED ORDER — INSULIN ASPART 100 UNIT/ML ~~LOC~~ SOLN: 0.0000 [IU] | Freq: Every day | SUBCUTANEOUS | Status: DC

## 2017-04-14 MED ORDER — TORSEMIDE 20 MG PO TABS
30.0000 mg | ORAL_TABLET | Freq: Every day | ORAL | Status: DC
  Administered 2017-04-14: 30 mg via ORAL
  Filled 2017-04-14: qty 2

## 2017-04-14 MED ORDER — LATANOPROST 0.005 % OP SOLN
1.0000 [drp] | Freq: Every day | OPHTHALMIC | Status: DC
  Filled 2017-04-14: qty 2.5

## 2017-04-14 MED ORDER — ISOSORBIDE MONONITRATE ER 30 MG PO TB24
30.0000 mg | ORAL_TABLET | Freq: Every day | ORAL | Status: DC
  Administered 2017-04-14: 30 mg via ORAL
  Filled 2017-04-14: qty 1

## 2017-04-14 MED ORDER — METOPROLOL SUCCINATE ER 25 MG PO TB24
12.5000 mg | ORAL_TABLET | Freq: Every day | ORAL | Status: DC
  Administered 2017-04-14: 12.5 mg via ORAL
  Filled 2017-04-14: qty 1

## 2017-04-14 MED ORDER — ACETAMINOPHEN 650 MG RE SUPP: 650.0000 mg | Freq: Four times a day (QID) | RECTAL | Status: DC | PRN

## 2017-04-14 MED ORDER — TAMSULOSIN HCL 0.4 MG PO CAPS
0.4000 mg | ORAL_CAPSULE | Freq: Every day | ORAL | Status: DC
  Administered 2017-04-14: 0.4 mg via ORAL
  Filled 2017-04-14: qty 1

## 2017-04-14 MED ORDER — TORSEMIDE 20 MG PO TABS: 20.0000 mg | ORAL_TABLET | Freq: Two times a day (BID) | ORAL | Status: DC

## 2017-04-14 MED ORDER — ONDANSETRON HCL 4 MG PO TABS: 4.0000 mg | ORAL_TABLET | Freq: Four times a day (QID) | ORAL | Status: DC | PRN

## 2017-04-14 MED ORDER — TORSEMIDE 20 MG PO TABS: 20.0000 mg | ORAL_TABLET | Freq: Every day | ORAL | Status: DC

## 2017-04-14 MED ORDER — LISINOPRIL 5 MG PO TABS
2.5000 mg | ORAL_TABLET | Freq: Every day | ORAL | Status: DC
  Administered 2017-04-14: 2.5 mg via ORAL
  Filled 2017-04-14: qty 1

## 2017-04-14 MED ORDER — OXYCODONE HCL 5 MG PO TABS: 5.0000 mg | ORAL_TABLET | ORAL | 0 refills | Status: DC | PRN

## 2017-04-14 MED ORDER — OXYCODONE HCL 5 MG PO TABS
5.0000 mg | ORAL_TABLET | ORAL | Status: DC | PRN
  Administered 2017-04-14: 5 mg via ORAL
  Filled 2017-04-14: qty 1

## 2017-04-14 MED ORDER — MORPHINE SULFATE (PF) 4 MG/ML IV SOLN: 4.0000 mg | INTRAVENOUS | Status: DC | PRN

## 2017-04-14 MED ORDER — ONDANSETRON HCL 4 MG/2ML IJ SOLN
4.0000 mg | Freq: Four times a day (QID) | INTRAMUSCULAR | Status: DC | PRN
Start: 2017-04-14 — End: 2017-04-14

## 2017-04-14 MED ORDER — ACETAMINOPHEN 325 MG PO TABS: 650.0000 mg | ORAL_TABLET | Freq: Four times a day (QID) | ORAL | Status: DC | PRN

## 2017-04-14 NOTE — Discharge Summary (Signed)
[name] Ferguson, is a 76 y.o. male  DOB [birth year]  MRN [id number].  Admission date:  04/13/2017  Admitting Physician  Lance Coon, MD  Discharge Date:  04/14/2017   Primary MD  Cletis Athens, MD  Recommendations for primary care physician for things to follow:   Follow-up with PCP in 1 week   Admission Diagnosis  Left leg pain [M79.605] Intramuscular hematoma [T14.8XXA]   Discharge Diagnosis  Left leg pain [M79.605] Intramuscular hematoma [T14.8XXA]    Principal Problem:   Intramuscular hematoma Active Problems:   Diabetes ([place])   Essential hypertension   Coronary atherosclerosis   HLD (hyperlipidemia)      Past Medical History:  Diagnosis Date  . Angina pectoris ([place])   . Atherosclerosis with limb claudication ([place])   . BPH (benign prostatic hypertrophy) with urinary obstruction   . CHF (congestive heart failure) ([place])   . Diabetes mellitus without complication ([place])   . HLD (hyperlipidemia)   . Hypertension   . Obesity   . Penile adhesions   . Sleep apnea   . SOB (shortness of breath)   . Stroke [facility])     Past Surgical History:  Procedure Laterality Date  . BACK SURGERY    . CARDIAC CATHETERIZATION    . COLONOSCOPY    . COLONOSCOPY WITH PROPOFOL N/A 09/22/2015   Procedure: COLONOSCOPY WITH PROPOFOL;  Surgeon: Lucilla Lame, MD;  Location: [facility] ENDOSCOPY;  Service: Endoscopy;  Laterality: N/A;  . CORONARY ARTERY BYPASS GRAFT  1996  . HEMORRHOID SURGERY  2007  . HEMORRHOID SURGERY  08-12-15   excision internal hemorrhoid Dr Jamal Collin  . JOINT REPLACEMENT Left 2004   Knee       History of present illness and  Hospital Course:     Kindly see H&P for history of present illness and admission details, please review complete Labs, Consult reports and Test reports for all details in brief  HPI  from the  history and physical done on the day of admission  76 year old male patient admitted this morning for leg injury, left thigh hematoma. Found to have intramuscular hematoma.  Hospital Course  Intramuscular hematoma. The left thigh secondary to injury from*dump ster. Patient did not lose consciousness. And he feels much better today, hematoma is decreasing. Advised the patient to use cold compressions as needed. Patient is stable for discharge home today. Patient can follow up with Dr. muscles if symptoms worsen to have orthopedic evaluation is advised to come to the ER. And he has history of coronary artery disease and multiple stent placements so he needs to be on aspirin, Plavix at this time. Range of motion of the left knee is within normal range. Patient denies any pain issues and did not require any pain medicines so he is stable for discharge.  #2. Hypertension, coronary artery disease: Continue home medications #3 diabetes mellitus type 2: Continue her medication.   Discharge Condition: stable.   Follow UP  Follow-up Information    Cletis Athens, MD Follow up in 1 week(s).   Specialty:  Internal Medicine Contact information: [place] [address] [phone]        Casilda Carls, MD .   Specialty:  Internal Medicine Contact information: [address] [place] [address] [phone]             Discharge Instructions  and  Discharge Medications      Allergies as of 04/14/2017      Reactions   Statins Other (See  Comments)   Suspected cause of muscle pain      Medication List    TAKE these medications   aspirin 81 MG tablet Take 81 mg by mouth daily.   clopidogrel 75 MG tablet Commonly known as:  PLAVIX Take 75 mg by mouth daily.   Fish Oil 1000 MG Caps Take 4 capsules by mouth 2 (two) times daily. 4 capsules in morning and one at night   glucosamine-chondroitin 500-400 MG tablet Take 1 tablet by mouth daily.   insulin aspart  100 UNIT/ML injection Commonly known as:  novoLOG Inject 0-10 Units into the skin 3 (three) times daily before meals. Per sliding scale   isosorbide mononitrate 30 MG 24 hr tablet Commonly known as:  IMDUR Take 30 mg by mouth daily.   LEVEMIR FLEXTOUCH 100 UNIT/ML Pen Generic drug:  Insulin Detemir Inject 35-40 Units into the skin at bedtime. Says 50 units is prescribed but will usually take 35-40 at night   lisinopril 2.5 MG tablet Commonly known as:  PRINIVIL,ZESTRIL Take 2.5 mg by mouth daily.   metFORMIN 1000 MG tablet Commonly known as:  GLUCOPHAGE 1,000 mg 2 (two) times daily with a meal.   metoprolol succinate 25 MG 24 hr tablet Commonly known as:  TOPROL-XL Take 12.5 mg by mouth daily.   nitroGLYCERIN 0.4 MG SL tablet Commonly known as:  NITROSTAT Place under the tongue.   pravastatin 40 MG tablet Commonly known as:  PRAVACHOL Take 40 mg by mouth daily.   SIMBRINZA 1-0.2 % Susp Generic drug:  Brinzolamide-Brimonidine Apply 1 drop to eye 2 (two) times daily.   tamsulosin 0.4 MG Caps capsule Commonly known as:  FLOMAX TAKE 1 CAPSULE(0.4 MG) BY MOUTH DAILY   torsemide 20 MG tablet Commonly known as:  DEMADEX Take 20-30 mg by mouth 2 (two) times daily. 30 mg in the morning and 20mg  in the evening   UNIFINE PENTIPS 31G X 8 MM Misc Generic drug:  Insulin Pen Needle as directed   vitamin B-12 500 MCG tablet Commonly known as:  CYANOCOBALAMIN Take 500 mcg by mouth daily.   vitamin C 500 MG tablet Commonly known as:  ASCORBIC ACID Take 500 mg by mouth daily.   XALATAN 0.005 % ophthalmic solution Generic drug:  latanoprost Place 1 drop into both eyes at bedtime.         Diet and Activity recommendation: See Discharge Instructions above   Consults obtained -none.   Major procedures and Radiology Reports - PLEASE review detailed and final reports for all details, in brief -      Ct Angio Low Extrem Left W &/or Wo Contrast  Result Date:  04/13/2017 CLINICAL DATA:  Penetrating trauma to the left upper leg. Worsening pain and swelling. A marker is placed on the area of interest. EXAM: CT ANGIOGRAPHY OF THE left lowerEXTREMITY TECHNIQUE: Multidetector CT imaging of the left lowerwas performed using the standard protocol during bolus administration of intravenous contrast. Multiplanar CT image reconstructions and MIPs were obtained to evaluate the vascular anatomy. CONTRAST:  125 mL Isovue 370 COMPARISON:  None. FINDINGS: Pelvic vessels: The visualized distal portion of the left external iliac artery is patent with mild scattered calcification. No evidence of stenosis. Left lower extremity vessels: The left common femoral, superficial femoral, deep femoral, and popliteal arteries are patent to the level of the tibial trunk. Scattered calcifications are present without evidence of critical stenosis. No contrast extravasation. Soft tissues: Corresponding to the marked area of interest, there is evidence of  expansion and loss of fat planes throughout the anterior compartment quadriceps musculature consistent with intramuscular hematoma. The hematoma appears to be mostly contained within the vastus intermedius musculature and measures about 3.6 x 6.6 cm in diameter. No contrast extravasation is demonstrated. There is a moderate-sized left knee effusion. Bones: Previous left total knee replacement. Surgical clips in the soft tissues. No acute fracture or focal bony abnormalities demonstrated. Review of the MIP images confirms the above findings. IMPRESSION: 1. Scattered arterial vascular calcifications without significant stenosis. No contrast extravasation or dissection to suggest vascular injury. 2. Heterogeneous appearance enlargement of the vastus intermedius musculature suggesting intramuscular hematoma. 3. Moderate left knee effusion. Previous left total knee replacement. Electronically Signed   By: Lucienne Capers M.D.   On: 04/13/2017 23:23   Dg  Femur Min 2 Views Left  Result Date: 04/13/2017 CLINICAL DATA:  Left leg pain after injury. Swelling to the left upper thigh. History of diabetes. EXAM: LEFT FEMUR 2 VIEWS COMPARISON:  None. FINDINGS: Left hip and left femur appear intact. No evidence of acute fracture or dislocation. The left total knee arthroplasty is demonstrated with incomplete visualization. Vascular calcifications and surgical clips in the soft tissues. IMPRESSION: No acute bony abnormalities. Electronically Signed   By: Lucienne Capers M.D.   On: 04/13/2017 21:59    Micro Results    No results found for this or any previous visit (from the past 240 hour(s)).     Today   Subjective:   [name] Ferguson today has no headache,no chest abdominal pain,no new weakness tingling or numbness, feels much better wants to go home today.  Objective:   Blood pressure 138/61, pulse 73, temperature 98.7 F (37.1 C), temperature source Oral, resp. rate 17, height 5\' 8"  (1.727 m), weight 108 kg (238 lb), SpO2 97 %.   Intake/Output Summary (Last 24 hours) at 04/14/17 1225 Last data filed at 04/14/17 0900  Gross per 24 hour  Intake              240 ml  Output                0 ml  Net              240 ml    Exam Awake Alert, Oriented x 3, No new F.N deficits, Normal affect [place].AT,PERRAL Supple Neck,No JVD, No cervical lymphadenopathy appriciated.  Symmetrical Chest wall movement, Good air movement bilaterally, CTAB RRR,No Gallops,Rubs or new Murmurs, No Parasternal Heave +ve B.Sounds, Abd Soft, Non tender, No organomegaly appriciated, No rebound -guarding or rigidity. No Cyanosis, Clubbing or edema, No new Rash or bruise  Data Review   CBC w Diff: Lab Results  Component Value Date   WBC 6.4 04/14/2017   HGB 13.3 04/14/2017   HCT 37.9 (L) 04/14/2017   PLT 181 04/14/2017   LYMPHOPCT 16 04/13/2017   MONOPCT 12 04/13/2017   EOSPCT 2 04/13/2017   BASOPCT 1 04/13/2017    CMP: Lab Results  Component Value Date   NA  136 04/14/2017   K 3.4 (L) 04/14/2017   CL 103 04/14/2017   CO2 25 04/14/2017   BUN 23 (H) 04/14/2017   CREATININE 1.08 04/14/2017  .   Total Time in preparing paper work, data evaluation and todays exam - 3 minutes  Lindley Hiney M.D on 04/14/2017 at 12:25 PM    Note: This dictation was prepared with Dragon dictation along with smaller phrase technology. Any transcriptional errors that result from this process are unintentional.

## 2017-04-14 NOTE — ED Notes (Signed)
Dr. Jannifer Franklin at pt's bedside at this time.

## 2017-04-14 NOTE — H&P (Signed)
[place] [facility] [place] NAME: [name] Ferguson    MR#:  [id number]  DATE OF BIRTH:  [birth year]  DATE OF ADMISSION:  04/13/2017  PRIMARY CARE PHYSICIAN: Cletis Athens, MD   REQUESTING/REFERRING PHYSICIAN: Alfred Levins, MD  CHIEF COMPLAINT:   Chief Complaint  Patient presents with  . Leg Injury    HISTORY OF PRESENT ILLNESS:  [name] Ferguson  is a 76 y.o. male who presents with Traumatic intramuscular hematoma. A shunt hit his leg on a dumpster. Shortly after that he began swelling. He came into the ED for evaluation when it began hurting as well. Here today shows intramuscular hematoma. Patient had significant pain associated with this. Hospitalists were called for admission  PAST MEDICAL HISTORY:   Past Medical History:  Diagnosis Date  . Angina pectoris ([place])   . Atherosclerosis with limb claudication ([place])   . BPH (benign prostatic hypertrophy) with urinary obstruction   . CHF (congestive heart failure) ([place])   . Diabetes mellitus without complication ([place])   . HLD (hyperlipidemia)   . Hypertension   . Obesity   . Penile adhesions   . Sleep apnea   . SOB (shortness of breath)   . Stroke [facility])     PAST SURGICAL HISTORY:   Past Surgical History:  Procedure Laterality Date  . BACK SURGERY    . CARDIAC CATHETERIZATION    . COLONOSCOPY    . COLONOSCOPY WITH PROPOFOL N/A 09/22/2015   Procedure: COLONOSCOPY WITH PROPOFOL;  Surgeon: Lucilla Lame, MD;  Location: [facility] ENDOSCOPY;  Service: Endoscopy;  Laterality: N/A;  . CORONARY ARTERY BYPASS GRAFT  1996  . HEMORRHOID SURGERY  2007  . HEMORRHOID SURGERY  08-12-15   excision internal hemorrhoid Dr Jamal Collin  . JOINT REPLACEMENT Left 2004   Knee    SOCIAL HISTORY:   Social History  Substance Use Topics  . Smoking status: Former Smoker    Quit date: 07/11/1981  . Smokeless tobacco: Never Used  . Alcohol use 0.0 oz/week     Comment: occasionally    FAMILY HISTORY:   Family History   Problem Relation Age of Onset  . Diabetes Mother   . Alzheimer's disease Mother   . Cancer Sister   . Heart disease Unknown        Family members in general  . Diabetes Brother   . Prostate cancer Neg Hx   . Bladder Cancer Neg Hx   . Kidney disease Neg Hx     DRUG ALLERGIES:   Allergies  Allergen Reactions  . Statins Other (See Comments)    Suspected cause of muscle pain    MEDICATIONS AT HOME:   Prior to Admission medications   Medication Sig Start Date End Date Taking? Authorizing Provider  aspirin 81 MG tablet Take 81 mg by mouth daily.   Yes [provider]  Brinzolamide-Brimonidine (SIMBRINZA) 1-0.2 % SUSP Apply 1 drop to eye 2 (two) times daily.   Yes [provider]  clopidogrel (PLAVIX) 75 MG tablet Take 75 mg by mouth daily.    Yes [provider]  glucosamine-chondroitin 500-400 MG tablet Take 1 tablet by mouth daily.   Yes [provider]  insulin aspart (NOVOLOG) 100 UNIT/ML injection Inject 0-10 Units into the skin 3 (three) times daily before meals. Per sliding scale   Yes [provider]  Insulin Detemir (LEVEMIR FLEXTOUCH) 100 UNIT/ML Pen Inject 35-40 Units into the skin at bedtime. Says 50 units is prescribed but will  usually take 35-40 at night   Yes [provider]  Insulin Pen Needle (UNIFINE PENTIPS) 31G X 8 MM MISC as directed 11/24/10  Yes [provider]  isosorbide mononitrate (IMDUR) 30 MG 24 hr tablet Take 30 mg by mouth daily.  06/10/13  Yes [provider]  latanoprost (XALATAN) 0.005 % ophthalmic solution Place 1 drop into both eyes at bedtime.    Yes [provider]  lisinopril (PRINIVIL,ZESTRIL) 2.5 MG tablet Take 2.5 mg by mouth daily.   Yes [provider]  metFORMIN (GLUCOPHAGE) 1000 MG tablet 1,000 mg 2 (two) times daily with a meal.  06/10/13  Yes [provider]  metoprolol succinate (TOPROL-XL) 25 MG 24 hr tablet Take 12.5 mg by mouth daily.     Yes [provider]  nitroGLYCERIN (NITROSTAT) 0.4 MG SL tablet Place under the tongue. 10/30/14  Yes [provider]  Omega-3 Fatty Acids (FISH OIL) 1000 MG CAPS Take 4 capsules by mouth 2 (two) times daily. 4 capsules in morning and one at night   Yes [provider]  pravastatin (PRAVACHOL) 40 MG tablet Take 40 mg by mouth daily.   Yes [provider]  tamsulosin (FLOMAX) 0.4 MG CAPS capsule TAKE 1 CAPSULE(0.4 MG) BY MOUTH DAILY 12/27/16  Yes McGowan, Larene Beach A, PA-C  torsemide (DEMADEX) 20 MG tablet Take 20-30 mg by mouth 2 (two) times daily. 30 mg in the morning and 20mg  in the evening 12/21/16  Yes [provider]  vitamin B-12 (CYANOCOBALAMIN) 500 MCG tablet Take 500 mcg by mouth daily.   Yes [provider]  vitamin C (ASCORBIC ACID) 500 MG tablet Take 500 mg by mouth daily.    [provider]    REVIEW OF SYSTEMS:  Review of Systems  Constitutional: Negative for chills, fever, malaise/fatigue and weight loss.  HENT: Negative for ear pain, hearing loss and tinnitus.   Eyes: Negative for blurred vision, double vision, pain and redness.  Respiratory: Negative for cough, hemoptysis and shortness of breath.   Cardiovascular: Positive for leg swelling. Negative for chest pain, palpitations and orthopnea.  Gastrointestinal: Negative for abdominal pain, constipation, diarrhea, nausea and vomiting.  Genitourinary: Negative for dysuria, frequency and hematuria.  Musculoskeletal: Negative for back pain, joint pain and neck pain.       Leg pain  Skin:       No acne, rash, or lesions  Neurological: Negative for dizziness, tremors, focal weakness and weakness.  Endo/Heme/Allergies: Negative for polydipsia. Does not bruise/bleed easily.  Psychiatric/Behavioral: Negative for depression. The patient is not nervous/anxious and does not have insomnia.      VITAL SIGNS:   Vitals:   04/13/17 2256 04/13/17 2300 04/13/17 2330 04/14/17 0014   BP: 131/70 121/61 133/73 (!) 142/75  Pulse: 72 70 74 71  Resp:    16  Temp:      TempSrc:      SpO2: 97% 95% 95% 96%  Weight:      Height:       Wt Readings from Last 3 Encounters:  04/13/17 108 kg (238 lb)  12/27/16 108.4 kg (238 lb 14.4 oz)  06/27/16 102.7 kg (226 lb 8 oz)    PHYSICAL EXAMINATION:  Physical Exam  Vitals reviewed. Constitutional: He is oriented to person, place, and time. He appears well-developed and well-nourished. No distress.  HENT:  Head: Normocephalic and atraumatic.  Mouth/Throat: Oropharynx is clear and moist.  Eyes: Pupils are equal, round, and reactive to light. Conjunctivae and EOM  are normal. No scleral icterus.  Neck: Normal range of motion. Neck supple. No JVD present. No thyromegaly present.  Cardiovascular: Normal rate, regular rhythm and intact distal pulses.  Exam reveals no gallop and no friction rub.   No murmur heard. Respiratory: Effort normal and breath sounds normal. No respiratory distress. He has no wheezes. He has no rales.  GI: Soft. Bowel sounds are normal. He exhibits no distension. There is no tenderness.  Musculoskeletal: Normal range of motion. He exhibits tenderness (Left thigh tenderness and swelling with muscular spasm). He exhibits no edema.  No arthritis, no gout  Lymphadenopathy:    He has no cervical adenopathy.  Neurological: He is alert and oriented to person, place, and time. No cranial nerve deficit.  No dysarthria, no aphasia  Skin: Skin is warm and dry. No rash noted. No erythema.  Psychiatric: He has a normal mood and affect. His behavior is normal. Judgment and thought content normal.    LABORATORY PANEL:   CBC  Recent Labs Lab 04/13/17 2112  WBC 5.8  HGB 15.0  HCT 42.4  PLT 127*   ------------------------------------------------------------------------------------------------------------------  Chemistries   Recent Labs Lab 04/13/17 2112  NA 138  K 3.8  CL 102  CO2 23  GLUCOSE 160*  BUN  25*  CREATININE 1.14  CALCIUM 9.0   ------------------------------------------------------------------------------------------------------------------  Cardiac Enzymes No results for input(s): TROPONINI in the last 168 hours. ------------------------------------------------------------------------------------------------------------------  RADIOLOGY:  Ct Angio Low Extrem Left W &/or Wo Contrast  Result Date: 04/13/2017 CLINICAL DATA:  Penetrating trauma to the left upper leg. Worsening pain and swelling. A marker is placed on the area of interest. EXAM: CT ANGIOGRAPHY OF THE left lowerEXTREMITY TECHNIQUE: Multidetector CT imaging of the left lowerwas performed using the standard protocol during bolus administration of intravenous contrast. Multiplanar CT image reconstructions and MIPs were obtained to evaluate the vascular anatomy. CONTRAST:  125 mL Isovue 370 COMPARISON:  None. FINDINGS: Pelvic vessels: The visualized distal portion of the left external iliac artery is patent with mild scattered calcification. No evidence of stenosis. Left lower extremity vessels: The left common femoral, superficial femoral, deep femoral, and popliteal arteries are patent to the level of the tibial trunk. Scattered calcifications are present without evidence of critical stenosis. No contrast extravasation. Soft tissues: Corresponding to the marked area of interest, there is evidence of expansion and loss of fat planes throughout the anterior compartment quadriceps musculature consistent with intramuscular hematoma. The hematoma appears to be mostly contained within the vastus intermedius musculature and measures about 3.6 x 6.6 cm in diameter. No contrast extravasation is demonstrated. There is a moderate-sized left knee effusion. Bones: Previous left total knee replacement. Surgical clips in the soft tissues. No acute fracture or focal bony abnormalities demonstrated. Review of the MIP images confirms the above  findings. IMPRESSION: 1. Scattered arterial vascular calcifications without significant stenosis. No contrast extravasation or dissection to suggest vascular injury. 2. Heterogeneous appearance enlargement of the vastus intermedius musculature suggesting intramuscular hematoma. 3. Moderate left knee effusion. Previous left total knee replacement. Electronically Signed   By: Lucienne Capers M.D.   On: 04/13/2017 23:23   Dg Femur Min 2 Views Left  Result Date: 04/13/2017 CLINICAL DATA:  Left leg pain after injury. Swelling to the left upper thigh. History of diabetes. EXAM: LEFT FEMUR 2 VIEWS COMPARISON:  None. FINDINGS: Left hip and left femur appear intact. No evidence of acute fracture or dislocation. The left total knee arthroplasty is demonstrated with incomplete visualization. Vascular calcifications and  surgical clips in the soft tissues. IMPRESSION: No acute bony abnormalities. Electronically Signed   By: Lucienne Capers M.D.   On: 04/13/2017 21:59    EKG:   Orders placed or performed in visit on 11/06/06  . EKG 12-Lead    IMPRESSION AND PLAN:  Principal Problem:   Intramuscular hematoma - expect this to be self-limiting. We will admit him tonight, monitor closely, when necessary analgesia Active Problems:   Diabetes ([place]) - sliding scale insulin with corresponding glucose checks   Essential hypertension - continue home medications   Coronary atherosclerosis - continue home meds   HLD (hyperlipidemia) - home dose anti-lipid  All the records are reviewed and case discussed with ED provider. Management plans discussed with the patient and/or family.  DVT PROPHYLAXIS: Mechanical only  GI PROPHYLAXIS: None  ADMISSION STATUS: Observation  CODE STATUS: Full Code Status History    This patient does not have a recorded code status. Please follow your organizational policy for patients in this situation.      TOTAL TIME TAKING CARE OF THIS PATIENT: 40 minutes.   Jannifer Franklin, [name] Ferguson  [place] 04/14/2017, 12:53 AM  [facility]  Office  [phone]  CC: Primary care physician; Cletis Athens, MD  Note:  This document was prepared using Dragon voice recognition software and may include unintentional dictation errors.

## 2017-04-14 NOTE — Care Management Obs Status (Signed)
[place] NOTIFICATION   Patient Details  Name: [name] Ferguson MRN: [id number] Date of Birth: March 25, 1941   Medicare Observation Status Notification Given:  Yes    Jolly Mango, RN 04/14/2017, 11:17 AM

## 2017-04-14 NOTE — Progress Notes (Signed)
Patient is being discharged to home. Wife is here to take patient home. IV removed belongings packed. DC & Rx instructions given and patient acknowledged understanding.

## 2017-06-26 ENCOUNTER — Encounter: Payer: Self-pay | Admitting: Podiatry

## 2017-06-26 ENCOUNTER — Ambulatory Visit (INDEPENDENT_AMBULATORY_CARE_PROVIDER_SITE_OTHER): Payer: Medicare Other | Admitting: Podiatry

## 2017-06-26 DIAGNOSIS — D689 Coagulation defect, unspecified: Secondary | ICD-10-CM

## 2017-06-26 DIAGNOSIS — I639 Cerebral infarction, unspecified: Secondary | ICD-10-CM | POA: Insufficient documentation

## 2017-06-26 DIAGNOSIS — M2041 Other hammer toe(s) (acquired), right foot: Secondary | ICD-10-CM

## 2017-06-26 DIAGNOSIS — M79675 Pain in left toe(s): Secondary | ICD-10-CM

## 2017-06-26 DIAGNOSIS — M2010 Hallux valgus (acquired), unspecified foot: Secondary | ICD-10-CM

## 2017-06-26 DIAGNOSIS — M2042 Other hammer toe(s) (acquired), left foot: Secondary | ICD-10-CM

## 2017-06-26 DIAGNOSIS — B351 Tinea unguium: Secondary | ICD-10-CM | POA: Diagnosis not present

## 2017-06-26 DIAGNOSIS — E1149 Type 2 diabetes mellitus with other diabetic neurological complication: Secondary | ICD-10-CM

## 2017-06-26 DIAGNOSIS — M79674 Pain in right toe(s): Secondary | ICD-10-CM

## 2017-06-26 NOTE — Progress Notes (Signed)
Complaint:  Visit Type: Patient returns to my office for continued preventative foot care services. Complaint: Patient states" my nails have grown long and thick and become painful to walk and wear shoes" Patient has been diagnosed with DM with no foot complications. The patient presents for preventative foot care services. No changes to ROS.  He says he has picked up his new diabetic shoes and says they are doing well.  Podiatric Exam: Vascular: dorsalis pedis and posterior tibial pulses are palpable bilateral. Capillary return is immediate. Temperature gradient is WNL. Skin turgor WNL  Sensorium: Diminished  Semmes Weinstein monofilament test. Normal tactile sensation bilaterally. Nail Exam: Pt has thick disfigured discolored nails with subungual debris noted bilateral entire nail hallux through fifth toenails Ulcer Exam: There is no evidence of ulcer or pre-ulcerative changes or infection. Orthopedic Exam: Muscle tone and strength are WNL. No limitations in general ROM. No crepitus or effusions noted. Foot type and digits show no abnormalities. HAV  B/L with hammer toes. Skin: No Porokeratosis. No infection or ulcers  Diagnosis:  Onychomycosis, , Pain in right toe, pain in left toes  Treatment & Plan Procedures and Treatment: Consent by patient was obtained for treatment procedures. The patient understood the discussion of treatment and procedures well. All questions were answered thoroughly reviewed. Debridement of mycotic and hypertrophic toenails, 1 through 5 bilateral and clearing of subungual debris. No ulceration, no infection noted.  Return Visit-Office Procedure: Patient instructed to return to the office for a follow up visit 3 months for continued evaluation and treatment.    Gardiner Barefoot DPM

## 2017-09-25 ENCOUNTER — Ambulatory Visit: Payer: Medicare Other | Admitting: Podiatry

## 2017-09-28 ENCOUNTER — Encounter: Payer: Self-pay | Admitting: Podiatry

## 2017-09-28 ENCOUNTER — Ambulatory Visit (INDEPENDENT_AMBULATORY_CARE_PROVIDER_SITE_OTHER): Payer: Medicare Other | Admitting: Podiatry

## 2017-09-28 DIAGNOSIS — D689 Coagulation defect, unspecified: Secondary | ICD-10-CM

## 2017-09-28 DIAGNOSIS — M79675 Pain in left toe(s): Secondary | ICD-10-CM

## 2017-09-28 DIAGNOSIS — M2041 Other hammer toe(s) (acquired), right foot: Secondary | ICD-10-CM

## 2017-09-28 DIAGNOSIS — M79674 Pain in right toe(s): Secondary | ICD-10-CM

## 2017-09-28 DIAGNOSIS — B351 Tinea unguium: Secondary | ICD-10-CM | POA: Diagnosis not present

## 2017-09-28 DIAGNOSIS — M2010 Hallux valgus (acquired), unspecified foot: Secondary | ICD-10-CM

## 2017-09-28 DIAGNOSIS — E1149 Type 2 diabetes mellitus with other diabetic neurological complication: Secondary | ICD-10-CM

## 2017-09-28 DIAGNOSIS — M2042 Other hammer toe(s) (acquired), left foot: Secondary | ICD-10-CM

## 2017-09-28 NOTE — Progress Notes (Signed)
Complaint:  Visit Type: Patient returns to my office for continued preventative foot care services. Complaint: Patient states" my nails have grown long and thick and become painful to walk and wear shoes" Patient has been diagnosed with DM with no foot complications. The patient presents for preventative foot care services. No changes to ROS.  Patient says he desires new diabetic shoes.  Podiatric Exam: Vascular: dorsalis pedis and posterior tibial pulses are palpable bilateral. Capillary return is immediate. Temperature gradient is WNL. Skin turgor WNL  Sensorium: Diminished  Semmes Weinstein monofilament test. Normal tactile sensation bilaterally. Nail Exam: Pt has thick disfigured discolored nails with subungual debris noted bilateral entire nail hallux through fifth toenails Ulcer Exam: There is no evidence of ulcer or pre-ulcerative changes or infection. Orthopedic Exam: Muscle tone and strength are WNL. No limitations in general ROM. No crepitus or effusions noted. Foot type and digits show no abnormalities. HAV  B/L with hammer toes. Skin: No Porokeratosis. No infection or ulcers  Diagnosis:  Onychomycosis, , Pain in right toe, pain in left toes  Treatment & Plan Procedures and Treatment: Consent by patient was obtained for treatment procedures. The patient understood the discussion of treatment and procedures well. All questions were answered thoroughly reviewed. Debridement of mycotic and hypertrophic toenails, 1 through 5 bilateral and clearing of subungual debris. No ulceration, no infection noted.  Measured fir diabetic shoes for DPN, HAV and hammer toes  B/L. Return Visit-Office Procedure: Patient instructed to return to the office for a follow up visit 3 months for continued evaluation and treatment.    Gardiner Barefoot DPM

## 2017-10-12 DIAGNOSIS — C4492 Squamous cell carcinoma of skin, unspecified: Secondary | ICD-10-CM

## 2017-10-12 HISTORY — DX: Squamous cell carcinoma of skin, unspecified: C44.92

## 2017-10-18 ENCOUNTER — Ambulatory Visit (INDEPENDENT_AMBULATORY_CARE_PROVIDER_SITE_OTHER): Payer: Medicare Other | Admitting: Orthotics

## 2017-10-18 DIAGNOSIS — M2042 Other hammer toe(s) (acquired), left foot: Secondary | ICD-10-CM | POA: Diagnosis not present

## 2017-10-18 DIAGNOSIS — E1149 Type 2 diabetes mellitus with other diabetic neurological complication: Secondary | ICD-10-CM

## 2017-10-18 DIAGNOSIS — M2010 Hallux valgus (acquired), unspecified foot: Secondary | ICD-10-CM | POA: Diagnosis not present

## 2017-10-18 DIAGNOSIS — M2041 Other hammer toe(s) (acquired), right foot: Secondary | ICD-10-CM

## 2017-12-13 ENCOUNTER — Other Ambulatory Visit: Payer: Self-pay | Admitting: Urology

## 2017-12-13 DIAGNOSIS — N401 Enlarged prostate with lower urinary tract symptoms: Secondary | ICD-10-CM

## 2017-12-24 NOTE — Progress Notes (Signed)
12/27/2017 11:24 AM   [name] Ferguson 1940-11-28 [id number]  Referring provider: Cletis Athens, MD [address] [place], [place] [address]  Chief Complaint  Patient presents with  . Benign Prostatic Hypertrophy    HPI: 77 yo WM with BPH with LU TS, prostate nodules and a history of sepsis presents for a one year follow up.  BPH with LU TS His IPSS score today is 18, which is moderate lower urinary tract symptomatology.  He is mostly satisfied with his quality life due to his urinary symptoms.  His previous I PSS score was 5/1.  His previous PVR was 43 mL.  His main complaints today are frequency, urgency, nocturia and incontinence.  He is currently on tamsulosin 0.4 mg daily.  He denies any dysuria, hematuria or suprapubic pain.   He denies any recent fevers, chills, nausea or vomiting.  IPSS    Row Name 12/27/17 1100         International Prostate Symptom Score   How often have you had the sensation of not emptying your bladder?  About half the time     How often have you had to urinate less than every two hours?  About half the time     How often have you found you stopped and started again several times when you urinated?  About half the time     How often have you found it difficult to postpone urination?  About half the time     How often have you had a weak urinary stream?  About half the time     How often have you had to strain to start urination?  Not at All     How many times did you typically get up at night to urinate?  3 Times     Total IPSS Score  18       Quality of Life due to urinary symptoms   If you were to spend the rest of your life with your urinary condition just the way it is now how would you feel about that?  Mostly Satisfied        Score:  1-7 Mild 8-19 Moderate 20-35 Severe   History of sepsis with E.coli Admitted on 06/04/2016 to [facility].  No prior UTIs. Started on ceftriaxone on admit. UCx and E coli with similar resistance pattern, fairly  pan-S. Developed recurrent fevers after period of defervescence on 11/29 so broadened briefly to zosyn. Fever work-up largely unremarkable. Repeat blood cultures, urine culture negative. CXR negative for acute pulm process. CT a/p performed to evaluate for abscess and was negative but did show possible BPH nodule vs prostatitis. DRE exam performed after this finding and was negative for tenderness. PSA only mildly above range.     PMH: Past Medical History:  Diagnosis Date  . Angina pectoris ([place])   . 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 BPH (benign prostatic hypertrophy) with urinary obstruction   . CHF (congestive heart failure) ([place])   . Diabetes mellitus without complication ([place])   . HLD (hyperlipidemia)   . Hypertension   . Obesity   . Penile adhesions   . Sleep apnea   . SOB (shortness of breath)   . Stroke [facility])     Surgical History: Past Surgical History:  Procedure Laterality Date  . BACK SURGERY    . CARDIAC CATHETERIZATION    . COLONOSCOPY    . COLONOSCOPY WITH PROPOFOL N/A 09/22/2015   Procedure: COLONOSCOPY WITH PROPOFOL;  Surgeon: Lucilla Lame, MD;  Location: [facility] ENDOSCOPY;  Service: Endoscopy;  Laterality: N/A;  . CORONARY ARTERY BYPASS GRAFT  1996  . HEMORRHOID SURGERY  2007  . HEMORRHOID SURGERY  08-12-15   excision internal hemorrhoid Dr Jamal Collin  . JOINT REPLACEMENT Left 2004   Knee    Home Medications:  Allergies as of 12/27/2017      Reactions   Statins Other (See Comments)   Suspected cause of muscle pain      Medication List        Accurate as of 12/27/17 11:24 AM. Always use your most recent med list.          aspirin 81 MG tablet Take 81 mg by mouth daily.   clopidogrel 75 MG tablet Commonly known as:  PLAVIX Take 75 mg by mouth daily.   Fish Oil 1000 MG Caps Take 4 capsules by mouth 2 (two) times daily. 4 capsules in morning and one at night   glucosamine-chondroitin 500-400 MG tablet Take 1 tablet by mouth daily.     HUMALOG KWIKPEN 100 UNIT/ML KiwkPen Generic drug:  insulin lispro INJECT 10 UNITS INTO THE SKIN 3 TIMES A DAY   isosorbide mononitrate 30 MG 24 hr tablet Commonly known as:  IMDUR Take 30 mg by mouth daily.   KLOR-CON M20 20 MEQ tablet Generic drug:  potassium chloride SA Take 20 mEq by mouth 2 (two) times daily.   LANTUS SOLOSTAR 100 UNIT/ML Solostar Pen Generic drug:  Insulin Glargine INJECT 45 UNITS UNDER SKIN ONCE DAILY   LEVEMIR FLEXTOUCH 100 UNIT/ML Pen Generic drug:  Insulin Detemir Inject 35-40 Units into the skin at bedtime. Says 50 units is prescribed but will usually take 35-40 at night   lisinopril 2.5 MG tablet Commonly known as:  PRINIVIL,ZESTRIL Take 2.5 mg by mouth daily.   metFORMIN 1000 MG tablet Commonly known as:  GLUCOPHAGE 1,000 mg 2 (two) times daily with a meal.   metoprolol succinate 25 MG 24 hr tablet Commonly known as:  TOPROL-XL Take 12.5 mg by mouth daily.   nitroGLYCERIN 0.4 MG SL tablet Commonly known as:  NITROSTAT Place under the tongue.   ONETOUCH VERIO test strip Generic drug:  glucose blood 3 (three) times daily. for testing   pravastatin 40 MG tablet Commonly known as:  PRAVACHOL Take 40 mg by mouth daily.   SIMBRINZA 1-0.2 % Susp Generic drug:  Brinzolamide-Brimonidine Apply 1 drop to eye 2 (two) times daily.   tamsulosin 0.4 MG Caps capsule Commonly known as:  FLOMAX Take 1 capsule (0.4 mg total) by mouth daily.   torsemide 20 MG tablet Commonly known as:  DEMADEX Take 20-30 mg by mouth 2 (two) times daily. 30 mg in the morning and 20mg  in the evening   UNIFINE PENTIPS 31G X 8 MM Misc Generic drug:  Insulin Pen Needle as directed   vitamin B-12 500 MCG tablet Commonly known as:  CYANOCOBALAMIN Take 500 mcg by mouth daily.   vitamin C 500 MG tablet Commonly known as:  ASCORBIC ACID Take 500 mg by mouth daily.       Allergies:  Allergies  Allergen Reactions  . Statins Other (See Comments)    Suspected  cause of muscle pain    Family History: Family History  Problem Relation Age of Onset  . Diabetes Mother   . Alzheimer's disease Mother   . Cancer Sister   . Heart disease Unknown        Family members in general  .  Diabetes Brother   . Prostate cancer Neg Hx   . Bladder Cancer Neg Hx   . Kidney disease Neg Hx     Social History:  reports that he quit smoking about 36 years ago. He has never used smokeless tobacco. He reports that he drinks alcohol. He reports that he does not use drugs.  ROS: UROLOGY Frequent Urination?: Yes Hard to postpone urination?: Yes Burning/pain with urination?: No Get up at night to urinate?: Yes Leakage of urine?: Yes Urine stream starts and stops?: No Trouble starting stream?: No Do you have to strain to urinate?: No Blood in urine?: No Urinary tract infection?: No Sexually transmitted disease?: No Injury to kidneys or bladder?: No Painful intercourse?: No Weak stream?: No Erection problems?: No Penile pain?: No  Gastrointestinal Nausea?: No Vomiting?: No Indigestion/heartburn?: No Diarrhea?: No Constipation?: No  Constitutional Fever: No Night sweats?: No Weight loss?: No Fatigue?: No  Skin Skin rash/lesions?: No Itching?: No  Eyes Blurred vision?: No Double vision?: No  Ears/Nose/Throat Sore throat?: No Sinus problems?: No  Hematologic/Lymphatic Swollen glands?: No Easy bruising?: No  Cardiovascular Leg swelling?: No Chest pain?: No  Respiratory Cough?: No Shortness of breath?: No  Endocrine Excessive thirst?: No  Musculoskeletal Back pain?: No Joint pain?: Yes  Neurological Headaches?: No Dizziness?: Yes  Psychologic Depression?: No Anxiety?: No  Physical Exam: BP 110/65 (BP Location: Right Arm, Patient Position: Sitting, Cuff Size: Large)   Pulse 74   Ht 5\' 8"  (1.727 m)   Wt 225 lb 14.4 oz (102.5 kg)   BMI 34.35 kg/m   Constitutional: Well nourished. Alert and oriented, No acute  distress. HEENT: [place] AT, moist mucus membranes. Trachea midline, no masses. Cardiovascular: No clubbing, cyanosis, or edema. Respiratory: Normal respiratory effort, no increased work of breathing. GI: Abdomen is soft, non tender, non distended, no abdominal masses. Liver and spleen not palpable.  No hernias appreciated.  Stool sample for occult testing is not indicated.   GU: No CVA tenderness.  No bladder fullness or masses.  Patient with circumcised phallus.  Urethral meatus is patent.  No penile discharge. No penile lesions or rashes. Scrotum without lesions, cysts, rashes and/or edema.  Testicles are located scrotally bilaterally. No masses are appreciated in the testicles. Left and right epididymis are normal. Rectal: Patient with  normal sphincter tone. Anus and perineum without scarring or rashes. No rectal masses are appreciated. Prostate is approximately 50 grams, no nodules are appreciated on today's exam.  Seminal vesicles are normal. Skin: No rashes, bruises or suspicious lesions. Lymph: No cervical or inguinal adenopathy. Neurologic: Grossly intact, no focal deficits, moving all 4 extremities. Psychiatric: Normal mood and affect.   Laboratory Data: PSA History  2.0 ng/mL on 09/07/2015  7.34 ng/mL on 06/10/2016  2.9 ng/mL on 06/27/2016  2.0 ng/mL on 09/07/2015  1.9 ng/mL on 12/20/2016  2.1 ng/mL on 12/26/2017  I have reviewed the labs.  I have reviewed the labs.   Assessment & Plan:    1. BPH with LUTS IPSS score is 18/2, it is worsening Continue conservative management, avoiding bladder irritants and timed voiding's Most bothersome symptoms is/are frequency - does not want to pursue any other treatments at this time  Continue tamsulosin 0.4 mg daily:refills given RTC in 12 months for IPSS, PSA, PVR and exam   2. History of elevated PSA Most likely due to infection Patient and wife are still wanting to have yearly PSA's at this time  Return in about 1 year (around  12/28/2018)  for IPSS, PSA and exam.  These notes generated with voice recognition software. I apologize for typographical errors.  Zara Council, PA-C  [facility] [address] [place], [place] [address] [phone]

## 2017-12-25 ENCOUNTER — Other Ambulatory Visit: Payer: Medicare Other

## 2017-12-26 ENCOUNTER — Other Ambulatory Visit: Payer: Self-pay

## 2017-12-26 ENCOUNTER — Other Ambulatory Visit: Payer: Medicare Other

## 2017-12-26 DIAGNOSIS — R972 Elevated prostate specific antigen [PSA]: Secondary | ICD-10-CM

## 2017-12-26 DIAGNOSIS — Z87898 Personal history of other specified conditions: Secondary | ICD-10-CM

## 2017-12-27 ENCOUNTER — Ambulatory Visit (INDEPENDENT_AMBULATORY_CARE_PROVIDER_SITE_OTHER): Payer: Medicare Other | Admitting: Urology

## 2017-12-27 ENCOUNTER — Encounter: Payer: Self-pay | Admitting: Urology

## 2017-12-27 VITALS — BP 110/65 | HR 74 | Ht 68.0 in | Wt 225.9 lb

## 2017-12-27 DIAGNOSIS — N401 Enlarged prostate with lower urinary tract symptoms: Secondary | ICD-10-CM

## 2017-12-27 DIAGNOSIS — N138 Other obstructive and reflux uropathy: Secondary | ICD-10-CM | POA: Diagnosis not present

## 2017-12-27 DIAGNOSIS — Z87898 Personal history of other specified conditions: Secondary | ICD-10-CM | POA: Diagnosis not present

## 2017-12-27 LAB — PSA: PROSTATE SPECIFIC AG, SERUM: 2.1 ng/mL (ref 0.0–4.0)

## 2017-12-27 MED ORDER — TAMSULOSIN HCL 0.4 MG PO CAPS: 0.4000 mg | ORAL_CAPSULE | Freq: Every day | ORAL | 3 refills | Status: DC

## 2018-01-01 ENCOUNTER — Ambulatory Visit: Payer: Medicare Other | Admitting: Podiatry

## 2018-01-08 ENCOUNTER — Encounter: Payer: Self-pay | Admitting: Podiatry

## 2018-01-08 ENCOUNTER — Ambulatory Visit (INDEPENDENT_AMBULATORY_CARE_PROVIDER_SITE_OTHER): Payer: Medicare Other | Admitting: Podiatry

## 2018-01-08 DIAGNOSIS — D689 Coagulation defect, unspecified: Secondary | ICD-10-CM

## 2018-01-08 DIAGNOSIS — M79674 Pain in right toe(s): Secondary | ICD-10-CM | POA: Diagnosis not present

## 2018-01-08 DIAGNOSIS — B351 Tinea unguium: Secondary | ICD-10-CM | POA: Diagnosis not present

## 2018-01-08 DIAGNOSIS — E1149 Type 2 diabetes mellitus with other diabetic neurological complication: Secondary | ICD-10-CM

## 2018-01-08 DIAGNOSIS — M79675 Pain in left toe(s): Secondary | ICD-10-CM

## 2018-01-08 NOTE — Progress Notes (Signed)
Complaint:  Visit Type: Patient returns to my office for continued preventative foot care services. Complaint: Patient states" my nails have grown long and thick and become painful to walk and wear shoes" Patient has been diagnosed with DM with no foot complications. The patient presents for preventative foot care services. No changes to ROS.    Podiatric Exam: Vascular: dorsalis pedis and posterior tibial pulses are palpable bilateral. Capillary return is immediate. Temperature gradient is WNL. Skin turgor WNL  Sensorium: Diminished  Semmes Weinstein monofilament test. Normal tactile sensation bilaterally. Nail Exam: Pt has thick disfigured discolored nails with subungual debris noted bilateral entire nail hallux through fifth toenails Ulcer Exam: There is no evidence of ulcer or pre-ulcerative changes or infection. Orthopedic Exam: Muscle tone and strength are WNL. No limitations in general ROM. No crepitus or effusions noted. Foot type and digits show no abnormalities. HAV  B/L with hammer toes. Skin: No Porokeratosis. No infection or ulcers  Diagnosis:  Onychomycosis, , Pain in right toe, pain in left toes  Treatment & Plan Procedures and Treatment: Consent by patient was obtained for treatment procedures. The patient understood the discussion of treatment and procedures well. All questions were answered thoroughly reviewed. Debridement of mycotic and hypertrophic toenails, 1 through 5 bilateral and clearing of subungual debris. No ulceration, no infection noted.  Return Visit-Office Procedure: Patient instructed to return to the office for a follow up visit 10 weeks  for continued evaluation and treatment.    Gardiner Barefoot DPM

## 2018-03-19 ENCOUNTER — Ambulatory Visit: Payer: Medicare Other | Admitting: Podiatry

## 2018-03-26 ENCOUNTER — Encounter: Payer: Self-pay | Admitting: Podiatry

## 2018-03-26 ENCOUNTER — Ambulatory Visit (INDEPENDENT_AMBULATORY_CARE_PROVIDER_SITE_OTHER): Payer: Medicare Other | Admitting: Podiatry

## 2018-03-26 DIAGNOSIS — M79674 Pain in right toe(s): Secondary | ICD-10-CM

## 2018-03-26 DIAGNOSIS — D689 Coagulation defect, unspecified: Secondary | ICD-10-CM

## 2018-03-26 DIAGNOSIS — M79675 Pain in left toe(s): Secondary | ICD-10-CM

## 2018-03-26 DIAGNOSIS — E1149 Type 2 diabetes mellitus with other diabetic neurological complication: Secondary | ICD-10-CM

## 2018-03-26 DIAGNOSIS — B351 Tinea unguium: Secondary | ICD-10-CM

## 2018-03-26 NOTE — Progress Notes (Signed)
Complaint:  Visit Type: Patient returns to my office for continued preventative foot care services. Complaint: Patient states" my nails have grown long and thick and become painful to walk and wear shoes" Patient has been diagnosed with DM with no foot complications. The patient presents for preventative foot care services. No changes to ROS.    Podiatric Exam: Vascular: dorsalis pedis and posterior tibial pulses are palpable bilateral. Capillary return is immediate. Temperature gradient is WNL. Skin turgor WNL  Sensorium: Diminished  Semmes Weinstein monofilament test. Normal tactile sensation bilaterally. Nail Exam: Pt has thick disfigured discolored nails with subungual debris noted bilateral entire nail hallux through fifth toenails Ulcer Exam: There is no evidence of ulcer or pre-ulcerative changes or infection. Orthopedic Exam: Muscle tone and strength are WNL. No limitations in general ROM. No crepitus or effusions noted. Foot type and digits show no abnormalities. HAV  B/L with hammer toes. Skin: No Porokeratosis. No infection or ulcers  Diagnosis:  Onychomycosis, , Pain in right toe, pain in left toes  Treatment & Plan Procedures and Treatment: Consent by patient was obtained for treatment procedures. The patient understood the discussion of treatment and procedures well. All questions were answered thoroughly reviewed. Debridement of mycotic and hypertrophic toenails, 1 through 5 bilateral and clearing of subungual debris. No ulceration, no infection noted.  Return Visit-Office Procedure: Patient instructed to return to the office for a follow up visit 12 weeks  for continued evaluation and treatment.    Gardiner Barefoot DPM

## 2018-05-25 ENCOUNTER — Other Ambulatory Visit: Payer: Self-pay | Admitting: *Deleted

## 2018-05-28 ENCOUNTER — Encounter: Payer: Self-pay | Admitting: Podiatry

## 2018-05-28 ENCOUNTER — Ambulatory Visit (INDEPENDENT_AMBULATORY_CARE_PROVIDER_SITE_OTHER): Payer: Medicare Other | Admitting: Podiatry

## 2018-05-28 DIAGNOSIS — D689 Coagulation defect, unspecified: Secondary | ICD-10-CM

## 2018-05-28 DIAGNOSIS — M79675 Pain in left toe(s): Secondary | ICD-10-CM | POA: Diagnosis not present

## 2018-05-28 DIAGNOSIS — M79674 Pain in right toe(s): Secondary | ICD-10-CM

## 2018-05-28 DIAGNOSIS — B351 Tinea unguium: Secondary | ICD-10-CM | POA: Diagnosis not present

## 2018-05-28 DIAGNOSIS — E1149 Type 2 diabetes mellitus with other diabetic neurological complication: Secondary | ICD-10-CM

## 2018-05-28 NOTE — Progress Notes (Signed)
Complaint:  Visit Type: Patient returns to my office for continued preventative foot care services. Complaint: Patient states" my nails have grown long and thick and become painful to walk and wear shoes" Patient has been diagnosed with DM with no foot complications. The patient presents for preventative foot care services. No changes to ROS.  Patient is taking plavix.  Podiatric Exam: Vascular: dorsalis pedis and posterior tibial pulses are palpable bilateral. Capillary return is immediate. Temperature gradient is WNL. Skin turgor WNL  Sensorium: Diminished  Semmes Weinstein monofilament test. Normal tactile sensation bilaterally. Nail Exam: Pt has thick disfigured discolored nails with subungual debris noted bilateral entire nail hallux through fifth toenails Ulcer Exam: There is no evidence of ulcer or pre-ulcerative changes or infection. Orthopedic Exam: Muscle tone and strength are WNL. No limitations in general ROM. No crepitus or effusions noted. Foot type and digits show no abnormalities. HAV  B/L with hammer toes. Skin: No Porokeratosis. No infection or ulcers  Diagnosis:  Onychomycosis, , Pain in right toe, pain in left toes  Treatment & Plan Procedures and Treatment: Consent by patient was obtained for treatment procedures. The patient understood the discussion of treatment and procedures well. All questions were answered thoroughly reviewed. Debridement of mycotic and hypertrophic toenails, 1 through 5 bilateral and clearing of subungual debris. No ulceration, no infection noted.  Return Visit-Office Procedure: Patient instructed to return to the office for a follow up visit 10 weeks  for continued evaluation and treatment.    Gardiner Barefoot DPM

## 2018-05-29 IMAGING — CR DG FEMUR 2+V*L*
4 series · 4 of 4 positions shown · non-contrast
Comparison: None.

CLINICAL DATA: Left leg pain after injury. Swelling to the left
upper thigh. History of diabetes.

EXAM:
LEFT FEMUR 2 VIEWS

[femur ap (1 of 2)]
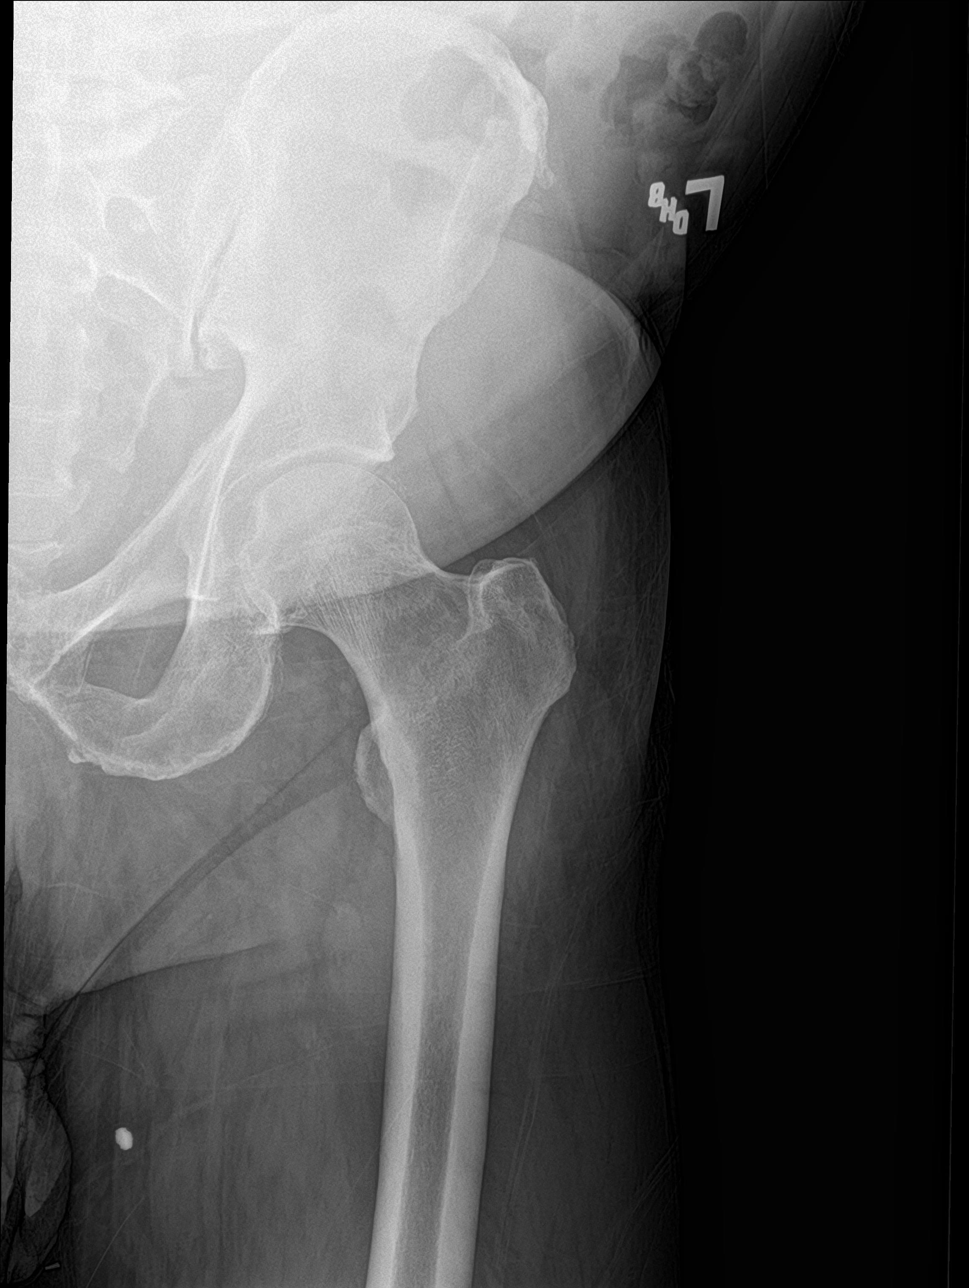

[femur ap (2 of 2)]
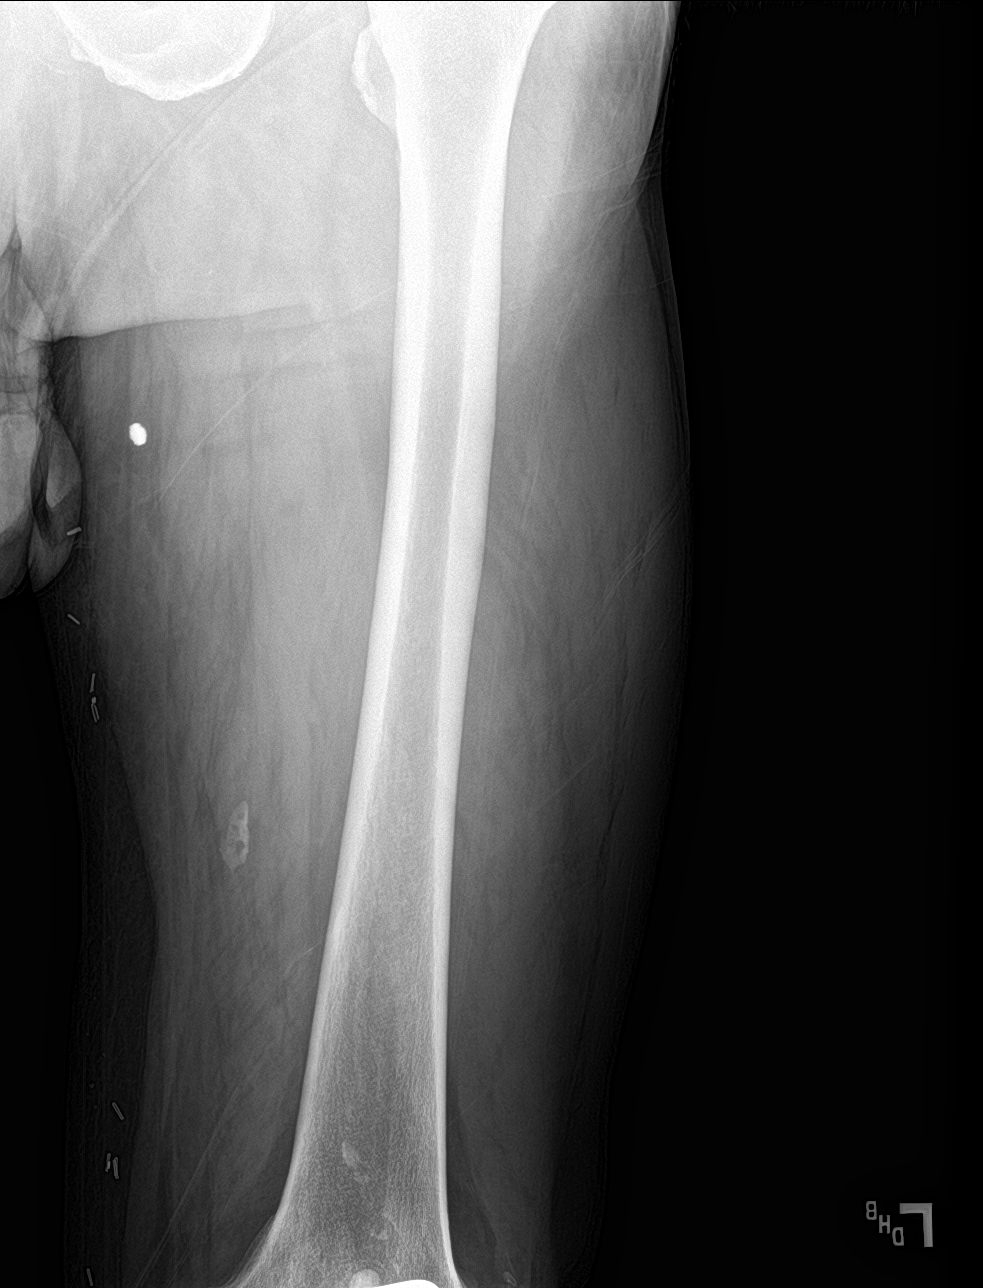

[femur lat (1 of 2)]
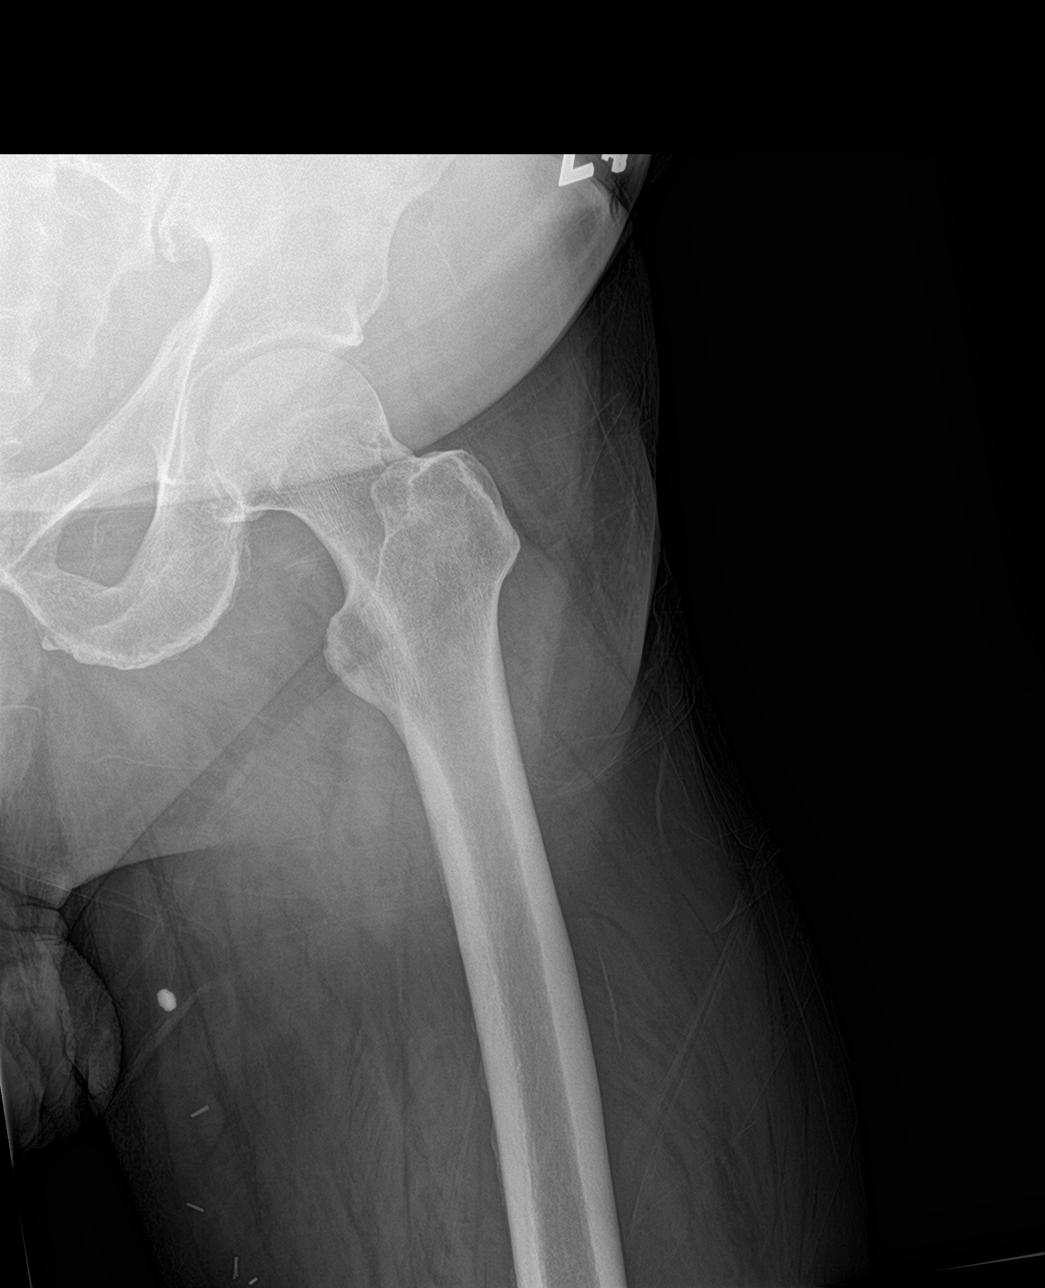

[femur lat (2 of 2)]
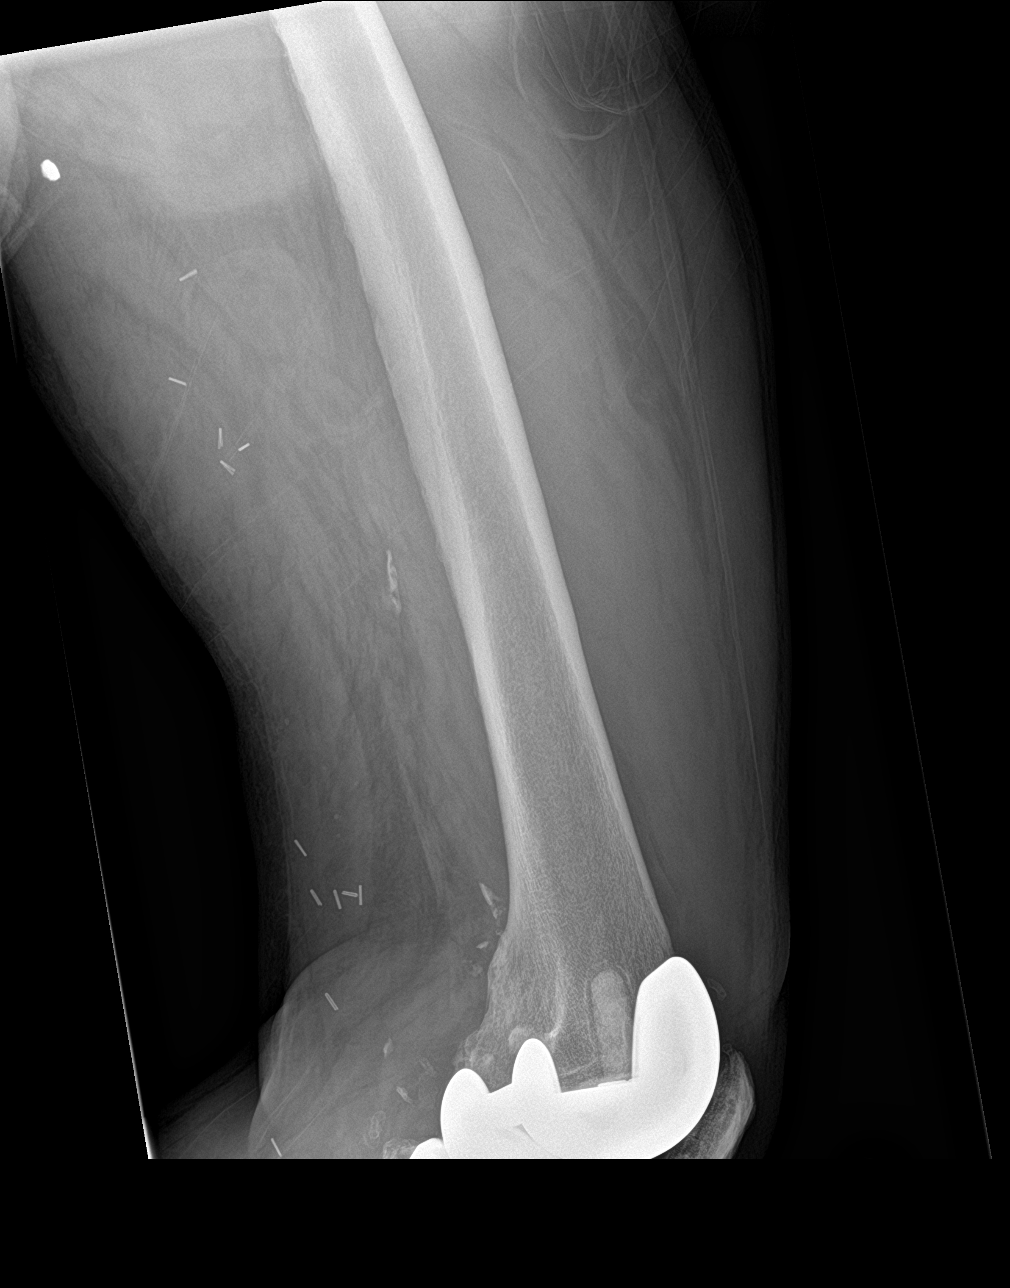

[4 of 4 positions shown; findings below may reference images not displayed]

FINDINGS: Left hip and left femur appear intact. No evidence of acute fracture
or dislocation. The left total knee arthroplasty is demonstrated
with incomplete visualization. Vascular calcifications and surgical
clips in the soft tissues.
IMPRESSION: No acute bony abnormalities.

## 2018-08-09 ENCOUNTER — Encounter: Payer: Self-pay | Admitting: Podiatry

## 2018-08-09 ENCOUNTER — Ambulatory Visit (INDEPENDENT_AMBULATORY_CARE_PROVIDER_SITE_OTHER): Payer: Medicare Other | Admitting: Podiatry

## 2018-08-09 DIAGNOSIS — E1149 Type 2 diabetes mellitus with other diabetic neurological complication: Secondary | ICD-10-CM | POA: Diagnosis not present

## 2018-08-09 DIAGNOSIS — M79674 Pain in right toe(s): Secondary | ICD-10-CM | POA: Diagnosis not present

## 2018-08-09 DIAGNOSIS — B351 Tinea unguium: Secondary | ICD-10-CM

## 2018-08-09 DIAGNOSIS — M79675 Pain in left toe(s): Secondary | ICD-10-CM | POA: Diagnosis not present

## 2018-08-09 DIAGNOSIS — D689 Coagulation defect, unspecified: Secondary | ICD-10-CM

## 2018-08-09 NOTE — Progress Notes (Signed)
Complaint:  Visit Type: Patient returns to my office for continued preventative foot care services. Complaint: Patient states" my nails have grown long and thick and become painful to walk and wear shoes" Patient has been diagnosed with DM with no foot complications. The patient presents for preventative foot care services. No changes to ROS.  Patient is taking plavix.  Podiatric Exam: Vascular: dorsalis pedis and posterior tibial pulses are palpable bilateral. Capillary return is immediate. Temperature gradient is WNL. Skin turgor WNL  Sensorium: Diminished  Semmes Weinstein monofilament test. Normal tactile sensation bilaterally. Nail Exam: Pt has thick disfigured discolored nails with subungual debris noted bilateral entire nail hallux through fifth toenails Ulcer Exam: There is no evidence of ulcer or pre-ulcerative changes or infection. Orthopedic Exam: Muscle tone and strength are WNL. No limitations in general ROM. No crepitus or effusions noted. Foot type and digits show no abnormalities. HAV  B/L with hammer toes. Skin: No Porokeratosis. No infection or ulcers  Diagnosis:  Onychomycosis, , Pain in right toe, pain in left toes  Treatment & Plan Procedures and Treatment: Consent by patient was obtained for treatment procedures. The patient understood the discussion of treatment and procedures well. All questions were answered thoroughly reviewed. Debridement of mycotic and hypertrophic toenails, 1 through 5 bilateral and clearing of subungual debris. No ulceration, no infection noted.  Return Visit-Office Procedure: Patient instructed to return to the office for a follow up visit 10 weeks  for continued evaluation and treatment.    Gardiner Barefoot DPM

## 2018-10-18 ENCOUNTER — Ambulatory Visit (INDEPENDENT_AMBULATORY_CARE_PROVIDER_SITE_OTHER): Payer: Medicare Other | Admitting: Podiatry

## 2018-10-18 ENCOUNTER — Encounter: Payer: Self-pay | Admitting: Podiatry

## 2018-10-18 ENCOUNTER — Other Ambulatory Visit: Payer: Self-pay

## 2018-10-18 VITALS — Temp 98.0°F

## 2018-10-18 DIAGNOSIS — D689 Coagulation defect, unspecified: Secondary | ICD-10-CM

## 2018-10-18 DIAGNOSIS — M79674 Pain in right toe(s): Secondary | ICD-10-CM | POA: Diagnosis not present

## 2018-10-18 DIAGNOSIS — E1149 Type 2 diabetes mellitus with other diabetic neurological complication: Secondary | ICD-10-CM

## 2018-10-18 DIAGNOSIS — M2010 Hallux valgus (acquired), unspecified foot: Secondary | ICD-10-CM

## 2018-10-18 DIAGNOSIS — M79675 Pain in left toe(s): Secondary | ICD-10-CM | POA: Diagnosis not present

## 2018-10-18 DIAGNOSIS — B351 Tinea unguium: Secondary | ICD-10-CM | POA: Diagnosis not present

## 2018-10-18 DIAGNOSIS — M2041 Other hammer toe(s) (acquired), right foot: Secondary | ICD-10-CM

## 2018-10-18 DIAGNOSIS — M2042 Other hammer toe(s) (acquired), left foot: Secondary | ICD-10-CM

## 2018-10-18 NOTE — Progress Notes (Signed)
Complaint:  Visit Type: Patient returns to my office for continued preventative foot care services. Complaint: Patient states" my nails have grown long and thick and become painful to walk and wear shoes" Patient has been diagnosed with DM with no foot complications. The patient presents for preventative foot care services. No changes to ROS.  Patient says he desires new diabetic shoes.  Podiatric Exam: Vascular: dorsalis pedis and posterior tibial pulses are palpable bilateral. Capillary return is immediate. Temperature gradient is WNL. Skin turgor WNL  Sensorium: Diminished  Semmes Weinstein monofilament test. Normal tactile sensation bilaterally. Nail Exam: Pt has thick disfigured discolored nails with subungual debris noted bilateral entire nail hallux through fifth toenails Ulcer Exam: There is no evidence of ulcer or pre-ulcerative changes or infection. Orthopedic Exam: Muscle tone and strength are WNL. No limitations in general ROM. No crepitus or effusions noted. Foot type and digits show no abnormalities. HAV  B/L with hammer toes. Skin: No Porokeratosis. No infection or ulcers  Diagnosis:  Onychomycosis, , Pain in right toe, pain in left toes  Treatment & Plan Procedures and Treatment: Consent by patient was obtained for treatment procedures. The patient understood the discussion of treatment and procedures well. All questions were answered thoroughly reviewed. Debridement of mycotic and hypertrophic toenails, 1 through 5 bilateral and clearing of subungual debris. No ulceration, no infection noted.  Measured for  diabetic shoes for DPN, HAV and hammer toes  B/L. Return Visit-Office Procedure: Patient instructed to return to the office for a follow up visit 3 months for continued evaluation and treatment.    Gardiner Barefoot DPM

## 2018-11-26 ENCOUNTER — Telehealth: Payer: Self-pay | Admitting: Podiatry

## 2018-11-27 NOTE — Telephone Encounter (Signed)
I spoke with patient and informed him that Dr. Rosario Jacks never sent paperwork back to [facility].  I informed him that he can come pick up paperwork and take it to his office and drop off for doctor to sign.  He agreed and will pick up paperwork at front desk.

## 2018-11-27 NOTE — Telephone Encounter (Signed)
Pt left message yesterday asking about status of diabetic shoes. On the message he stated he spoke to someone several weeks ago and was busy but told the person to call Dr Rosario Jacks to get it worked out.  I did refax the paperwork to them yesterday. Could you contact pt and get it worked out please.

## 2018-12-04 DIAGNOSIS — H539 Unspecified visual disturbance: Secondary | ICD-10-CM | POA: Insufficient documentation

## 2018-12-04 DIAGNOSIS — H401133 Primary open-angle glaucoma, bilateral, severe stage: Secondary | ICD-10-CM | POA: Insufficient documentation

## 2018-12-04 DIAGNOSIS — Z961 Presence of intraocular lens: Secondary | ICD-10-CM | POA: Insufficient documentation

## 2018-12-18 ENCOUNTER — Telehealth: Payer: Self-pay | Admitting: Podiatry

## 2018-12-18 ENCOUNTER — Other Ambulatory Visit: Payer: Self-pay | Admitting: Family Medicine

## 2018-12-18 DIAGNOSIS — N401 Enlarged prostate with lower urinary tract symptoms: Secondary | ICD-10-CM

## 2018-12-18 DIAGNOSIS — N138 Other obstructive and reflux uropathy: Secondary | ICD-10-CM

## 2018-12-18 NOTE — Telephone Encounter (Signed)
Pt called asking about status of his diabetic shoes. He is a [place] pt and I checked safestep and we were missing the last page of his paperwork. Pt was upset that no one has followed up on it.   I called Dr Guerry Bruin office and spoke to [employer] and she said it was filed out and faxed but she is going to fax directly to me. I notified pt and explained one we got the paperwork it takes about 2 wks to get them shoes and inserts in and someone from [place] should call him to schedule an appt to pick them up.

## 2018-12-19 ENCOUNTER — Other Ambulatory Visit: Payer: Medicare Other

## 2018-12-20 ENCOUNTER — Other Ambulatory Visit: Payer: Self-pay

## 2018-12-20 ENCOUNTER — Other Ambulatory Visit: Payer: Medicare Other

## 2018-12-20 DIAGNOSIS — N138 Other obstructive and reflux uropathy: Secondary | ICD-10-CM

## 2018-12-20 DIAGNOSIS — N401 Enlarged prostate with lower urinary tract symptoms: Secondary | ICD-10-CM

## 2018-12-21 LAB — PSA: Prostate Specific Ag, Serum: 2 ng/mL (ref 0.0–4.0)

## 2018-12-24 ENCOUNTER — Other Ambulatory Visit: Payer: Medicare Other

## 2018-12-24 DIAGNOSIS — Z7901 Long term (current) use of anticoagulants: Secondary | ICD-10-CM | POA: Insufficient documentation

## 2018-12-24 DIAGNOSIS — I480 Paroxysmal atrial fibrillation: Secondary | ICD-10-CM | POA: Insufficient documentation

## 2018-12-26 ENCOUNTER — Ambulatory Visit: Payer: Medicare Other | Admitting: Urology

## 2018-12-31 ENCOUNTER — Ambulatory Visit: Payer: Medicare Other | Admitting: Podiatry

## 2019-01-06 NOTE — Progress Notes (Signed)
01/07/2019 10:28 AM   [name] Ferguson [birth year] [id number]  Referring provider: Cletis Athens, MD [address] [place],  [place] [address]  Chief Complaint  Patient presents with  . Benign Prostatic Hypertrophy    follow up    HPI: 78 yo WM with BPH with LU TS, prostate nodules and a history of sepsis presents for a one year follow up.  BPH WITH LUTS  (prostate and/or bladder) IPSS score: 5/2    PVR: 15 mL   Previous score: 18/2   Previous PVR: 43 mL  Major complaint(s):  Frequency and urgency x several months.   Denies any dysuria, hematuria or suprapubic pain.   Currently taking: tamsulosin 0.4 mg daily   Denies any recent fevers, chills, nausea or vomiting.  IPSS    Row Name 01/07/19 0900         International Prostate Symptom Score   How often have you had the sensation of not emptying your bladder?  Not at All     How often have you had to urinate less than every two hours?  About half the time     How often have you found you stopped and started again several times when you urinated?  Not at All     How often have you found it difficult to postpone urination?  Not at All     How often have you had a weak urinary stream?  Less than half the time     How often have you had to strain to start urination?  Not at All     How many times did you typically get up at night to urinate?  None     Total IPSS Score  5       Quality of Life due to urinary symptoms   If you were to spend the rest of your life with your urinary condition just the way it is now how would you feel about that?  Mostly Satisfied        Score:  1-7 Mild 8-19 Moderate 20-35 Severe   PMH: Past Medical History:  Diagnosis Date  . Angina pectoris ([place])   . 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 BPH (benign prostatic hypertrophy) with urinary obstruction   . CHF (congestive heart failure) ([place])   . Diabetes mellitus without complication ([place])   . HLD (hyperlipidemia)   .  Hypertension   . Obesity   . Penile adhesions   . Sleep apnea   . SOB (shortness of breath)   . Stroke [facility])     Surgical History: Past Surgical History:  Procedure Laterality Date  . BACK SURGERY    . CARDIAC CATHETERIZATION    . COLONOSCOPY    . COLONOSCOPY WITH PROPOFOL N/A 09/22/2015   Procedure: COLONOSCOPY WITH PROPOFOL;  Surgeon: Lucilla Lame, MD;  Location: [facility] ENDOSCOPY;  Service: Endoscopy;  Laterality: N/A;  . CORONARY ARTERY BYPASS GRAFT  1996  . HEMORRHOID SURGERY  2007  . HEMORRHOID SURGERY  08-12-15   excision internal hemorrhoid Dr Jamal Collin  . JOINT REPLACEMENT Left 2004   Knee    Home Medications:  Allergies as of 01/07/2019      Reactions   Statins Other (See Comments)   Suspected cause of muscle pain      Medication List       Accurate as of January 07, 2019 10:28 AM. If you have any questions, ask your nurse or doctor.  amoxicillin 500 MG capsule Commonly known as: AMOXIL Before dental procedures   aspirin 81 MG tablet Take 81 mg by mouth daily.   Besivance 0.6 % Susp Generic drug: Besifloxacin HCl Apply to eye.   clopidogrel 75 MG tablet Commonly known as: PLAVIX 1 tab by mouth daily   dorzolamide 2 % ophthalmic solution Commonly known as: TRUSOPT Administer 2 drops to both eyes Two (2) times a day.   Eliquis 5 MG Tabs tablet Generic drug: apixaban   Fish Oil 1000 MG Caps Take 4 capsules by mouth 2 (two) times daily. 4 capsules in morning and one at night   OMEGA-3 FISH OIL PO Take by mouth.   Fluad 0.5 ML Susy Generic drug: Influenza Vac A&B Surf Ant Adj TO BE ADMINISTERED BY PHARMACIST FOR IMMUNIZATION   fluticasone 50 MCG/ACT nasal spray Commonly known as: FLONASE U 1 SPR NASALLY BID   glucosamine-chondroitin 500-400 MG tablet Take 1 tablet by mouth daily.   insulin lispro 100 UNIT/ML KiwkPen Commonly known as: HUMALOG 3 (three) times daily with meals. Sliding Scale.   isosorbide mononitrate 30 MG 24 hr tablet  Commonly known as: IMDUR Take 30 mg by mouth daily.   Klor-Con M20 20 MEQ tablet Generic drug: potassium chloride SA Take 20 mEq by mouth 2 (two) times daily.   Lantus SoloStar 100 UNIT/ML Solostar Pen Generic drug: Insulin Glargine INJECT 45 UNITS UNDER SKIN ONCE DAILY   latanoprost 0.005 % ophthalmic solution Commonly known as: XALATAN   Levemir FlexTouch 100 UNIT/ML Pen Generic drug: Insulin Detemir Inject 35-40 Units into the skin at bedtime. Says 50 units is prescribed but will usually take 35-40 at night   lisinopril 2.5 MG tablet Commonly known as: ZESTRIL Take 2.5 mg by mouth daily.   metFORMIN 1000 MG tablet Commonly known as: GLUCOPHAGE 1,000 mg 2 (two) times daily with a meal.   metoprolol succinate 25 MG 24 hr tablet Commonly known as: TOPROL-XL TAKE 1/2 TABLET BY MOUTH EVERY DAY   MULTIVITAMIN ADULT PO Take by mouth.   nitroGLYCERIN 0.4 MG SL tablet Commonly known as: NITROSTAT PLACE 1 TABLET UNDER THE TONGUE EVERY 5 MINUTES AS NEEDED   OneTouch Delica Lancets 82U Misc USE 1 LANCET 3 TIMES A DAY   OneTouch Verio IQ System w/Device Kit U UTD TO CHECK BS QID   OneTouch Verio test strip Generic drug: glucose blood TEST 3 TIMES DAILY   PEN NEEDLES 31GX5/16" 31G X 8 MM Misc as directed   pravastatin 40 MG tablet Commonly known as: PRAVACHOL TAKE 1 TABLET BY MOUTH EVERY EVENING   Simbrinza 1-0.2 % Susp Generic drug: Brinzolamide-Brimonidine Apply 1 drop to eye 2 (two) times daily.   tamsulosin 0.4 MG Caps capsule Commonly known as: FLOMAX Take 1 capsule (0.4 mg total) by mouth daily.   torsemide 20 MG tablet Commonly known as: DEMADEX TAKE 2 TABLETS BY MOUTH TWICE A DAY FOR 1 WEEK, THEN 2 IN THE MORNING AND 1 IN THE EVENING   Tradjenta 5 MG Tabs tablet Generic drug: linagliptin   triamcinolone cream 0.1 % Commonly known as: KENALOG Apply topically.   UNABLE TO FIND Take by mouth.   vitamin B-12 500 MCG tablet Commonly known as:  CYANOCOBALAMIN Take 500 mcg by mouth daily.   vitamin C 500 MG tablet Commonly known as: ASCORBIC ACID Take 500 mg by mouth daily.       Allergies:  Allergies  Allergen Reactions  . Statins Other (See Comments)    Suspected cause  of muscle pain    Family History: Family History  Problem Relation Age of Onset  . Diabetes Mother   . Alzheimer's disease Mother   . Cancer Sister   . Heart disease Unknown        Family members in general  . Diabetes Brother   . Prostate cancer Neg Hx   . Bladder Cancer Neg Hx   . Kidney disease Neg Hx     Social History:  reports that he quit smoking about 37 years ago. He has never used smokeless tobacco. He reports current alcohol use. He reports that he does not use drugs.  ROS: UROLOGY Frequent Urination?: No Hard to postpone urination?: No Burning/pain with urination?: No Get up at night to urinate?: No Leakage of urine?: No Urine stream starts and stops?: No Trouble starting stream?: No Do you have to strain to urinate?: No Blood in urine?: No Urinary tract infection?: No Sexually transmitted disease?: No Injury to kidneys or bladder?: No Painful intercourse?: No Weak stream?: No Erection problems?: No Penile pain?: No  Gastrointestinal Nausea?: No Vomiting?: No Indigestion/heartburn?: No Diarrhea?: No Constipation?: No  Constitutional Fever: No Night sweats?: No Weight loss?: No Fatigue?: No  Skin Skin rash/lesions?: No Itching?: No  Eyes Blurred vision?: No Double vision?: No  Ears/Nose/Throat Sore throat?: No Sinus problems?: No  Hematologic/Lymphatic Swollen glands?: No Easy bruising?: No  Cardiovascular Leg swelling?: Yes Chest pain?: No  Respiratory Cough?: No Shortness of breath?: Yes  Endocrine Excessive thirst?: No  Musculoskeletal Back pain?: No Joint pain?: Yes  Neurological Headaches?: No Dizziness?: Yes  Psychologic Depression?: No Anxiety?: No  Physical Exam: BP  132/64   Pulse 89   Ht '5\' 8"'  (1.727 m)   Wt 225 lb (102.1 kg)   BMI 34.21 kg/m   Constitutional:  Well nourished. Alert and oriented, No acute distress. HEENT: [place] AT, moist mucus membranes.  Trachea midline, no masses. Cardiovascular: No clubbing, cyanosis, or edema. Respiratory: Normal respiratory effort, no increased work of breathing. GI: Abdomen is soft, non tender, non distended, no abdominal masses. Liver and spleen not palpable.  No hernias appreciated.  Stool sample for occult testing is not indicated.   GU: No CVA tenderness.  No bladder fullness or masses.  Patient with uncircumcised phallus. Foreskin easily retracted  Urethral meatus is patent.  No penile discharge. No penile lesions or rashes. Scrotum without lesions, cysts, rashes and/or edema.  Testicles are located scrotally bilaterally. No masses are appreciated in the testicles. Left and right epididymis are normal. Rectal: Patient with  normal sphincter tone. Anus and perineum without scarring or rashes. No rectal masses are appreciated. Prostate is approximately 50 grams, no nodules are appreciated.   Skin: No rashes, bruises or suspicious lesions. Lymph: No inguinal adenopathy. Neurologic: Grossly intact, no focal deficits, moving all 4 extremities. Psychiatric: Normal mood and affect.  Laboratory Data: PSA History  2.0 ng/mL on 09/07/2015  7.34 ng/mL on 06/10/2016  2.9 ng/mL on 06/27/2016  2.0 ng/mL on 09/07/2015  1.9 ng/mL on 12/20/2016  2.1 ng/mL on 12/26/2017  2.0 ng/mL on 12/20/2018  I have reviewed the labs.  I have reviewed the labs.  Pertinent imaging Results for MENASHE, KAFER (MRN [id number]) as of 01/07/2019 10:43  Ref. Range 01/07/2019 10:28  Scan Result Unknown 15     Assessment & Plan:    1. BPH with LUTS IPSS score is 5/2, it is improved Continue conservative management, avoiding bladder irritants and timed voiding's Most bothersome symptoms is/are  frequency - does not want to pursue any  other treatments at this time  Continue tamsulosin 0.4 mg daily:refills given RTC in 12 months for IPSS, PSA, PVR and exam   2. History of elevated PSA Most likely due to infection - PSA stabilized  Patient and wife are still wanting to have yearly PSA's at this time  Return in about 1 year (around 01/07/2020) for IPSS, PSA and exam.  These notes generated with voice recognition software. I apologize for typographical errors.  Zara Council, PA-C  [facility] [address] [place], [place] [address] [phone]

## 2019-01-07 ENCOUNTER — Encounter: Payer: Self-pay | Admitting: Urology

## 2019-01-07 ENCOUNTER — Ambulatory Visit: Payer: Medicare Other | Admitting: Podiatry

## 2019-01-07 ENCOUNTER — Ambulatory Visit (INDEPENDENT_AMBULATORY_CARE_PROVIDER_SITE_OTHER): Payer: Medicare Other | Admitting: Urology

## 2019-01-07 ENCOUNTER — Other Ambulatory Visit: Payer: Self-pay

## 2019-01-07 VITALS — BP 132/64 | HR 89 | Ht 68.0 in | Wt 225.0 lb

## 2019-01-07 DIAGNOSIS — N138 Other obstructive and reflux uropathy: Secondary | ICD-10-CM | POA: Diagnosis not present

## 2019-01-07 DIAGNOSIS — Z87898 Personal history of other specified conditions: Secondary | ICD-10-CM | POA: Diagnosis not present

## 2019-01-07 DIAGNOSIS — N401 Enlarged prostate with lower urinary tract symptoms: Secondary | ICD-10-CM | POA: Diagnosis not present

## 2019-01-07 LAB — BLADDER SCAN AMB NON-IMAGING: Scan Result: 15

## 2019-01-07 MED ORDER — TAMSULOSIN HCL 0.4 MG PO CAPS: 0.4000 mg | ORAL_CAPSULE | Freq: Every day | ORAL | 3 refills | Status: DC

## 2019-01-10 ENCOUNTER — Ambulatory Visit: Payer: Medicare Other | Admitting: Podiatry

## 2019-01-17 ENCOUNTER — Other Ambulatory Visit: Payer: Self-pay

## 2019-01-17 ENCOUNTER — Ambulatory Visit (INDEPENDENT_AMBULATORY_CARE_PROVIDER_SITE_OTHER): Payer: Medicare Other | Admitting: Podiatry

## 2019-01-17 ENCOUNTER — Encounter: Payer: Self-pay | Admitting: Podiatry

## 2019-01-17 DIAGNOSIS — B351 Tinea unguium: Secondary | ICD-10-CM | POA: Insufficient documentation

## 2019-01-17 DIAGNOSIS — D689 Coagulation defect, unspecified: Secondary | ICD-10-CM

## 2019-01-17 DIAGNOSIS — M2010 Hallux valgus (acquired), unspecified foot: Secondary | ICD-10-CM

## 2019-01-17 DIAGNOSIS — M2011 Hallux valgus (acquired), right foot: Secondary | ICD-10-CM | POA: Diagnosis not present

## 2019-01-17 DIAGNOSIS — M79674 Pain in right toe(s): Secondary | ICD-10-CM

## 2019-01-17 DIAGNOSIS — E1142 Type 2 diabetes mellitus with diabetic polyneuropathy: Secondary | ICD-10-CM | POA: Diagnosis not present

## 2019-01-17 DIAGNOSIS — E1140 Type 2 diabetes mellitus with diabetic neuropathy, unspecified: Secondary | ICD-10-CM | POA: Insufficient documentation

## 2019-01-17 DIAGNOSIS — M2041 Other hammer toe(s) (acquired), right foot: Secondary | ICD-10-CM | POA: Diagnosis not present

## 2019-01-17 DIAGNOSIS — M2042 Other hammer toe(s) (acquired), left foot: Secondary | ICD-10-CM

## 2019-01-17 DIAGNOSIS — M79675 Pain in left toe(s): Secondary | ICD-10-CM

## 2019-01-17 NOTE — Progress Notes (Signed)
Complaint:  Visit Type: Patient returns to my office for continued preventative foot care services. Complaint: Patient states" my nails have grown long and thick and become painful to walk and wear shoes" Patient has been diagnosed with DM with no foot complications. The patient presents for preventative foot care services. No changes to ROS.  Patient is taking plavix.  He also is here to pick up his new diabetic shoes.  Podiatric Exam: Vascular: dorsalis pedis and posterior tibial pulses are palpable bilateral. Capillary return is immediate. Temperature gradient is WNL. Skin turgor WNL  Sensorium: Diminished  Semmes Weinstein monofilament test. Normal tactile sensation bilaterally. Nail Exam: Pt has thick disfigured discolored nails with subungual debris noted bilateral entire nail hallux through fifth toenails Ulcer Exam: There is no evidence of ulcer or pre-ulcerative changes or infection. Orthopedic Exam: Muscle tone and strength are WNL. No limitations in general ROM. No crepitus or effusions noted. Foot type and digits show no abnormalities. HAV  B/L with hammer toes. Skin: No Porokeratosis. No infection or ulcers  Diagnosis:  Onychomycosis, , Pain in right toe, pain in left toes  Treatment & Plan Procedures and Treatment: Consent by patient was obtained for treatment procedures. The patient understood the discussion of treatment and procedures well. All questions were answered thoroughly reviewed. Debridement of mycotic and hypertrophic toenails, 1 through 5 bilateral and clearing of subungual debris. No ulceration, no infection noted.  Dispense diabetic shoes.  Patient presents today and was dispensed 0ne pair ( two units) of medically necessary extra depth shoes with three pair( six units) of custom molded multiple density inserts. The shoes and the inserts are fitted to the patients ' feet and are noted to fit well and are free of defect.  Length and width of the shoes are also acceptable.   Patient was given written and verbal  instructions for wearing.  If any concerns arrive with the shoes or inserts, the patient is to call the office.Patient is to follow up with doctor in six weeks. Return Visit-Office Procedure: Patient instructed to return to the office for a follow up visit 3 months for continued evaluation and treatment.    Gardiner Barefoot DPM

## 2019-03-01 ENCOUNTER — Other Ambulatory Visit: Payer: Self-pay

## 2019-03-01 DIAGNOSIS — Z20822 Contact with and (suspected) exposure to covid-19: Secondary | ICD-10-CM

## 2019-03-02 LAB — NOVEL CORONAVIRUS, NAA: SARS-CoV-2, NAA: NOT DETECTED

## 2019-03-18 DIAGNOSIS — I5189 Other ill-defined heart diseases: Secondary | ICD-10-CM | POA: Insufficient documentation

## 2019-03-18 DIAGNOSIS — I519 Heart disease, unspecified: Secondary | ICD-10-CM | POA: Insufficient documentation

## 2019-03-18 DIAGNOSIS — I469 Cardiac arrest, cause unspecified: Secondary | ICD-10-CM | POA: Insufficient documentation

## 2019-03-28 ENCOUNTER — Encounter: Payer: Self-pay | Admitting: Podiatry

## 2019-03-28 ENCOUNTER — Ambulatory Visit (INDEPENDENT_AMBULATORY_CARE_PROVIDER_SITE_OTHER): Payer: Medicare Other | Admitting: Podiatry

## 2019-03-28 ENCOUNTER — Other Ambulatory Visit: Payer: Self-pay

## 2019-03-28 DIAGNOSIS — M79675 Pain in left toe(s): Secondary | ICD-10-CM | POA: Diagnosis not present

## 2019-03-28 DIAGNOSIS — D689 Coagulation defect, unspecified: Secondary | ICD-10-CM

## 2019-03-28 DIAGNOSIS — B351 Tinea unguium: Secondary | ICD-10-CM

## 2019-03-28 DIAGNOSIS — M79674 Pain in right toe(s): Secondary | ICD-10-CM

## 2019-03-28 DIAGNOSIS — E1142 Type 2 diabetes mellitus with diabetic polyneuropathy: Secondary | ICD-10-CM | POA: Diagnosis not present

## 2019-03-28 NOTE — Progress Notes (Signed)
Complaint:  Visit Type: Patient returns to my office for continued preventative foot care services. Complaint: Patient states" my nails have grown long and thick and become painful to walk and wear shoes" Patient has been diagnosed with DM with no foot complications. The patient presents for preventative foot care services. No changes to ROS.  Patient is taking plavix and eliquiss.  Podiatric Exam: Vascular: dorsalis pedis and posterior tibial pulses are palpable bilateral. Capillary return is immediate. Temperature gradient is WNL. Skin turgor WNL  Sensorium: Diminished  Semmes Weinstein monofilament test. Normal tactile sensation bilaterally. Nail Exam: Pt has thick disfigured discolored nails with subungual debris noted bilateral entire nail hallux through fifth toenails Ulcer Exam: There is no evidence of ulcer or pre-ulcerative changes or infection. Orthopedic Exam: Muscle tone and strength are WNL. No limitations in general ROM. No crepitus or effusions noted. Foot type and digits show no abnormalities. HAV  B/L with hammer toes. Skin: No Porokeratosis. No infection or ulcers  Diagnosis:  Onychomycosis, , Pain in right toe, pain in left toes  Treatment & Plan Procedures and Treatment: Consent by patient was obtained for treatment procedures. The patient understood the discussion of treatment and procedures well. All questions were answered thoroughly reviewed. Debridement of mycotic and hypertrophic toenails, 1 through 5 bilateral and clearing of subungual debris. No ulceration, no infection noted.   Return Visit-Office Procedure: Patient instructed to return to the office for a follow up visit 3 months for continued evaluation and treatment.    Gardiner Barefoot DPM

## 2019-04-04 ENCOUNTER — Encounter: Payer: Self-pay | Admitting: *Deleted

## 2019-04-04 ENCOUNTER — Encounter: Payer: Medicare Other | Attending: Cardiology | Admitting: *Deleted

## 2019-04-04 ENCOUNTER — Other Ambulatory Visit: Payer: Self-pay

## 2019-04-04 DIAGNOSIS — Z955 Presence of coronary angioplasty implant and graft: Secondary | ICD-10-CM

## 2019-04-04 DIAGNOSIS — I25110 Atherosclerotic heart disease of native coronary artery with unstable angina pectoris: Secondary | ICD-10-CM | POA: Insufficient documentation

## 2019-04-04 NOTE — Progress Notes (Signed)
Virtual Initial Orientation completed. Diagnosis can be found in [facility] 9/15. EP/RD Orientation scheduled for 9/29 at 9:30

## 2019-04-09 ENCOUNTER — Other Ambulatory Visit: Payer: Self-pay

## 2019-04-09 VITALS — Ht 67.0 in | Wt 227.8 lb

## 2019-04-09 DIAGNOSIS — I25110 Atherosclerotic heart disease of native coronary artery with unstable angina pectoris: Secondary | ICD-10-CM | POA: Diagnosis present

## 2019-04-09 DIAGNOSIS — Z955 Presence of coronary angioplasty implant and graft: Secondary | ICD-10-CM

## 2019-04-09 NOTE — Patient Instructions (Addendum)
Patient Instructions  Patient Details  Name: [name] Ferguson MRN: [id number] Date of Birth: 08-16-1940 Referring Provider:  Joaquim Nam, MD  Below are your personal goals for exercise, nutrition, and risk factors. Our goal is to help you stay on track towards obtaining and maintaining these goals. We will be discussing your progress on these goals with you throughout the program.  Initial Exercise Prescription: Initial Exercise Prescription - 04/09/19 1100      Date of Initial Exercise RX and Referring Provider   Date  04/09/19    Referring Provider  [place]      Recumbant Bike   Level  1    RPM  60    Minutes  15    METs  1.6      NuStep   Level  2    SPM  80    Minutes  15    METs  1.6      REL-XR   Level  2    Speed  50    Minutes  15    METs  1.6      Prescription Details   Frequency (times per week)  3    Duration  Progress to 30 minutes of continuous aerobic without signs/symptoms of physical distress      Intensity   THRR 40-80% of Max Heartrate  95-127    Ratings of Perceived Exertion  11-13    Perceived Dyspnea  0-4      Resistance Training   Training Prescription  Yes    Weight  3 lb   start if cleared by MD post defib    Reps  10-15       Exercise Goals: Frequency: Be able to perform aerobic exercise two to three times per week in program working toward 2-5 days per week of home exercise.  Intensity: Work with a perceived exertion of 11 (fairly light) - 15 (hard) while following your exercise prescription.  We will make changes to your prescription with you as you progress through the program.   Duration: Be able to do 30 to 45 minutes of continuous aerobic exercise in addition to a 5 minute warm-up and a 5 minute cool-down routine.   Nutrition Goals: Your personal nutrition goals will be established when you do your nutrition analysis with the dietician.  The following are general nutrition guidelines to follow: Cholesterol <  200mg /day Sodium < 1500mg /day Fiber:    Personal Goals: Personal Goals and Risk Factors at Admission - 04/09/19 1206      Core Components/Risk Factors/Patient Goals on Admission    Weight Management  Yes    Intervention  Weight Management: Develop a combined nutrition and exercise program designed to reach desired caloric intake, while maintaining appropriate intake of nutrient and fiber, sodium and fats, and appropriate energy expenditure required for the weight goal.    Admit Weight  227 lb 12.8 oz (103.3 kg)       Tobacco Use Initial Evaluation: Social History   Tobacco Use  Smoking Status Former Smoker  . Quit date: 07/11/1981  . Years since quitting: 37.7  Smokeless Tobacco Never Used    Exercise Goals and Review: Exercise Goals    Row Name 04/09/19 1203             Exercise Goals   Increase Physical Activity  Yes       Intervention  Provide advice, education, support and counseling about physical activity/exercise needs.;Develop an individualized exercise prescription for aerobic  and resistive training based on initial evaluation findings, risk stratification, comorbidities and participant's personal goals.       Expected Outcomes  Short Term: Attend rehab on a regular basis to increase amount of physical activity.;Long Term: Add in home exercise to make exercise part of routine and to increase amount of physical activity.;Long Term: Exercising regularly at least 3-5 days a week.       Increase Strength and Stamina  Yes       Intervention  Provide advice, education, support and counseling about physical activity/exercise needs.;Develop an individualized exercise prescription for aerobic and resistive training based on initial evaluation findings, risk stratification, comorbidities and participant's personal goals.       Expected Outcomes  Short Term: Increase workloads from initial exercise prescription for resistance, speed, and METs.;Short Term: Perform resistance training  exercises routinely during rehab and add in resistance training at home;Long Term: Improve cardiorespiratory fitness, muscular endurance and strength as measured by increased METs and functional capacity (6MWT)       Able to understand and use rate of perceived exertion (RPE) scale  Yes       Intervention  Provide education and explanation on how to use RPE scale       Expected Outcomes  Short Term: Able to use RPE daily in rehab to express subjective intensity level;Long Term:  Able to use RPE to guide intensity level when exercising independently       Able to understand and use Dyspnea scale  Yes       Intervention  Provide education and explanation on how to use Dyspnea scale       Expected Outcomes  Short Term: Able to use Dyspnea scale daily in rehab to express subjective sense of shortness of breath during exertion;Long Term: Able to use Dyspnea scale to guide intensity level when exercising independently       Knowledge and understanding of Target Heart Rate Range (THRR)  Yes       Intervention  Provide education and explanation of THRR including how the numbers were predicted and where they are located for reference       Expected Outcomes  Short Term: Able to state/look up THRR;Short Term: Able to use daily as guideline for intensity in rehab;Long Term: Able to use THRR to govern intensity when exercising independently       Able to check pulse independently  Yes       Intervention  Provide education and demonstration on how to check pulse in carotid and radial arteries.;Review the importance of being able to check your own pulse for safety during independent exercise       Expected Outcomes  Short Term: Able to explain why pulse checking is important during independent exercise;Long Term: Able to check pulse independently and accurately       Understanding of Exercise Prescription  Yes       Intervention  Provide education, explanation, and written materials on patient's individual exercise  prescription       Expected Outcomes  Short Term: Able to explain program exercise prescription;Long Term: Able to explain home exercise prescription to exercise independently          Copy of goals given to participant.

## 2019-04-09 NOTE — Progress Notes (Signed)
Cardiac Individual Treatment Plan  Patient Details  Name: [name] Ferguson MRN: [id number] Date of Birth: Mar 30, 1941 Referring Provider:     Cardiac Rehab from 04/09/2019 in [facility] Cardiac and Pulmonary Rehab  Referring Provider  [place]      Initial Encounter Date:    Cardiac Rehab from 04/09/2019 in [facility] Cardiac and Pulmonary Rehab  Date  04/09/19      Visit Diagnosis: Status post coronary artery stent placement  Patient's Home Medications on Admission:  Current Outpatient Medications:  .  acyclovir (ZOVIRAX) 400 MG tablet, TK 1 T PO TID FOR 5 DAYS FOR EACH FEVER BLISTER OUTBREAK, Disp: , Rfl:  .  amiodarone (PACERONE) 400 MG tablet, , Disp: , Rfl:  .  amoxicillin (AMOXIL) 500 MG capsule, Before dental procedures, Disp: , Rfl:  .  aspirin 81 MG tablet, Take 81 mg by mouth daily., Disp: , Rfl:  .  Besifloxacin HCl (BESIVANCE) 0.6 % SUSP, Apply to eye., Disp: , Rfl:  .  Blood Glucose Monitoring Suppl (ONETOUCH VERIO IQ SYSTEM) w/Device KIT, U UTD TO CHECK BS QID, Disp: , Rfl:  .  Brinzolamide-Brimonidine (SIMBRINZA) 1-0.2 % SUSP, Apply 1 drop to eye 2 (two) times daily., Disp: , Rfl:  .  clopidogrel (PLAVIX) 75 MG tablet, 1 tab by mouth daily, Disp: , Rfl:  .  dorzolamide (TRUSOPT) 2 % ophthalmic solution, Administer 2 drops to both eyes Two (2) times a day., Disp: , Rfl:  .  ELIQUIS 5 MG TABS tablet, , Disp: , Rfl:  .  ezetimibe (ZETIA) 10 MG tablet, , Disp: , Rfl:  .  FLUAD 0.5 ML SUSY, TO BE ADMINISTERED BY PHARMACIST FOR IMMUNIZATION, Disp: , Rfl: 0 .  fluticasone (FLONASE) 50 MCG/ACT nasal spray, U 1 SPR NASALLY BID, Disp: , Rfl:  .  Glucosamine HCl 1500 MG TABS, , Disp: , Rfl:  .  glucosamine-chondroitin 500-400 MG tablet, Take 1 tablet by mouth daily., Disp: , Rfl:  .  glucose blood (ONETOUCH VERIO) test strip, TEST 3 TIMES DAILY, Disp: , Rfl:  .  Insulin Detemir (LEVEMIR FLEXTOUCH) 100 UNIT/ML Pen, Inject 35-40 Units into the skin at bedtime. Says 50 units is prescribed but will  usually take 35-40 at night, Disp: , Rfl:  .  insulin lispro (HUMALOG) 100 UNIT/ML KiwkPen, 3 (three) times daily with meals. Sliding Scale., Disp: , Rfl:  .  Insulin Pen Needle (PEN NEEDLES 31GX5/16") 31G X 8 MM MISC, as directed, Disp: , Rfl:  .  INVOKANA 100 MG TABS tablet, , Disp: , Rfl:  .  isosorbide mononitrate (IMDUR) 30 MG 24 hr tablet, Take 30 mg by mouth daily. , Disp: , Rfl:  .  KLOR-CON M20 20 MEQ tablet, Take 20 mEq by mouth 2 (two) times daily., Disp: , Rfl: 2 .  LANTUS SOLOSTAR 100 UNIT/ML Solostar Pen, INJECT 45 UNITS UNDER SKIN ONCE DAILY, Disp: , Rfl: 0 .  latanoprost (XALATAN) 0.005 % ophthalmic solution, , Disp: , Rfl:  .  lisinopril (PRINIVIL,ZESTRIL) 2.5 MG tablet, Take 2.5 mg by mouth daily., Disp: , Rfl:  .  metFORMIN (GLUCOPHAGE) 1000 MG tablet, 1,000 mg 2 (two) times daily with a meal. , Disp: , Rfl:  .  metoprolol succinate (TOPROL-XL) 25 MG 24 hr tablet, TAKE 1/2 TABLET BY MOUTH EVERY DAY, Disp: , Rfl:  .  Multiple Vitamins-Minerals (MULTIVITAMIN ADULT PO), Take by mouth., Disp: , Rfl:  .  nitroGLYCERIN (NITROSTAT) 0.4 MG SL tablet, PLACE 1 TABLET UNDER THE TONGUE EVERY 5 MINUTES  AS NEEDED, Disp: , Rfl:  .  Omega-3 Fatty Acids (FISH OIL) 1000 MG CAPS, Take 4 capsules by mouth 2 (two) times daily. 4 capsules in morning and one at night, Disp: , Rfl:  .  Omega-3 Fatty Acids (OMEGA-3 FISH OIL PO), Take by mouth., Disp: , Rfl:  .  ONETOUCH DELICA LANCETS 40H MISC, USE 1 LANCET 3 TIMES A DAY, Disp: , Rfl:  .  oxyCODONE (OXY IR/ROXICODONE) 5 MG immediate release tablet, TAKE ONE TABLET BY MOUTH EVERY 4 HOURS AS NEEDED FOR UP TO 5 DAYS, Disp: , Rfl:  .  pravastatin (PRAVACHOL) 40 MG tablet, TAKE 1 TABLET BY MOUTH EVERY EVENING, Disp: , Rfl:  .  prednisoLONE acetate (PRED FORTE) 1 % ophthalmic suspension, PLACE 1 DROP INTO THE RIGHT EYE 6 (SIX) TIMES DAILY, Disp: , Rfl:  .  tamsulosin (FLOMAX) 0.4 MG CAPS capsule, Take 1 capsule (0.4 mg total) by mouth daily., Disp: 90  capsule, Rfl: 3 .  torsemide (DEMADEX) 20 MG tablet, TAKE 2 TABLETS BY MOUTH TWICE A DAY FOR 1 WEEK, THEN 2 IN THE MORNING AND 1 IN THE EVENING, Disp: , Rfl:  .  TRADJENTA 5 MG TABS tablet, , Disp: , Rfl:  .  triamcinolone cream (KENALOG) 0.1 %, Apply topically., Disp: , Rfl:  .  UNABLE TO FIND, Take by mouth., Disp: , Rfl:  .  vitamin B-12 (CYANOCOBALAMIN) 500 MCG tablet, Take 500 mcg by mouth daily., Disp: , Rfl:  .  vitamin C (ASCORBIC ACID) 500 MG tablet, Take 500 mg by mouth daily., Disp: , Rfl:   Past Medical History: Past Medical History:  Diagnosis Date  . Angina pectoris ([place])   . 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 BPH (benign prostatic hypertrophy) with urinary obstruction   . CHF (congestive heart failure) ([place])   . Diabetes mellitus without complication ([place])   . HLD (hyperlipidemia)   . Hypertension   . Obesity   . Penile adhesions   . Sleep apnea   . SOB (shortness of breath)   . Stroke [facility])     Tobacco Use: Social History   Tobacco Use  Smoking Status Former Smoker  . Quit date: 07/11/1981  . Years since quitting: 37.7  Smokeless Tobacco Never Used    Labs: Recent Review Flowsheet Data    There is no flowsheet data to display.       Exercise Target Goals: Exercise Program Goal: Individual exercise prescription set using results from initial 6 min walk test and THRR while considering  patient's activity barriers and safety.   Exercise Prescription Goal: Initial exercise prescription builds to 30-45 minutes a day of aerobic activity, 2-3 days per week.  Home exercise guidelines will be given to patient during program as part of exercise prescription that the participant will acknowledge.  Activity Barriers & Risk Stratification:   6 Minute Walk: 6 Minute Walk    Row Name 04/09/19 1156         6 Minute Walk   Phase  Initial     Distance  965 feet     Walk Time  6 minutes     # of Rest Breaks  0     MPH  1.83     METS  1.6      RPE  11     Perceived Dyspnea   1     VO2 Peak  5.6     Symptoms  No     Resting HR  64 bpm     Resting BP  96/48     Resting Oxygen Saturation   98 %     Exercise Oxygen Saturation  during 6 min walk  98 %     Max Ex. HR  101 bpm     Max Ex. BP  148/64     2 Minute Post BP  108/54        Oxygen Initial Assessment:   Oxygen Re-Evaluation:   Oxygen Discharge (Final Oxygen Re-Evaluation):   Initial Exercise Prescription: Initial Exercise Prescription - 04/09/19 1100      Date of Initial Exercise RX and Referring Provider   Date  04/09/19    Referring Provider  Newby      Recumbant Bike   Level  1    RPM  60    Minutes  15    METs  1.6      NuStep   Level  2    SPM  80    Minutes  15    METs  1.6      REL-XR   Level  2    Speed  50    Minutes  15    METs  1.6      Prescription Details   Frequency (times per week)  3    Duration  Progress to 30 minutes of continuous aerobic without signs/symptoms of physical distress      Intensity   THRR 40-80% of Max Heartrate  95-127    Ratings of Perceived Exertion  11-13    Perceived Dyspnea  0-4      Resistance Training   Training Prescription  Yes    Weight  3 lb   start if cleared by MD post defib    Reps  10-15       Perform Capillary Blood Glucose checks as needed.  Exercise Prescription Changes: Exercise Prescription Changes    Row Name 04/09/19 1200             Response to Exercise   Blood Pressure (Admit)  96/48       Blood Pressure (Exercise)  148/64       Blood Pressure (Exit)  108/54       Heart Rate (Admit)  64 bpm       Heart Rate (Exercise)  101 bpm       Heart Rate (Exit)  82 bpm       Oxygen Saturation (Admit)  98 %       Oxygen Saturation (Exercise)  98 %       Rating of Perceived Exertion (Exercise)  11       Perceived Dyspnea (Exercise)  1          Exercise Comments:   Exercise Goals and Review: Exercise Goals    Row Name 04/09/19 1203             Exercise Goals    Increase Physical Activity  Yes       Intervention  Provide advice, education, support and counseling about physical activity/exercise needs.;Develop an individualized exercise prescription for aerobic and resistive training based on initial evaluation findings, risk stratification, comorbidities and participant's personal goals.       Expected Outcomes  Short Term: Attend rehab on a regular basis to increase amount of physical activity.;Long Term: Add in home exercise to make exercise part of routine and to increase amount of physical activity.;Long Term: Exercising regularly at least 3-5 days a week.  Increase Strength and Stamina  Yes       Intervention  Provide advice, education, support and counseling about physical activity/exercise needs.;Develop an individualized exercise prescription for aerobic and resistive training based on initial evaluation findings, risk stratification, comorbidities and participant's personal goals.       Expected Outcomes  Short Term: Increase workloads from initial exercise prescription for resistance, speed, and METs.;Short Term: Perform resistance training exercises routinely during rehab and add in resistance training at home;Long Term: Improve cardiorespiratory fitness, muscular endurance and strength as measured by increased METs and functional capacity (6MWT)       Able to understand and use rate of perceived exertion (RPE) scale  Yes       Intervention  Provide education and explanation on how to use RPE scale       Expected Outcomes  Short Term: Able to use RPE daily in rehab to express subjective intensity level;Long Term:  Able to use RPE to guide intensity level when exercising independently       Able to understand and use Dyspnea scale  Yes       Intervention  Provide education and explanation on how to use Dyspnea scale       Expected Outcomes  Short Term: Able to use Dyspnea scale daily in rehab to express subjective sense of shortness of breath  during exertion;Long Term: Able to use Dyspnea scale to guide intensity level when exercising independently       Knowledge and understanding of Target Heart Rate Range (THRR)  Yes       Intervention  Provide education and explanation of THRR including how the numbers were predicted and where they are located for reference       Expected Outcomes  Short Term: Able to state/look up THRR;Short Term: Able to use daily as guideline for intensity in rehab;Long Term: Able to use THRR to govern intensity when exercising independently       Able to check pulse independently  Yes       Intervention  Provide education and demonstration on how to check pulse in carotid and radial arteries.;Review the importance of being able to check your own pulse for safety during independent exercise       Expected Outcomes  Short Term: Able to explain why pulse checking is important during independent exercise;Long Term: Able to check pulse independently and accurately       Understanding of Exercise Prescription  Yes       Intervention  Provide education, explanation, and written materials on patient's individual exercise prescription       Expected Outcomes  Short Term: Able to explain program exercise prescription;Long Term: Able to explain home exercise prescription to exercise independently          Exercise Goals Re-Evaluation :   Discharge Exercise Prescription (Final Exercise Prescription Changes): Exercise Prescription Changes - 04/09/19 1200      Response to Exercise   Blood Pressure (Admit)  96/48    Blood Pressure (Exercise)  148/64    Blood Pressure (Exit)  108/54    Heart Rate (Admit)  64 bpm    Heart Rate (Exercise)  101 bpm    Heart Rate (Exit)  82 bpm    Oxygen Saturation (Admit)  98 %    Oxygen Saturation (Exercise)  98 %    Rating of Perceived Exertion (Exercise)  11    Perceived Dyspnea (Exercise)  1       Nutrition:  Target Goals:  Understanding of nutrition guidelines, daily intake of  sodium <1582m, cholesterol <2078m calories 30% from fat and 7% or less from saturated fats, daily to have 5 or more servings of fruits and vegetables.  Biometrics: Pre Biometrics - 04/09/19 1203      Pre Biometrics   Height  _0  (1.702 m)    Weight  227 lb 12.8 oz (103.3 kg)    BMI (Calculated)  35.67    Single Leg Stand  2.95 seconds        Nutrition Therapy Plan and Nutrition Goals: Nutrition Therapy & Goals - 04/09/19 1042      Nutrition Therapy   Diet  Low Na, HH diet    Protein (specify units)  82g    Fiber  30 grams    Whole Grain Foods  3 servings    Saturated Fats  12 max. grams    Fruits and Vegetables  5 servings/day    Sodium  1.5 grams      Personal Nutrition Goals   Nutrition Goal  ST: no goal LT: live longer    Comments  Pt reports that his diet is "erratic" eating breakfast later and dinner earlier, pt reports that he lost 10 pounds and tries to monitor his portions. PT reports monitoring Na and not using any in his cooking. Pt reports limiting cured meats but will have some bacon with his eggs. Discussed HH eating and DM friendly eating - pt reports that he does not want to make goals and that he may not be here for another 30 days to make new goals as he exercises at home and doesn't think this will be valuble.      Intervention Plan   Intervention  Prescribe, educate and counsel regarding individualized specific dietary modifications aiming towards targeted core components such as weight, hypertension, lipid management, diabetes, heart failure and other comorbidities.;Nutrition handout(s) given to patient.    Expected Outcomes  Short Term Goal: Understand basic principles of dietary content, such as calories, fat, sodium, cholesterol and nutrients.;Short Term Goal: A plan has been developed with personal nutrition goals set during dietitian appointment.;Long Term Goal: Adherence to prescribed nutrition plan.       Nutrition Assessments:   Nutrition Goals  Re-Evaluation:   Nutrition Goals Discharge (Final Nutrition Goals Re-Evaluation):   Psychosocial: Target Goals: Acknowledge presence or absence of significant depression and/or stress, maximize coping skills, provide positive support system. Participant is able to verbalize types and ability to use techniques and skills needed for reducing stress and depression.   Initial Review & Psychosocial Screening: Initial Psych Review & Screening - 04/04/19 1119      Initial Review   Current issues with  Current Stress Concerns    Comments  GeZackoryecently went into cardiac arrest which prompted the ICD placement and referral to Cardiac Rehab. His wife is very involved in his care. They are wanting him to exercise under supervision and learn more about his health issues. They reported some stress in coping with all the newness/managing and that once they get him into Cardiac Rehab they will feel a lot better      [place] Yes      Screening Interventions   Interventions  Encouraged to exercise;To provide support and resources with identified psychosocial needs;Provide feedback about the scores to participant    Expected Outcomes  Short Term goal: Utilizing psychosocial counselor, staff and physician to assist with identification of specific Stressors or  current issues interfering with healing process. Setting desired goal for each stressor or current issue identified.;Long Term Goal: Stressors or current issues are controlled or eliminated.;Short Term goal: Identification and review with participant of any Quality of Life or Depression concerns found by scoring the questionnaire.;Long Term goal: The participant improves quality of Life and PHQ9 Scores as seen by post scores and/or verbalization of changes       Quality of Life Scores:  Quality of Life - 04/09/19 1207      Quality of Life   Select  Quality of Life      Quality of Life Scores   Health/Function Pre   20.8 %    Socioeconomic Pre  30 %    Psych/Spiritual Pre  30 %    Family Pre  25.2 %    GLOBAL Pre  25.37 %      Scores of 19 and below usually indicate a poorer quality of life in these areas.  A difference of  2-3 points is a clinically meaningful difference.  A difference of 2-3 points in the total score of the Quality of Life Index has been associated with significant improvement in overall quality of life, self-image, physical symptoms, and general health in studies assessing change in quality of life.  PHQ-9: Recent Review Flowsheet Data    There is no flowsheet data to display.     Interpretation of Total Score  Total Score Depression Severity:  1-4 = Minimal depression, 5-9 = Mild depression, 10-14 = Moderate depression, 15-19 = Moderately severe depression, 20-27 = Severe depression   Psychosocial Evaluation and Intervention: Psychosocial Evaluation - 04/04/19 1127      Psychosocial Evaluation & Interventions   Comments  Quientin recently went into cardiac arrest which prompted the ICD placement and referral to Cardiac Rehab. His wife is very involved in his care. They are wanting him to exercise under supervision and learn more about his health issues. They reported some stress in coping with all the newness/managing and that once they get him into Cardiac Rehab they will feel a lot better    Expected Outcomes  Short: attend [facility] on a consistent basis. Long: develop self care habits    Continue Psychosocial Services   Follow up required by staff       Psychosocial Re-Evaluation:   Psychosocial Discharge (Final Psychosocial Re-Evaluation):   Vocational Rehabilitation: Provide vocational rehab assistance to qualifying candidates.   Vocational Rehab Evaluation & Intervention: Vocational Rehab - 04/04/19 1119      Initial Vocational Rehab Evaluation & Intervention   Assessment shows need for Vocational Rehabilitation  No       Education: Education Goals:  Education classes will be provided on a variety of topics geared toward better understanding of heart health and risk factor modification. Participant will state understanding/return demonstration of topics presented as noted by education test scores.  Learning Barriers/Preferences: Learning Barriers/Preferences - 04/04/19 1119      Learning Barriers/Preferences   Learning Barriers  None    Learning Preferences  None       Education Topics:  AED/CPR: - Group verbal and written instruction with the use of models to demonstrate the basic use of the AED with the basic ABC's of resuscitation.   General Nutrition Guidelines/Fats and Fiber: -Group instruction provided by verbal, written material, models and posters to present the general guidelines for heart healthy nutrition. Gives an explanation and review of dietary fats and fiber.   Controlling Sodium/Reading Food Labels: -Group  verbal and written material supporting the discussion of sodium use in heart healthy nutrition. Review and explanation with models, verbal and written materials for utilization of the food label.   Exercise Physiology & General Exercise Guidelines: - Group verbal and written instruction with models to review the exercise physiology of the cardiovascular system and associated critical values. Provides general exercise guidelines with specific guidelines to those with heart or lung disease.    Aerobic Exercise & Resistance Training: - Gives group verbal and written instruction on the various components of exercise. Focuses on aerobic and resistive training programs and the benefits of this training and how to safely progress through these programs..   Flexibility, Balance, Mind/Body Relaxation: Provides group verbal/written instruction on the benefits of flexibility and balance training, including mind/body exercise modes such as yoga, pilates and tai chi.  Demonstration and skill practice provided.   Stress  and Anxiety: - Provides group verbal and written instruction about the health risks of elevated stress and causes of high stress.  Discuss the correlation between heart/lung disease and anxiety and treatment options. Review healthy ways to manage with stress and anxiety.   Depression: - Provides group verbal and written instruction on the correlation between heart/lung disease and depressed mood, treatment options, and the stigmas associated with seeking treatment.   Anatomy & Physiology of the Heart: - Group verbal and written instruction and models provide basic cardiac anatomy and physiology, with the coronary electrical and arterial systems. Review of Valvular disease and Heart Failure   Cardiac Procedures: - Group verbal and written instruction to review commonly prescribed medications for heart disease. Reviews the medication, class of the drug, and side effects. Includes the steps to properly store meds and maintain the prescription regimen. (beta blockers and nitrates)   Cardiac Medications I: - Group verbal and written instruction to review commonly prescribed medications for heart disease. Reviews the medication, class of the drug, and side effects. Includes the steps to properly store meds and maintain the prescription regimen.   Cardiac Medications II: -Group verbal and written instruction to review commonly prescribed medications for heart disease. Reviews the medication, class of the drug, and side effects. (all other drug classes)    Go Sex-Intimacy & Heart Disease, Get SMART - Goal Setting: - Group verbal and written instruction through game format to discuss heart disease and the return to sexual intimacy. Provides group verbal and written material to discuss and apply goal setting through the application of the S.M.A.R.T. Method.   Other Matters of the Heart: - Provides group verbal, written materials and models to describe Stable Angina and Peripheral Artery. Includes  description of the disease process and treatment options available to the cardiac patient.   Exercise & Equipment Safety: - Individual verbal instruction and demonstration of equipment use and safety with use of the equipment.   Cardiac Rehab from 04/09/2019 in [facility] Cardiac [facility] Pulmonary Rehab  Date  04/09/19  Educator  AS  Instruction Review Code  1- Verbalizes Understanding      Infection Prevention: - Provides verbal and written material to individual with discussion of infection control including proper hand washing and proper equipment cleaning during exercise session.   Cardiac Rehab from 04/09/2019 in [facility] Cardiac and Pulmonary Rehab  Date  04/09/19  Educator  AS  Instruction Review Code  1- Verbalizes Understanding      Falls Prevention: - Provides verbal and written material to individual with discussion of falls prevention and safety.   Cardiac Rehab from  04/09/2019 in [facility] Cardiac and Pulmonary Rehab  Date  04/09/19  Educator  AS  Instruction Review Code  1- Verbalizes Understanding      Diabetes: - Individual verbal and written instruction to review signs/symptoms of diabetes, desired ranges of glucose level fasting, after meals and with exercise. Acknowledge that pre and post exercise glucose checks will be done for 3 sessions at entry of program.   Cardiac Rehab from 04/04/2019 in [facility] Cardiac and Pulmonary Rehab  Date  04/04/19  Educator  [facility]  Instruction Review Code  1- Verbalizes Understanding      Know Your Numbers and Risk Factors: -Group verbal and written instruction about important numbers in your health.  Discussion of what are risk factors and how they play a role in the disease process.  Review of Cholesterol, Blood Pressure, Diabetes, and BMI and the role they play in your overall health.   Sleep Hygiene: -Provides group verbal and written instruction about how sleep can affect your health.  Define sleep hygiene, discuss sleep cycles and impact of  sleep habits. Review good sleep hygiene tips.    Other: -Provides group and verbal instruction on various topics (see comments)   Knowledge Questionnaire Score: Knowledge Questionnaire Score - 04/09/19 1208      Knowledge Questionnaire Score   Pre Score  20/26       Core Components/Risk Factors/Patient Goals at Admission: Personal Goals and Risk Factors at Admission - 04/09/19 1206      Core Components/Risk Factors/Patient Goals on Admission    Weight Management  Yes    Intervention  Weight Management: Develop a combined nutrition and exercise program designed to reach desired caloric intake, while maintaining appropriate intake of nutrient and fiber, sodium and fats, and appropriate energy expenditure required for the weight goal.    Admit Weight  227 lb 12.8 oz (103.3 kg)       Core Components/Risk Factors/Patient Goals Review:    Core Components/Risk Factors/Patient Goals at Discharge (Final Review):    ITP Comments: ITP Comments    Row Name 04/04/19 1109 04/09/19 1204         ITP Comments  Virtual Initial Orientation completed. Diagnosis can be found in [facility] 9/15. EP/RD Orientation scheduled for 9/29 at 9:30  6 MWT and nutrition consult completed.  ITP created and sent to Dr Sabra Heck for review         Comments: initial ITP

## 2019-04-10 DIAGNOSIS — N1831 Chronic kidney disease, stage 3a: Secondary | ICD-10-CM | POA: Insufficient documentation

## 2019-04-10 DIAGNOSIS — Z8674 Personal history of sudden cardiac arrest: Secondary | ICD-10-CM | POA: Insufficient documentation

## 2019-04-15 ENCOUNTER — Encounter: Payer: Medicare Other | Attending: Cardiology

## 2019-04-15 DIAGNOSIS — Z48812 Encounter for surgical aftercare following surgery on the circulatory system: Secondary | ICD-10-CM | POA: Insufficient documentation

## 2019-04-15 DIAGNOSIS — G4730 Sleep apnea, unspecified: Secondary | ICD-10-CM | POA: Insufficient documentation

## 2019-04-15 DIAGNOSIS — E119 Type 2 diabetes mellitus without complications: Secondary | ICD-10-CM | POA: Insufficient documentation

## 2019-04-15 DIAGNOSIS — Z7901 Long term (current) use of anticoagulants: Secondary | ICD-10-CM | POA: Insufficient documentation

## 2019-04-15 DIAGNOSIS — E785 Hyperlipidemia, unspecified: Secondary | ICD-10-CM | POA: Insufficient documentation

## 2019-04-15 DIAGNOSIS — Z7902 Long term (current) use of antithrombotics/antiplatelets: Secondary | ICD-10-CM | POA: Insufficient documentation

## 2019-04-15 DIAGNOSIS — Z794 Long term (current) use of insulin: Secondary | ICD-10-CM | POA: Insufficient documentation

## 2019-04-15 DIAGNOSIS — Z955 Presence of coronary angioplasty implant and graft: Secondary | ICD-10-CM | POA: Insufficient documentation

## 2019-04-15 DIAGNOSIS — Z8673 Personal history of transient ischemic attack (TIA), and cerebral infarction without residual deficits: Secondary | ICD-10-CM | POA: Insufficient documentation

## 2019-04-15 DIAGNOSIS — I509 Heart failure, unspecified: Secondary | ICD-10-CM | POA: Insufficient documentation

## 2019-04-15 DIAGNOSIS — I110 Hypertensive heart disease with heart failure: Secondary | ICD-10-CM | POA: Insufficient documentation

## 2019-04-15 DIAGNOSIS — E669 Obesity, unspecified: Secondary | ICD-10-CM | POA: Insufficient documentation

## 2019-04-15 DIAGNOSIS — Z7951 Long term (current) use of inhaled steroids: Secondary | ICD-10-CM | POA: Insufficient documentation

## 2019-04-15 DIAGNOSIS — Z6835 Body mass index (BMI) 35.0-35.9, adult: Secondary | ICD-10-CM | POA: Insufficient documentation

## 2019-04-15 DIAGNOSIS — Z79899 Other long term (current) drug therapy: Secondary | ICD-10-CM | POA: Insufficient documentation

## 2019-04-17 ENCOUNTER — Encounter: Payer: Self-pay | Admitting: *Deleted

## 2019-04-17 DIAGNOSIS — Z955 Presence of coronary angioplasty implant and graft: Secondary | ICD-10-CM

## 2019-04-17 NOTE — Progress Notes (Signed)
Cardiac Individual Treatment Plan  Patient Details  Name: [name] HOBBS MRN: [id number] Date of Birth: Mar 30, 1941 Referring Provider:     Cardiac Rehab from 04/09/2019 in [facility] Cardiac and Pulmonary Rehab  Referring Provider  [place]      Initial Encounter Date:    Cardiac Rehab from 04/09/2019 in [facility] Cardiac and Pulmonary Rehab  Date  04/09/19      Visit Diagnosis: Status post coronary artery stent placement  Patient's Home Medications on Admission:  Current Outpatient Medications:  .  acyclovir (ZOVIRAX) 400 MG tablet, TK 1 T PO TID FOR 5 DAYS FOR EACH FEVER BLISTER OUTBREAK, Disp: , Rfl:  .  amiodarone (PACERONE) 400 MG tablet, , Disp: , Rfl:  .  amoxicillin (AMOXIL) 500 MG capsule, Before dental procedures, Disp: , Rfl:  .  aspirin 81 MG tablet, Take 81 mg by mouth daily., Disp: , Rfl:  .  Besifloxacin HCl (BESIVANCE) 0.6 % SUSP, Apply to eye., Disp: , Rfl:  .  Blood Glucose Monitoring Suppl (ONETOUCH VERIO IQ SYSTEM) w/Device KIT, U UTD TO CHECK BS QID, Disp: , Rfl:  .  Brinzolamide-Brimonidine (SIMBRINZA) 1-0.2 % SUSP, Apply 1 drop to eye 2 (two) times daily., Disp: , Rfl:  .  clopidogrel (PLAVIX) 75 MG tablet, 1 tab by mouth daily, Disp: , Rfl:  .  dorzolamide (TRUSOPT) 2 % ophthalmic solution, Administer 2 drops to both eyes Two (2) times a day., Disp: , Rfl:  .  ELIQUIS 5 MG TABS tablet, , Disp: , Rfl:  .  ezetimibe (ZETIA) 10 MG tablet, , Disp: , Rfl:  .  FLUAD 0.5 ML SUSY, TO BE ADMINISTERED BY PHARMACIST FOR IMMUNIZATION, Disp: , Rfl: 0 .  fluticasone (FLONASE) 50 MCG/ACT nasal spray, U 1 SPR NASALLY BID, Disp: , Rfl:  .  Glucosamine HCl 1500 MG TABS, , Disp: , Rfl:  .  glucosamine-chondroitin 500-400 MG tablet, Take 1 tablet by mouth daily., Disp: , Rfl:  .  glucose blood (ONETOUCH VERIO) test strip, TEST 3 TIMES DAILY, Disp: , Rfl:  .  Insulin Detemir (LEVEMIR FLEXTOUCH) 100 UNIT/ML Pen, Inject 35-40 Units into the skin at bedtime. Says 50 units is prescribed but will  usually take 35-40 at night, Disp: , Rfl:  .  insulin lispro (HUMALOG) 100 UNIT/ML KiwkPen, 3 (three) times daily with meals. Sliding Scale., Disp: , Rfl:  .  Insulin Pen Needle (PEN NEEDLES 31GX5/16") 31G X 8 MM MISC, as directed, Disp: , Rfl:  .  INVOKANA 100 MG TABS tablet, , Disp: , Rfl:  .  isosorbide mononitrate (IMDUR) 30 MG 24 hr tablet, Take 30 mg by mouth daily. , Disp: , Rfl:  .  KLOR-CON M20 20 MEQ tablet, Take 20 mEq by mouth 2 (two) times daily., Disp: , Rfl: 2 .  LANTUS SOLOSTAR 100 UNIT/ML Solostar Pen, INJECT 45 UNITS UNDER SKIN ONCE DAILY, Disp: , Rfl: 0 .  latanoprost (XALATAN) 0.005 % ophthalmic solution, , Disp: , Rfl:  .  lisinopril (PRINIVIL,ZESTRIL) 2.5 MG tablet, Take 2.5 mg by mouth daily., Disp: , Rfl:  .  metFORMIN (GLUCOPHAGE) 1000 MG tablet, 1,000 mg 2 (two) times daily with a meal. , Disp: , Rfl:  .  metoprolol succinate (TOPROL-XL) 25 MG 24 hr tablet, TAKE 1/2 TABLET BY MOUTH EVERY DAY, Disp: , Rfl:  .  Multiple Vitamins-Minerals (MULTIVITAMIN ADULT PO), Take by mouth., Disp: , Rfl:  .  nitroGLYCERIN (NITROSTAT) 0.4 MG SL tablet, PLACE 1 TABLET UNDER THE TONGUE EVERY 5 MINUTES  AS NEEDED, Disp: , Rfl:  .  Omega-3 Fatty Acids (FISH OIL) 1000 MG CAPS, Take 4 capsules by mouth 2 (two) times daily. 4 capsules in morning and one at night, Disp: , Rfl:  .  Omega-3 Fatty Acids (OMEGA-3 FISH OIL PO), Take by mouth., Disp: , Rfl:  .  ONETOUCH DELICA LANCETS 40H MISC, USE 1 LANCET 3 TIMES A DAY, Disp: , Rfl:  .  oxyCODONE (OXY IR/ROXICODONE) 5 MG immediate release tablet, TAKE ONE TABLET BY MOUTH EVERY 4 HOURS AS NEEDED FOR UP TO 5 DAYS, Disp: , Rfl:  .  pravastatin (PRAVACHOL) 40 MG tablet, TAKE 1 TABLET BY MOUTH EVERY EVENING, Disp: , Rfl:  .  prednisoLONE acetate (PRED FORTE) 1 % ophthalmic suspension, PLACE 1 DROP INTO THE RIGHT EYE 6 (SIX) TIMES DAILY, Disp: , Rfl:  .  tamsulosin (FLOMAX) 0.4 MG CAPS capsule, Take 1 capsule (0.4 mg total) by mouth daily., Disp: 90  capsule, Rfl: 3 .  torsemide (DEMADEX) 20 MG tablet, TAKE 2 TABLETS BY MOUTH TWICE A DAY FOR 1 WEEK, THEN 2 IN THE MORNING AND 1 IN THE EVENING, Disp: , Rfl:  .  TRADJENTA 5 MG TABS tablet, , Disp: , Rfl:  .  triamcinolone cream (KENALOG) 0.1 %, Apply topically., Disp: , Rfl:  .  UNABLE TO FIND, Take by mouth., Disp: , Rfl:  .  vitamin B-12 (CYANOCOBALAMIN) 500 MCG tablet, Take 500 mcg by mouth daily., Disp: , Rfl:  .  vitamin C (ASCORBIC ACID) 500 MG tablet, Take 500 mg by mouth daily., Disp: , Rfl:   Past Medical History: Past Medical History:  Diagnosis Date  . Angina pectoris ([place])   . 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 BPH (benign prostatic hypertrophy) with urinary obstruction   . CHF (congestive heart failure) ([place])   . Diabetes mellitus without complication ([place])   . HLD (hyperlipidemia)   . Hypertension   . Obesity   . Penile adhesions   . Sleep apnea   . SOB (shortness of breath)   . Stroke [facility])     Tobacco Use: Social History   Tobacco Use  Smoking Status Former Smoker  . Quit date: 07/11/1981  . Years since quitting: 37.7  Smokeless Tobacco Never Used    Labs: Recent Review Flowsheet Data    There is no flowsheet data to display.       Exercise Target Goals: Exercise Program Goal: Individual exercise prescription set using results from initial 6 min walk test and THRR while considering  patient's activity barriers and safety.   Exercise Prescription Goal: Initial exercise prescription builds to 30-45 minutes a day of aerobic activity, 2-3 days per week.  Home exercise guidelines will be given to patient during program as part of exercise prescription that the participant will acknowledge.  Activity Barriers & Risk Stratification:   6 Minute Walk: 6 Minute Walk    Row Name 04/09/19 1156         6 Minute Walk   Phase  Initial     Distance  965 feet     Walk Time  6 minutes     # of Rest Breaks  0     MPH  1.83     METS  1.6      RPE  11     Perceived Dyspnea   1     VO2 Peak  5.6     Symptoms  No     Resting HR  64 bpm     Resting BP  96/48     Resting Oxygen Saturation   98 %     Exercise Oxygen Saturation  during 6 min walk  98 %     Max Ex. HR  101 bpm     Max Ex. BP  148/64     2 Minute Post BP  108/54        Oxygen Initial Assessment:   Oxygen Re-Evaluation:   Oxygen Discharge (Final Oxygen Re-Evaluation):   Initial Exercise Prescription: Initial Exercise Prescription - 04/09/19 1100      Date of Initial Exercise RX and Referring Provider   Date  04/09/19    Referring Provider  [place]      Recumbant Bike   Level  1    RPM  60    Minutes  15    METs  1.6      NuStep   Level  2    SPM  80    Minutes  15    METs  1.6      REL-XR   Level  2    Speed  50    Minutes  15    METs  1.6      Prescription Details   Frequency (times per week)  3    Duration  Progress to 30 minutes of continuous aerobic without signs/symptoms of physical distress      Intensity   THRR 40-80% of Max Heartrate  95-127    Ratings of Perceived Exertion  11-13    Perceived Dyspnea  0-4      Resistance Training   Training Prescription  Yes    Weight  3 lb   start if cleared by MD post defib    Reps  10-15       Perform Capillary Blood Glucose checks as needed.  Exercise Prescription Changes: Exercise Prescription Changes    Row Name 04/09/19 1200             Response to Exercise   Blood Pressure (Admit)  96/48       Blood Pressure (Exercise)  148/64       Blood Pressure (Exit)  108/54       Heart Rate (Admit)  64 bpm       Heart Rate (Exercise)  101 bpm       Heart Rate (Exit)  82 bpm       Oxygen Saturation (Admit)  98 %       Oxygen Saturation (Exercise)  98 %       Rating of Perceived Exertion (Exercise)  11       Perceived Dyspnea (Exercise)  1          Exercise Comments:   Exercise Goals and Review: Exercise Goals    Row Name 04/09/19 1203             Exercise Goals    Increase Physical Activity  Yes       Intervention  Provide advice, education, support and counseling about physical activity/exercise needs.;Develop an individualized exercise prescription for aerobic and resistive training based on initial evaluation findings, risk stratification, comorbidities and participant's personal goals.       Expected Outcomes  Short Term: Attend rehab on a regular basis to increase amount of physical activity.;Long Term: Add in home exercise to make exercise part of routine and to increase amount of physical activity.;Long Term: Exercising regularly at least 3-5 days a week.  Increase Strength and Stamina  Yes       Intervention  Provide advice, education, support and counseling about physical activity/exercise needs.;Develop an individualized exercise prescription for aerobic and resistive training based on initial evaluation findings, risk stratification, comorbidities and participant's personal goals.       Expected Outcomes  Short Term: Increase workloads from initial exercise prescription for resistance, speed, and METs.;Short Term: Perform resistance training exercises routinely during rehab and add in resistance training at home;Long Term: Improve cardiorespiratory fitness, muscular endurance and strength as measured by increased METs and functional capacity (6MWT)       Able to understand and use rate of perceived exertion (RPE) scale  Yes       Intervention  Provide education and explanation on how to use RPE scale       Expected Outcomes  Short Term: Able to use RPE daily in rehab to express subjective intensity level;Long Term:  Able to use RPE to guide intensity level when exercising independently       Able to understand and use Dyspnea scale  Yes       Intervention  Provide education and explanation on how to use Dyspnea scale       Expected Outcomes  Short Term: Able to use Dyspnea scale daily in rehab to express subjective sense of shortness of breath  during exertion;Long Term: Able to use Dyspnea scale to guide intensity level when exercising independently       Knowledge and understanding of Target Heart Rate Range (THRR)  Yes       Intervention  Provide education and explanation of THRR including how the numbers were predicted and where they are located for reference       Expected Outcomes  Short Term: Able to state/look up THRR;Short Term: Able to use daily as guideline for intensity in rehab;Long Term: Able to use THRR to govern intensity when exercising independently       Able to check pulse independently  Yes       Intervention  Provide education and demonstration on how to check pulse in carotid and radial arteries.;Review the importance of being able to check your own pulse for safety during independent exercise       Expected Outcomes  Short Term: Able to explain why pulse checking is important during independent exercise;Long Term: Able to check pulse independently and accurately       Understanding of Exercise Prescription  Yes       Intervention  Provide education, explanation, and written materials on patient's individual exercise prescription       Expected Outcomes  Short Term: Able to explain program exercise prescription;Long Term: Able to explain home exercise prescription to exercise independently          Exercise Goals Re-Evaluation :   Discharge Exercise Prescription (Final Exercise Prescription Changes): Exercise Prescription Changes - 04/09/19 1200      Response to Exercise   Blood Pressure (Admit)  96/48    Blood Pressure (Exercise)  148/64    Blood Pressure (Exit)  108/54    Heart Rate (Admit)  64 bpm    Heart Rate (Exercise)  101 bpm    Heart Rate (Exit)  82 bpm    Oxygen Saturation (Admit)  98 %    Oxygen Saturation (Exercise)  98 %    Rating of Perceived Exertion (Exercise)  11    Perceived Dyspnea (Exercise)  1       Nutrition:  Target Goals:  Understanding of nutrition guidelines, daily intake of  sodium <1582m, cholesterol <2078m calories 30% from fat and 7% or less from saturated fats, daily to have 5 or more servings of fruits and vegetables.  Biometrics: Pre Biometrics - 04/09/19 1203      Pre Biometrics   Height  _0  (1.702 m)    Weight  227 lb 12.8 oz (103.3 kg)    BMI (Calculated)  35.67    Single Leg Stand  2.95 seconds        Nutrition Therapy Plan and Nutrition Goals: Nutrition Therapy & Goals - 04/09/19 1042      Nutrition Therapy   Diet  Low Na, HH diet    Protein (specify units)  82g    Fiber  30 grams    Whole Grain Foods  3 servings    Saturated Fats  12 max. grams    Fruits and Vegetables  5 servings/day    Sodium  1.5 grams      Personal Nutrition Goals   Nutrition Goal  ST: no goal LT: live longer    Comments  Pt reports that his diet is "erratic" eating breakfast later and dinner earlier, pt reports that he lost 10 pounds and tries to monitor his portions. PT reports monitoring Na and not using any in his cooking. Pt reports limiting cured meats but will have some bacon with his eggs. Discussed HH eating and DM friendly eating - pt reports that he does not want to make goals and that he may not be here for another 30 days to make new goals as he exercises at home and doesn't think this will be valuble.      Intervention Plan   Intervention  Prescribe, educate and counsel regarding individualized specific dietary modifications aiming towards targeted core components such as weight, hypertension, lipid management, diabetes, heart failure and other comorbidities.;Nutrition handout(s) given to patient.    Expected Outcomes  Short Term Goal: Understand basic principles of dietary content, such as calories, fat, sodium, cholesterol and nutrients.;Short Term Goal: A plan has been developed with personal nutrition goals set during dietitian appointment.;Long Term Goal: Adherence to prescribed nutrition plan.       Nutrition Assessments:   Nutrition Goals  Re-Evaluation:   Nutrition Goals Discharge (Final Nutrition Goals Re-Evaluation):   Psychosocial: Target Goals: Acknowledge presence or absence of significant depression and/or stress, maximize coping skills, provide positive support system. Participant is able to verbalize types and ability to use techniques and skills needed for reducing stress and depression.   Initial Review & Psychosocial Screening: Initial Psych Review & Screening - 04/04/19 1119      Initial Review   Current issues with  Current Stress Concerns    Comments  GeZackoryecently went into cardiac arrest which prompted the ICD placement and referral to Cardiac Rehab. His wife is very involved in his care. They are wanting him to exercise under supervision and learn more about his health issues. They reported some stress in coping with all the newness/managing and that once they get him into Cardiac Rehab they will feel a lot better      [place] Yes      Screening Interventions   Interventions  Encouraged to exercise;To provide support and resources with identified psychosocial needs;Provide feedback about the scores to participant    Expected Outcomes  Short Term goal: Utilizing psychosocial counselor, staff and physician to assist with identification of specific Stressors or  current issues interfering with healing process. Setting desired goal for each stressor or current issue identified.;Long Term Goal: Stressors or current issues are controlled or eliminated.;Short Term goal: Identification and review with participant of any Quality of Life or Depression concerns found by scoring the questionnaire.;Long Term goal: The participant improves quality of Life and PHQ9 Scores as seen by post scores and/or verbalization of changes       Quality of Life Scores:  Quality of Life - 04/09/19 1207      Quality of Life   Select  Quality of Life      Quality of Life Scores   Health/Function Pre   20.8 %    Socioeconomic Pre  30 %    Psych/Spiritual Pre  30 %    Family Pre  25.2 %    GLOBAL Pre  25.37 %      Scores of 19 and below usually indicate a poorer quality of life in these areas.  A difference of  2-3 points is a clinically meaningful difference.  A difference of 2-3 points in the total score of the Quality of Life Index has been associated with significant improvement in overall quality of life, self-image, physical symptoms, and general health in studies assessing change in quality of life.  PHQ-9: Recent Review Flowsheet Data    Depression screen [facility] 2/9 04/09/2019   Decreased Interest 0   Down, Depressed, Hopeless 0   PHQ - 2 Score 0   Altered sleeping 0   Tired, decreased energy 1   Change in appetite 0   Feeling bad or failure about yourself  0   Trouble concentrating 0   Moving slowly or fidgety/restless 0   Suicidal thoughts 0   PHQ-9 Score 1   Difficult doing work/chores Not difficult at all     Interpretation of Total Score  Total Score Depression Severity:  1-4 = Minimal depression, 5-9 = Mild depression, 10-14 = Moderate depression, 15-19 = Moderately severe depression, 20-27 = Severe depression   Psychosocial Evaluation and Intervention: Psychosocial Evaluation - 04/04/19 1127      Psychosocial Evaluation & Interventions   Comments  Bessie recently went into cardiac arrest which prompted the ICD placement and referral to Cardiac Rehab. His wife is very involved in his care. They are wanting him to exercise under supervision and learn more about his health issues. They reported some stress in coping with all the newness/managing and that once they get him into Cardiac Rehab they will feel a lot better    Expected Outcomes  Short: attend [facility] on a consistent basis. Long: develop self care habits    Continue Psychosocial Services   Follow up required by staff       Psychosocial Re-Evaluation:   Psychosocial Discharge (Final Psychosocial  Re-Evaluation):   Vocational Rehabilitation: Provide vocational rehab assistance to qualifying candidates.   Vocational Rehab Evaluation & Intervention: Vocational Rehab - 04/04/19 1119      Initial Vocational Rehab Evaluation & Intervention   Assessment shows need for Vocational Rehabilitation  No       Education: Education Goals: Education classes will be provided on a variety of topics geared toward better understanding of heart health and risk factor modification. Participant will state understanding/return demonstration of topics presented as noted by education test scores.  Learning Barriers/Preferences: Learning Barriers/Preferences - 04/04/19 1119      Learning Barriers/Preferences   Learning Barriers  None    Learning Preferences  None  Education Topics:  AED/CPR: - Group verbal and written instruction with the use of models to demonstrate the basic use of the AED with the basic ABC's of resuscitation.   General Nutrition Guidelines/Fats and Fiber: -Group instruction provided by verbal, written material, models and posters to present the general guidelines for heart healthy nutrition. Gives an explanation and review of dietary fats and fiber.   Controlling Sodium/Reading Food Labels: -Group verbal and written material supporting the discussion of sodium use in heart healthy nutrition. Review and explanation with models, verbal and written materials for utilization of the food label.   Exercise Physiology & General Exercise Guidelines: - Group verbal and written instruction with models to review the exercise physiology of the cardiovascular system and associated critical values. Provides general exercise guidelines with specific guidelines to those with heart or lung disease.    Aerobic Exercise & Resistance Training: - Gives group verbal and written instruction on the various components of exercise. Focuses on aerobic and resistive training programs and the  benefits of this training and how to safely progress through these programs..   Flexibility, Balance, Mind/Body Relaxation: Provides group verbal/written instruction on the benefits of flexibility and balance training, including mind/body exercise modes such as yoga, pilates and tai chi.  Demonstration and skill practice provided.   Stress and Anxiety: - Provides group verbal and written instruction about the health risks of elevated stress and causes of high stress.  Discuss the correlation between heart/lung disease and anxiety and treatment options. Review healthy ways to manage with stress and anxiety.   Depression: - Provides group verbal and written instruction on the correlation between heart/lung disease and depressed mood, treatment options, and the stigmas associated with seeking treatment.   Anatomy & Physiology of the Heart: - Group verbal and written instruction and models provide basic cardiac anatomy and physiology, with the coronary electrical and arterial systems. Review of Valvular disease and Heart Failure   Cardiac Procedures: - Group verbal and written instruction to review commonly prescribed medications for heart disease. Reviews the medication, class of the drug, and side effects. Includes the steps to properly store meds and maintain the prescription regimen. (beta blockers and nitrates)   Cardiac Medications I: - Group verbal and written instruction to review commonly prescribed medications for heart disease. Reviews the medication, class of the drug, and side effects. Includes the steps to properly store meds and maintain the prescription regimen.   Cardiac Medications II: -Group verbal and written instruction to review commonly prescribed medications for heart disease. Reviews the medication, class of the drug, and side effects. (all other drug classes)    Go Sex-Intimacy & Heart Disease, Get SMART - Goal Setting: - Group verbal and written instruction  through game format to discuss heart disease and the return to sexual intimacy. Provides group verbal and written material to discuss and apply goal setting through the application of the S.M.A.R.T. Method.   Other Matters of the Heart: - Provides group verbal, written materials and models to describe Stable Angina and Peripheral Artery. Includes description of the disease process and treatment options available to the cardiac patient.   Exercise & Equipment Safety: - Individual verbal instruction and demonstration of equipment use and safety with use of the equipment.   Cardiac Rehab from 04/09/2019 in [facility] Cardiac and Pulmonary Rehab  Date  04/09/19  Educator  AS  Instruction Review Code  1- Verbalizes Understanding      Infection Prevention: - Provides verbal and written material to  individual with discussion of infection control including proper hand washing and proper equipment cleaning during exercise session.   Cardiac Rehab from 04/09/2019 in [facility] Cardiac and Pulmonary Rehab  Date  04/09/19  Educator  AS  Instruction Review Code  1- Verbalizes Understanding      Falls Prevention: - Provides verbal and written material to individual with discussion of falls prevention and safety.   Cardiac Rehab from 04/09/2019 in [facility] Cardiac and Pulmonary Rehab  Date  04/09/19  Educator  AS  Instruction Review Code  1- Verbalizes Understanding      Diabetes: - Individual verbal and written instruction to review signs/symptoms of diabetes, desired ranges of glucose level fasting, after meals and with exercise. Acknowledge that pre and post exercise glucose checks will be done for 3 sessions at entry of program.   Cardiac Rehab from 04/04/2019 in [facility] Cardiac and Pulmonary Rehab  Date  04/04/19  Educator  [facility]  Instruction Review Code  1- Verbalizes Understanding      Know Your Numbers and Risk Factors: -Group verbal and written instruction about important numbers in your health.   Discussion of what are risk factors and how they play a role in the disease process.  Review of Cholesterol, Blood Pressure, Diabetes, and BMI and the role they play in your overall health.   Sleep Hygiene: -Provides group verbal and written instruction about how sleep can affect your health.  Define sleep hygiene, discuss sleep cycles and impact of sleep habits. Review good sleep hygiene tips.    Other: -Provides group and verbal instruction on various topics (see comments)   Knowledge Questionnaire Score: Knowledge Questionnaire Score - 04/09/19 1208      Knowledge Questionnaire Score   Pre Score  20/26       Core Components/Risk Factors/Patient Goals at Admission: Personal Goals and Risk Factors at Admission - 04/09/19 1206      Core Components/Risk Factors/Patient Goals on Admission    Weight Management  Yes    Intervention  Weight Management: Develop a combined nutrition and exercise program designed to reach desired caloric intake, while maintaining appropriate intake of nutrient and fiber, sodium and fats, and appropriate energy expenditure required for the weight goal.    Admit Weight  227 lb 12.8 oz (103.3 kg)       Core Components/Risk Factors/Patient Goals Review:    Core Components/Risk Factors/Patient Goals at Discharge (Final Review):    ITP Comments: ITP Comments    Row Name 04/04/19 1109 04/09/19 1204 04/17/19 1055       ITP Comments  Virtual Initial Orientation completed. Diagnosis can be found in [facility] 9/15. EP/RD Orientation scheduled for 9/29 at 9:30  6 MWT and nutrition consult completed.  ITP created and sent to Dr Sabra Heck for review  30 day review completed. ITP sent to Dr. Emily Filbert, Medical Director of Cardiac and Pulmonary Rehab. Continue with ITP unless changes are made by physician.  Department closed starting 10/2 until further notice by infection prevention and Health at Work teams for [place].        Comments: 30 day review

## 2019-04-23 ENCOUNTER — Encounter: Payer: Self-pay | Admitting: *Deleted

## 2019-04-23 ENCOUNTER — Telehealth: Payer: Self-pay | Admitting: *Deleted

## 2019-04-23 DIAGNOSIS — Z955 Presence of coronary angioplasty implant and graft: Secondary | ICD-10-CM

## 2019-04-23 NOTE — Telephone Encounter (Signed)
Pt left message yesterday that he would miss his appointment.  Returned his call.  He will be out for a couple of weeks due to some family obligations.

## 2019-04-30 ENCOUNTER — Encounter: Payer: Self-pay | Admitting: *Deleted

## 2019-04-30 DIAGNOSIS — Z955 Presence of coronary angioplasty implant and graft: Secondary | ICD-10-CM

## 2019-05-06 ENCOUNTER — Encounter: Payer: Medicare Other | Admitting: *Deleted

## 2019-05-06 ENCOUNTER — Other Ambulatory Visit: Payer: Self-pay

## 2019-05-06 DIAGNOSIS — Z6835 Body mass index (BMI) 35.0-35.9, adult: Secondary | ICD-10-CM | POA: Diagnosis not present

## 2019-05-06 DIAGNOSIS — E119 Type 2 diabetes mellitus without complications: Secondary | ICD-10-CM | POA: Diagnosis not present

## 2019-05-06 DIAGNOSIS — G4730 Sleep apnea, unspecified: Secondary | ICD-10-CM | POA: Diagnosis not present

## 2019-05-06 DIAGNOSIS — Z955 Presence of coronary angioplasty implant and graft: Secondary | ICD-10-CM | POA: Diagnosis not present

## 2019-05-06 DIAGNOSIS — Z794 Long term (current) use of insulin: Secondary | ICD-10-CM | POA: Diagnosis not present

## 2019-05-06 DIAGNOSIS — Z7901 Long term (current) use of anticoagulants: Secondary | ICD-10-CM | POA: Diagnosis not present

## 2019-05-06 DIAGNOSIS — Z79899 Other long term (current) drug therapy: Secondary | ICD-10-CM | POA: Diagnosis not present

## 2019-05-06 DIAGNOSIS — E785 Hyperlipidemia, unspecified: Secondary | ICD-10-CM | POA: Diagnosis not present

## 2019-05-06 DIAGNOSIS — Z8673 Personal history of transient ischemic attack (TIA), and cerebral infarction without residual deficits: Secondary | ICD-10-CM | POA: Diagnosis not present

## 2019-05-06 DIAGNOSIS — Z48812 Encounter for surgical aftercare following surgery on the circulatory system: Secondary | ICD-10-CM | POA: Diagnosis present

## 2019-05-06 DIAGNOSIS — Z7951 Long term (current) use of inhaled steroids: Secondary | ICD-10-CM | POA: Diagnosis not present

## 2019-05-06 DIAGNOSIS — I509 Heart failure, unspecified: Secondary | ICD-10-CM | POA: Diagnosis not present

## 2019-05-06 DIAGNOSIS — Z7902 Long term (current) use of antithrombotics/antiplatelets: Secondary | ICD-10-CM | POA: Diagnosis not present

## 2019-05-06 DIAGNOSIS — E669 Obesity, unspecified: Secondary | ICD-10-CM | POA: Diagnosis not present

## 2019-05-06 DIAGNOSIS — I110 Hypertensive heart disease with heart failure: Secondary | ICD-10-CM | POA: Diagnosis not present

## 2019-05-06 LAB — GLUCOSE, CAPILLARY
Glucose-Capillary: 118 mg/dL — ABNORMAL HIGH (ref 70–99)
Glucose-Capillary: 166 mg/dL — ABNORMAL HIGH (ref 70–99)

## 2019-05-06 NOTE — Progress Notes (Signed)
Daily Session Note  Patient Details  Name: [name] Ferguson MRN: [id number] Date of Birth: [birth year] Referring Provider:     Cardiac Rehab from 04/09/2019 in [facility] Cardiac and Pulmonary Rehab  Referring Provider  Lillia Corporal      Encounter Date: 05/06/2019  Check In: Session Check In - 05/06/19 0835      Check-In   Supervising physician immediately available to respond to emergencies  See telemetry face sheet for immediately available ER MD    Location  [facility]-Cardiac & Pulmonary Rehab    Staff Present  Heath Lark, RN, BSN, CCRP;Joseph Hood RCP,RRT,BSRT;Kelly [place], [place], ACSM CEP, Exercise Physiologist    Virtual Visit  No    Medication changes reported      No    Fall or balance concerns reported     No    Warm-up and Cool-down  Performed on first and last piece of equipment    Resistance Training Performed  Yes    VAD Patient?  No    PAD/SET Patient?  No      Pain Assessment   Currently in Pain?  No/denies          Social History   Tobacco Use  Smoking Status Former Smoker  . Quit date: 07/11/1981  . Years since quitting: 37.8  Smokeless Tobacco Never Used    Goals Met:  Exercise tolerated well Personal goals reviewed No report of cardiac concerns or symptoms Strength training completed today  Goals Unmet:  Not Applicable  Comments: First full day of exercise!  Patient was oriented to gym and equipment including functions, settings, policies, and procedures.  Patient's individual exercise prescription and treatment plan were reviewed.  All starting workloads were established based on the results of the 6 minute walk test done at initial orientation visit.  The plan for exercise progression was also introduced and progression will be customized based on patient's performance and goals.    Dr. Emily Filbert is Medical Director for [facility].

## 2019-05-08 ENCOUNTER — Encounter: Payer: Medicare Other | Admitting: *Deleted

## 2019-05-08 ENCOUNTER — Other Ambulatory Visit: Payer: Self-pay

## 2019-05-08 DIAGNOSIS — Z955 Presence of coronary angioplasty implant and graft: Secondary | ICD-10-CM

## 2019-05-08 DIAGNOSIS — Z48812 Encounter for surgical aftercare following surgery on the circulatory system: Secondary | ICD-10-CM | POA: Diagnosis not present

## 2019-05-08 LAB — GLUCOSE, CAPILLARY: Glucose-Capillary: 217 mg/dL — ABNORMAL HIGH (ref 70–99)

## 2019-05-08 NOTE — Progress Notes (Signed)
Discharge Progress Report  Patient Details  Name: [name] Ferguson MRN: [id number] Date of Birth: 12/29/1940 Referring Provider:     Cardiac Rehab from 04/09/2019 in [facility] Cardiac and Pulmonary Rehab  Referring Provider  [place]       Number of Visits: 3/36  Reason for Discharge:  Early Exit:  Personal  Smoking History:  Social History   Tobacco Use  Smoking Status Former Smoker  . Quit date: 07/11/1981  . Years since quitting: 37.8  Smokeless Tobacco Never Used    Diagnosis:  Status post coronary artery stent placement  ADL UCSD:   Initial Exercise Prescription: Initial Exercise Prescription - 04/09/19 1100      Date of Initial Exercise RX and Referring Provider   Date  04/09/19    Referring Provider  [place]      Recumbant Bike   Level  1    RPM  60    Minutes  15    METs  1.6      NuStep   Level  2    SPM  80    Minutes  15    METs  1.6      REL-XR   Level  2    Speed  50    Minutes  15    METs  1.6      Prescription Details   Frequency (times per week)  3    Duration  Progress to 30 minutes of continuous aerobic without signs/symptoms of physical distress      Intensity   THRR 40-80% of Max Heartrate  95-127    Ratings of Perceived Exertion  11-13    Perceived Dyspnea  0-4      Resistance Training   Training Prescription  Yes    Weight  3 lb   start if cleared by MD post defib    Reps  10-15       Discharge Exercise Prescription (Final Exercise Prescription Changes): Exercise Prescription Changes - 04/09/19 1200      Response to Exercise   Blood Pressure (Admit)  96/48    Blood Pressure (Exercise)  148/64    Blood Pressure (Exit)  108/54    Heart Rate (Admit)  64 bpm    Heart Rate (Exercise)  101 bpm    Heart Rate (Exit)  82 bpm    Oxygen Saturation (Admit)  98 %    Oxygen Saturation (Exercise)  98 %    Rating of Perceived Exertion (Exercise)  11    Perceived Dyspnea (Exercise)  1       Functional Capacity: 6 Minute Walk    Row  Name 04/09/19 1156         6 Minute Walk   Phase  Initial     Distance  965 feet     Walk Time  6 minutes     # of Rest Breaks  0     MPH  1.83     METS  1.6     RPE  11     Perceived Dyspnea   1     VO2 Peak  5.6     Symptoms  No     Resting HR  64 bpm     Resting BP  96/48     Resting Oxygen Saturation   98 %     Exercise Oxygen Saturation  during 6 min walk  98 %     Max Ex. HR  101 bpm  Max Ex. BP  148/64     2 Minute Post BP  108/54        Psychological, QOL, Others - Outcomes: PHQ 2/9: Depression screen PHQ 2/9 04/09/2019  Decreased Interest 0  Down, Depressed, Hopeless 0  PHQ - 2 Score 0  Altered sleeping 0  Tired, decreased energy 1  Change in appetite 0  Feeling bad or failure about yourself  0  Trouble concentrating 0  Moving slowly or fidgety/restless 0  Suicidal thoughts 0  PHQ-9 Score 1  Difficult doing work/chores Not difficult at all    Quality of Life: Quality of Life - 04/09/19 1207      Quality of Life   Select  Quality of Life      Quality of Life Scores   Health/Function Pre  20.8 %    Socioeconomic Pre  30 %    Psych/Spiritual Pre  30 %    Family Pre  25.2 %    GLOBAL Pre  25.37 %       Personal Goals: Goals established at orientation with interventions provided to work toward goal. Personal Goals and Risk Factors at Admission - 04/09/19 1206      Core Components/Risk Factors/Patient Goals on Admission    Weight Management  Yes    Intervention  Weight Management: Develop a combined nutrition and exercise program designed to reach desired caloric intake, while maintaining appropriate intake of nutrient and fiber, sodium and fats, and appropriate energy expenditure required for the weight goal.    Admit Weight  227 lb 12.8 oz (103.3 kg)        Personal Goals Discharge:   Exercise Goals and Review: Exercise Goals    Row Name 04/09/19 1203             Exercise Goals   Increase Physical Activity  Yes       Intervention   Provide advice, education, support and counseling about physical activity/exercise needs.;Develop an individualized exercise prescription for aerobic and resistive training based on initial evaluation findings, risk stratification, comorbidities and participant's personal goals.       Expected Outcomes  Short Term: Attend rehab on a regular basis to increase amount of physical activity.;Long Term: Add in home exercise to make exercise part of routine and to increase amount of physical activity.;Long Term: Exercising regularly at least 3-5 days a week.       Increase Strength and Stamina  Yes       Intervention  Provide advice, education, support and counseling about physical activity/exercise needs.;Develop an individualized exercise prescription for aerobic and resistive training based on initial evaluation findings, risk stratification, comorbidities and participant's personal goals.       Expected Outcomes  Short Term: Increase workloads from initial exercise prescription for resistance, speed, and METs.;Short Term: Perform resistance training exercises routinely during rehab and add in resistance training at home;Long Term: Improve cardiorespiratory fitness, muscular endurance and strength as measured by increased METs and functional capacity (6MWT)       Able to understand and use rate of perceived exertion (RPE) scale  Yes       Intervention  Provide education and explanation on how to use RPE scale       Expected Outcomes  Short Term: Able to use RPE daily in rehab to express subjective intensity level;Long Term:  Able to use RPE to guide intensity level when exercising independently       Able to understand and use Dyspnea scale  Yes       Intervention  Provide education and explanation on how to use Dyspnea scale       Expected Outcomes  Short Term: Able to use Dyspnea scale daily in rehab to express subjective sense of shortness of breath during exertion;Long Term: Able to use Dyspnea scale to  guide intensity level when exercising independently       Knowledge and understanding of Target Heart Rate Range (THRR)  Yes       Intervention  Provide education and explanation of THRR including how the numbers were predicted and where they are located for reference       Expected Outcomes  Short Term: Able to state/look up THRR;Short Term: Able to use daily as guideline for intensity in rehab;Long Term: Able to use THRR to govern intensity when exercising independently       Able to check pulse independently  Yes       Intervention  Provide education and demonstration on how to check pulse in carotid and radial arteries.;Review the importance of being able to check your own pulse for safety during independent exercise       Expected Outcomes  Short Term: Able to explain why pulse checking is important during independent exercise;Long Term: Able to check pulse independently and accurately       Understanding of Exercise Prescription  Yes       Intervention  Provide education, explanation, and written materials on patient's individual exercise prescription       Expected Outcomes  Short Term: Able to explain program exercise prescription;Long Term: Able to explain home exercise prescription to exercise independently          Exercise Goals Re-Evaluation: Exercise Goals Re-Evaluation    Row Name 04/30/19 [id number] 05/06/19 0836           Exercise Goal Re-Evaluation   Exercise Goals Review  -  Able to understand and use rate of perceived exertion (RPE) scale;Knowledge and understanding of Target Heart Rate Range (THRR);Understanding of Exercise Prescription      Comments  Has yet to start full day of exercise  Reviewed RPE scale, THR and program prescription with pt today.  Pt voiced understanding and was given a copy of goals to take home.      Expected Outcomes  -  Short: Use RPE daily to regulate intensity. Long: Follow program prescription in THR.         Nutrition & Weight - Outcomes: Pre  Biometrics - 04/09/19 1203      Pre Biometrics   Height  5\' 7"  (1.702 m)    Weight  227 lb 12.8 oz (103.3 kg)    BMI (Calculated)  35.67    Single Leg Stand  2.95 seconds        Nutrition: Nutrition Therapy & Goals - 04/09/19 1042      Nutrition Therapy   Diet  Low Na, HH diet    Protein (specify units)  82g    Fiber  30 grams    Whole Grain Foods  3 servings    Saturated Fats  12 max. grams    Fruits and Vegetables  5 servings/day    Sodium  1.5 grams      Personal Nutrition Goals   Nutrition Goal  ST: no goal LT: live longer    Comments  Pt reports that his diet is "erratic" eating breakfast later and dinner earlier, pt reports that he lost 10 pounds and tries to monitor  his portions. PT reports monitoring Na and not using any in his cooking. Pt reports limiting cured meats but will have some bacon with his eggs. Discussed HH eating and DM friendly eating - pt reports that he does not want to make goals and that he may not be here for another 30 days to make new goals as he exercises at home and doesn't think this will be valuble.      Intervention Plan   Intervention  Prescribe, educate and counsel regarding individualized specific dietary modifications aiming towards targeted core components such as weight, hypertension, lipid management, diabetes, heart failure and other comorbidities.;Nutrition handout(s) given to patient.    Expected Outcomes  Short Term Goal: Understand basic principles of dietary content, such as calories, fat, sodium, cholesterol and nutrients.;Short Term Goal: A plan has been developed with personal nutrition goals set during dietitian appointment.;Long Term Goal: Adherence to prescribed nutrition plan.       Nutrition Discharge:   Education Questionnaire Score: Knowledge Questionnaire Score - 04/09/19 1208      Knowledge Questionnaire Score   Pre Score  20/26       Goals reviewed with patient; copy given to patient.

## 2019-05-08 NOTE — Progress Notes (Signed)
Daily Session Note  Patient Details  Name: [name] Ferguson MRN: [id number] Date of Birth: 01/15/1941 Referring Provider:     Cardiac Rehab from 04/09/2019 in [facility] Cardiac and Pulmonary Rehab  Referring Provider  [name] Ferguson      Encounter Date: 05/08/2019  Check In: Session Check In - 05/08/19 0926      Check-In   Supervising physician immediately available to respond to emergencies  See telemetry face sheet for immediately available ER MD    Location  [facility]-Cardiac & Pulmonary Rehab    Staff Present  [name] Lark, RN, BSN, CCRP;[name] Ferguson BS, Exercise Physiologist;[name] Ferguson RCP,RRT,BSRT    Virtual Visit  No    Medication changes reported      No    Fall or balance concerns reported     No    Warm-up and Cool-down  Performed on first and last piece of equipment    Resistance Training Performed  Yes    VAD Patient?  No    PAD/SET Patient?  No      Pain Assessment   Currently in Pain?  No/denies          Social History   Tobacco Use  Smoking Status Former Smoker  . Quit date: 07/11/1981  . Years since quitting: 37.8  Smokeless Tobacco Never Used    Goals Met:  Independence with exercise equipment Exercise tolerated well No report of cardiac concerns or symptoms  Goals Unmet:  Not Applicable  Comments: Pt able to follow exercise prescription today without complaint.  Will continue to monitor for progression.   [name] Ferguson graduated today from  rehab with 3/36sessions completed.  Details of the patient's exercise prescription and what He needs to do in order to continue the prescription and progress were discussed with patient.  Patient was given a copy of prescription and goals at admission  Patient verbalized understanding.  [name] Ferguson plans to continue to exercise by exercising at home Goes to [employer] with his Wife.  Wearing a mask and the daily screening were barriers for [name] Ferguson   Dr. Emily Filbert is Medical Director for [place] and  [facility].

## 2019-05-08 NOTE — Progress Notes (Addendum)
Cardiac Individual Treatment Plan  Patient Details  Name: [name] Ferguson MRN: [id number] Date of Birth: Apr 04, 1941 Referring Provider:     Cardiac Rehab from 04/09/2019 in [facility] Cardiac and Pulmonary Rehab  Referring Provider  [place]      Initial Encounter Date:    Cardiac Rehab from 04/09/2019 in [facility] Cardiac and Pulmonary Rehab  Date  04/09/19      Visit Diagnosis: Status post coronary artery stent placement  Patient's Home Medications on Admission:  Current Outpatient Medications:  .  acyclovir (ZOVIRAX) 400 MG tablet, TK 1 T PO TID FOR 5 DAYS FOR EACH FEVER BLISTER OUTBREAK, Disp: , Rfl:  .  amiodarone (PACERONE) 400 MG tablet, , Disp: , Rfl:  .  amoxicillin (AMOXIL) 500 MG capsule, Before dental procedures, Disp: , Rfl:  .  aspirin 81 MG tablet, Take 81 mg by mouth daily., Disp: , Rfl:  .  Besifloxacin HCl (BESIVANCE) 0.6 % SUSP, Apply to eye., Disp: , Rfl:  .  Blood Glucose Monitoring Suppl (ONETOUCH VERIO IQ SYSTEM) w/Device KIT, U UTD TO CHECK BS QID, Disp: , Rfl:  .  Brinzolamide-Brimonidine (SIMBRINZA) 1-0.2 % SUSP, Apply 1 drop to eye 2 (two) times daily., Disp: , Rfl:  .  clopidogrel (PLAVIX) 75 MG tablet, 1 tab by mouth daily, Disp: , Rfl:  .  dorzolamide (TRUSOPT) 2 % ophthalmic solution, Administer 2 drops to both eyes Two (2) times a day., Disp: , Rfl:  .  ELIQUIS 5 MG TABS tablet, , Disp: , Rfl:  .  ezetimibe (ZETIA) 10 MG tablet, , Disp: , Rfl:  .  FLUAD 0.5 ML SUSY, TO BE ADMINISTERED BY PHARMACIST FOR IMMUNIZATION, Disp: , Rfl: 0 .  fluticasone (FLONASE) 50 MCG/ACT nasal spray, U 1 SPR NASALLY BID, Disp: , Rfl:  .  Glucosamine HCl 1500 MG TABS, , Disp: , Rfl:  .  glucosamine-chondroitin 500-400 MG tablet, Take 1 tablet by mouth daily., Disp: , Rfl:  .  glucose blood (ONETOUCH VERIO) test strip, TEST 3 TIMES DAILY, Disp: , Rfl:  .  Insulin Detemir (LEVEMIR FLEXTOUCH) 100 UNIT/ML Pen, Inject 35-40 Units into the skin at bedtime. Says 50 units is prescribed but will  usually take 35-40 at night, Disp: , Rfl:  .  insulin lispro (HUMALOG) 100 UNIT/ML KiwkPen, 3 (three) times daily with meals. Sliding Scale., Disp: , Rfl:  .  Insulin Pen Needle (PEN NEEDLES 31GX5/16") 31G X 8 MM MISC, as directed, Disp: , Rfl:  .  INVOKANA 100 MG TABS tablet, , Disp: , Rfl:  .  isosorbide mononitrate (IMDUR) 30 MG 24 hr tablet, Take 30 mg by mouth daily. , Disp: , Rfl:  .  KLOR-CON M20 20 MEQ tablet, Take 20 mEq by mouth 2 (two) times daily., Disp: , Rfl: 2 .  LANTUS SOLOSTAR 100 UNIT/ML Solostar Pen, INJECT 45 UNITS UNDER SKIN ONCE DAILY, Disp: , Rfl: 0 .  latanoprost (XALATAN) 0.005 % ophthalmic solution, , Disp: , Rfl:  .  lisinopril (PRINIVIL,ZESTRIL) 2.5 MG tablet, Take 2.5 mg by mouth daily., Disp: , Rfl:  .  metFORMIN (GLUCOPHAGE) 1000 MG tablet, 1,000 mg 2 (two) times daily with a meal. , Disp: , Rfl:  .  metoprolol succinate (TOPROL-XL) 25 MG 24 hr tablet, TAKE 1/2 TABLET BY MOUTH EVERY DAY, Disp: , Rfl:  .  Multiple Vitamins-Minerals (MULTIVITAMIN ADULT PO), Take by mouth., Disp: , Rfl:  .  nitroGLYCERIN (NITROSTAT) 0.4 MG SL tablet, PLACE 1 TABLET UNDER THE TONGUE EVERY 5 MINUTES  AS NEEDED, Disp: , Rfl:  .  Omega-3 Fatty Acids (FISH OIL) 1000 MG CAPS, Take 4 capsules by mouth 2 (two) times daily. 4 capsules in morning and one at night, Disp: , Rfl:  .  Omega-3 Fatty Acids (OMEGA-3 FISH OIL PO), Take by mouth., Disp: , Rfl:  .  ONETOUCH DELICA LANCETS 61W MISC, USE 1 LANCET 3 TIMES A DAY, Disp: , Rfl:  .  oxyCODONE (OXY IR/ROXICODONE) 5 MG immediate release tablet, TAKE ONE TABLET BY MOUTH EVERY 4 HOURS AS NEEDED FOR UP TO 5 DAYS, Disp: , Rfl:  .  pravastatin (PRAVACHOL) 40 MG tablet, TAKE 1 TABLET BY MOUTH EVERY EVENING, Disp: , Rfl:  .  prednisoLONE acetate (PRED FORTE) 1 % ophthalmic suspension, PLACE 1 DROP INTO THE RIGHT EYE 6 (SIX) TIMES DAILY, Disp: , Rfl:  .  tamsulosin (FLOMAX) 0.4 MG CAPS capsule, Take 1 capsule (0.4 mg total) by mouth daily., Disp: 90  capsule, Rfl: 3 .  torsemide (DEMADEX) 20 MG tablet, TAKE 2 TABLETS BY MOUTH TWICE A DAY FOR 1 WEEK, THEN 2 IN THE MORNING AND 1 IN THE EVENING, Disp: , Rfl:  .  TRADJENTA 5 MG TABS tablet, , Disp: , Rfl:  .  triamcinolone cream (KENALOG) 0.1 %, Apply topically., Disp: , Rfl:  .  UNABLE TO FIND, Take by mouth., Disp: , Rfl:  .  vitamin B-12 (CYANOCOBALAMIN) 500 MCG tablet, Take 500 mcg by mouth daily., Disp: , Rfl:  .  vitamin C (ASCORBIC ACID) 500 MG tablet, Take 500 mg by mouth daily., Disp: , Rfl:   Past Medical History: Past Medical History:  Diagnosis Date  . Angina pectoris ([place])   . 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 BPH (benign prostatic hypertrophy) with urinary obstruction   . CHF (congestive heart failure) ([place])   . Diabetes mellitus without complication ([place])   . HLD (hyperlipidemia)   . Hypertension   . Obesity   . Penile adhesions   . Sleep apnea   . SOB (shortness of breath)   . Stroke [facility])     Tobacco Use: Social History   Tobacco Use  Smoking Status Former Smoker  . Quit date: 07/11/1981  . Years since quitting: 37.8  Smokeless Tobacco Never Used    Labs: Recent Review Flowsheet Data    There is no flowsheet data to display.       Exercise Target Goals: Exercise Program Goal: Individual exercise prescription set using results from initial 6 min walk test and THRR while considering  patient's activity barriers and safety.   Exercise Prescription Goal: Initial exercise prescription builds to 30-45 minutes a day of aerobic activity, 2-3 days per week.  Home exercise guidelines will be given to patient during program as part of exercise prescription that the participant will acknowledge.  Activity Barriers & Risk Stratification:   6 Minute Walk: 6 Minute Walk    Row Name 04/09/19 1156         6 Minute Walk   Phase  Initial     Distance  965 feet     Walk Time  6 minutes     # of Rest Breaks  0     MPH  1.83     METS  1.6      RPE  11     Perceived Dyspnea   1     VO2 Peak  5.6     Symptoms  No     Resting HR  64 bpm     Resting BP  96/48     Resting Oxygen Saturation   98 %     Exercise Oxygen Saturation  during 6 min walk  98 %     Max Ex. HR  101 bpm     Max Ex. BP  148/64     2 Minute Post BP  108/54        Oxygen Initial Assessment:   Oxygen Re-Evaluation:   Oxygen Discharge (Final Oxygen Re-Evaluation):   Initial Exercise Prescription: Initial Exercise Prescription - 04/09/19 1100      Date of Initial Exercise RX and Referring Provider   Date  04/09/19    Referring Provider  [place]      Recumbant Bike   Level  1    RPM  60    Minutes  15    METs  1.6      NuStep   Level  2    SPM  80    Minutes  15    METs  1.6      REL-XR   Level  2    Speed  50    Minutes  15    METs  1.6      Prescription Details   Frequency (times per week)  3    Duration  Progress to 30 minutes of continuous aerobic without signs/symptoms of physical distress      Intensity   THRR 40-80% of Max Heartrate  95-127    Ratings of Perceived Exertion  11-13    Perceived Dyspnea  0-4      Resistance Training   Training Prescription  Yes    Weight  3 lb   start if cleared by MD post defib    Reps  10-15       Perform Capillary Blood Glucose checks as needed.  Exercise Prescription Changes: Exercise Prescription Changes    Row Name 04/09/19 1200             Response to Exercise   Blood Pressure (Admit)  96/48       Blood Pressure (Exercise)  148/64       Blood Pressure (Exit)  108/54       Heart Rate (Admit)  64 bpm       Heart Rate (Exercise)  101 bpm       Heart Rate (Exit)  82 bpm       Oxygen Saturation (Admit)  98 %       Oxygen Saturation (Exercise)  98 %       Rating of Perceived Exertion (Exercise)  11       Perceived Dyspnea (Exercise)  1          Exercise Comments: Exercise Comments    Row Name 05/06/19 0836 05/08/19 0951         Exercise Comments  First full day of  exercise!  Patient was oriented to gym and equipment including functions, settings, policies, and procedures.  Patient's individual exercise prescription and treatment plan were reviewed.  All starting workloads were established based on the results of the 6 minute walk test done at initial orientation visit.  The plan for exercise progression was also introduced and progression will be customized based on patient's performance and goals.  Casin graduated today from  rehab with 3/36sessions completed.  Details of the patient's exercise prescription and what He needs to do in order to continue the prescription and progress were discussed  with patient.  Patient was given a copy of prescription and goals at admission  Patient verbalized understanding.  Randell plans to continue to exercise by exercising at home Goes to [employer] with his Wife.  Wearing a mask and the daily screening were barriers for Iona Beard         Exercise Goals and Review: Exercise Goals    Row Name 04/09/19 1203             Exercise Goals   Increase Physical Activity  Yes       Intervention  Provide advice, education, support and counseling about physical activity/exercise needs.;Develop an individualized exercise prescription for aerobic and resistive training based on initial evaluation findings, risk stratification, comorbidities and participant's personal goals.       Expected Outcomes  Short Term: Attend rehab on a regular basis to increase amount of physical activity.;Long Term: Add in home exercise to make exercise part of routine and to increase amount of physical activity.;Long Term: Exercising regularly at least 3-5 days a week.       Increase Strength and Stamina  Yes       Intervention  Provide advice, education, support and counseling about physical activity/exercise needs.;Develop an individualized exercise prescription for aerobic and resistive training based on initial evaluation findings, risk  stratification, comorbidities and participant's personal goals.       Expected Outcomes  Short Term: Increase workloads from initial exercise prescription for resistance, speed, and METs.;Short Term: Perform resistance training exercises routinely during rehab and add in resistance training at home;Long Term: Improve cardiorespiratory fitness, muscular endurance and strength as measured by increased METs and functional capacity (6MWT)       Able to understand and use rate of perceived exertion (RPE) scale  Yes       Intervention  Provide education and explanation on how to use RPE scale       Expected Outcomes  Short Term: Able to use RPE daily in rehab to express subjective intensity level;Long Term:  Able to use RPE to guide intensity level when exercising independently       Able to understand and use Dyspnea scale  Yes       Intervention  Provide education and explanation on how to use Dyspnea scale       Expected Outcomes  Short Term: Able to use Dyspnea scale daily in rehab to express subjective sense of shortness of breath during exertion;Long Term: Able to use Dyspnea scale to guide intensity level when exercising independently       Knowledge and understanding of Target Heart Rate Range (THRR)  Yes       Intervention  Provide education and explanation of THRR including how the numbers were predicted and where they are located for reference       Expected Outcomes  Short Term: Able to state/look up THRR;Short Term: Able to use daily as guideline for intensity in rehab;Long Term: Able to use THRR to govern intensity when exercising independently       Able to check pulse independently  Yes       Intervention  Provide education and demonstration on how to check pulse in carotid and radial arteries.;Review the importance of being able to check your own pulse for safety during independent exercise       Expected Outcomes  Short Term: Able to explain why pulse checking is important during independent  exercise;Long Term: Able to check pulse independently and accurately  Understanding of Exercise Prescription  Yes       Intervention  Provide education, explanation, and written materials on patient's individual exercise prescription       Expected Outcomes  Short Term: Able to explain program exercise prescription;Long Term: Able to explain home exercise prescription to exercise independently          Exercise Goals Re-Evaluation : Exercise Goals Re-Evaluation    Row Name 04/30/19 [phone] 05/06/19 0836           Exercise Goal Re-Evaluation   Exercise Goals Review  -  Able to understand and use rate of perceived exertion (RPE) scale;Knowledge and understanding of Target Heart Rate Range (THRR);Understanding of Exercise Prescription      Comments  Has yet to start full day of exercise  Reviewed RPE scale, THR and program prescription with pt today.  Pt voiced understanding and was given a copy of goals to take home.      Expected Outcomes  -  Short: Use RPE daily to regulate intensity. Long: Follow program prescription in THR.         Discharge Exercise Prescription (Final Exercise Prescription Changes): Exercise Prescription Changes - 04/09/19 1200      Response to Exercise   Blood Pressure (Admit)  96/48    Blood Pressure (Exercise)  148/64    Blood Pressure (Exit)  108/54    Heart Rate (Admit)  64 bpm    Heart Rate (Exercise)  101 bpm    Heart Rate (Exit)  82 bpm    Oxygen Saturation (Admit)  98 %    Oxygen Saturation (Exercise)  98 %    Rating of Perceived Exertion (Exercise)  11    Perceived Dyspnea (Exercise)  1       Nutrition:  Target Goals: Understanding of nutrition guidelines, daily intake of sodium <1531m, cholesterol <202m calories 30% from fat and 7% or less from saturated fats, daily to have 5 or more servings of fruits and vegetables.  Biometrics: Pre Biometrics - 04/09/19 1203      Pre Biometrics   Height  _0  (1.702 m)    Weight  227 lb 12.8 oz  (103.3 kg)    BMI (Calculated)  35.67    Single Leg Stand  2.95 seconds        Nutrition Therapy Plan and Nutrition Goals: Nutrition Therapy & Goals - 04/09/19 1042      Nutrition Therapy   Diet  Low Na, HH diet    Protein (specify units)  82g    Fiber  30 grams    Whole Grain Foods  3 servings    Saturated Fats  12 max. grams    Fruits and Vegetables  5 servings/day    Sodium  1.5 grams      Personal Nutrition Goals   Nutrition Goal  ST: no goal LT: live longer    Comments  Pt reports that his diet is "erratic" eating breakfast later and dinner earlier, pt reports that he lost 10 pounds and tries to monitor his portions. PT reports monitoring Na and not using any in his cooking. Pt reports limiting cured meats but will have some bacon with his eggs. Discussed HH eating and DM friendly eating - pt reports that he does not want to make goals and that he may not be here for another 30 days to make new goals as he exercises at home and doesn't think this will be valuble.      Intervention  Plan   Intervention  Prescribe, educate and counsel regarding individualized specific dietary modifications aiming towards targeted core components such as weight, hypertension, lipid management, diabetes, heart failure and other comorbidities.;Nutrition handout(s) given to patient.    Expected Outcomes  Short Term Goal: Understand basic principles of dietary content, such as calories, fat, sodium, cholesterol and nutrients.;Short Term Goal: A plan has been developed with personal nutrition goals set during dietitian appointment.;Long Term Goal: Adherence to prescribed nutrition plan.       Nutrition Assessments:   Nutrition Goals Re-Evaluation:   Nutrition Goals Discharge (Final Nutrition Goals Re-Evaluation):   Psychosocial: Target Goals: Acknowledge presence or absence of significant depression and/or stress, maximize coping skills, provide positive support system. Participant is able to  verbalize types and ability to use techniques and skills needed for reducing stress and depression.   Initial Review & Psychosocial Screening: Initial Psych Review & Screening - 04/04/19 1119      Initial Review   Current issues with  Current Stress Concerns    Comments  Stevens recently went into cardiac arrest which prompted the ICD placement and referral to Cardiac Rehab. His wife is very involved in his care. They are wanting him to exercise under supervision and learn more about his health issues. They reported some stress in coping with all the newness/managing and that once they get him into Cardiac Rehab they will feel a lot better      [place]?  Yes      Screening Interventions   Interventions  Encouraged to exercise;To provide support and resources with identified psychosocial needs;Provide feedback about the scores to participant    Expected Outcomes  Short Term goal: Utilizing psychosocial counselor, staff and physician to assist with identification of specific Stressors or current issues interfering with healing process. Setting desired goal for each stressor or current issue identified.;Long Term Goal: Stressors or current issues are controlled or eliminated.;Short Term goal: Identification and review with participant of any Quality of Life or Depression concerns found by scoring the questionnaire.;Long Term goal: The participant improves quality of Life and PHQ9 Scores as seen by post scores and/or verbalization of changes       Quality of Life Scores:  Quality of Life - 04/09/19 1207      Quality of Life   Select  Quality of Life      Quality of Life Scores   Health/Function Pre  20.8 %    Socioeconomic Pre  30 %    Psych/Spiritual Pre  30 %    Family Pre  25.2 %    GLOBAL Pre  25.37 %      Scores of 19 and below usually indicate a poorer quality of life in these areas.  A difference of  2-3 points is a clinically meaningful difference.  A  difference of 2-3 points in the total score of the Quality of Life Index has been associated with significant improvement in overall quality of life, self-image, physical symptoms, and general health in studies assessing change in quality of life.  PHQ-9: Recent Review Flowsheet Data    Depression screen [facility] 2/9 04/09/2019   Decreased Interest 0   Down, Depressed, Hopeless 0   PHQ - 2 Score 0   Altered sleeping 0   Tired, decreased energy 1   Change in appetite 0   Feeling bad or failure about yourself  0   Trouble concentrating 0   Moving slowly or fidgety/restless 0  Suicidal thoughts 0   PHQ-9 Score 1   Difficult doing work/chores Not difficult at all     Interpretation of Total Score  Total Score Depression Severity:  1-4 = Minimal depression, 5-9 = Mild depression, 10-14 = Moderate depression, 15-19 = Moderately severe depression, 20-27 = Severe depression   Psychosocial Evaluation and Intervention: Psychosocial Evaluation - 04/04/19 1127      Psychosocial Evaluation & Interventions   Comments  Maveric recently went into cardiac arrest which prompted the ICD placement and referral to Cardiac Rehab. His wife is very involved in his care. They are wanting him to exercise under supervision and learn more about his health issues. They reported some stress in coping with all the newness/managing and that once they get him into Cardiac Rehab they will feel a lot better    Expected Outcomes  Short: attend [facility] on a consistent basis. Long: develop self care habits    Continue Psychosocial Services   Follow up required by staff       Psychosocial Re-Evaluation:   Psychosocial Discharge (Final Psychosocial Re-Evaluation):   Vocational Rehabilitation: Provide vocational rehab assistance to qualifying candidates.   Vocational Rehab Evaluation & Intervention: Vocational Rehab - 04/04/19 1119      Initial Vocational Rehab Evaluation & Intervention   Assessment shows need for  Vocational Rehabilitation  No       Education: Education Goals: Education classes will be provided on a variety of topics geared toward better understanding of heart health and risk factor modification. Participant will state understanding/return demonstration of topics presented as noted by education test scores.  Learning Barriers/Preferences: Learning Barriers/Preferences - 04/04/19 1119      Learning Barriers/Preferences   Learning Barriers  None    Learning Preferences  None       Education Topics:  AED/CPR: - Group verbal and written instruction with the use of models to demonstrate the basic use of the AED with the basic ABC's of resuscitation.   General Nutrition Guidelines/Fats and Fiber: -Group instruction provided by verbal, written material, models and posters to present the general guidelines for heart healthy nutrition. Gives an explanation and review of dietary fats and fiber.   Controlling Sodium/Reading Food Labels: -Group verbal and written material supporting the discussion of sodium use in heart healthy nutrition. Review and explanation with models, verbal and written materials for utilization of the food label.   Exercise Physiology & General Exercise Guidelines: - Group verbal and written instruction with models to review the exercise physiology of the cardiovascular system and associated critical values. Provides general exercise guidelines with specific guidelines to those with heart or lung disease.    Aerobic Exercise & Resistance Training: - Gives group verbal and written instruction on the various components of exercise. Focuses on aerobic and resistive training programs and the benefits of this training and how to safely progress through these programs..   Flexibility, Balance, Mind/Body Relaxation: Provides group verbal/written instruction on the benefits of flexibility and balance training, including mind/body exercise modes such as yoga, pilates  and tai chi.  Demonstration and skill practice provided.   Stress and Anxiety: - Provides group verbal and written instruction about the health risks of elevated stress and causes of high stress.  Discuss the correlation between heart/lung disease and anxiety and treatment options. Review healthy ways to manage with stress and anxiety.   Depression: - Provides group verbal and written instruction on the correlation between heart/lung disease and depressed mood, treatment options, and the stigmas  associated with seeking treatment.   Anatomy & Physiology of the Heart: - Group verbal and written instruction and models provide basic cardiac anatomy and physiology, with the coronary electrical and arterial systems. Review of Valvular disease and Heart Failure   Cardiac Procedures: - Group verbal and written instruction to review commonly prescribed medications for heart disease. Reviews the medication, class of the drug, and side effects. Includes the steps to properly store meds and maintain the prescription regimen. (beta blockers and nitrates)   Cardiac Medications I: - Group verbal and written instruction to review commonly prescribed medications for heart disease. Reviews the medication, class of the drug, and side effects. Includes the steps to properly store meds and maintain the prescription regimen.   Cardiac Medications II: -Group verbal and written instruction to review commonly prescribed medications for heart disease. Reviews the medication, class of the drug, and side effects. (all other drug classes)    Go Sex-Intimacy & Heart Disease, Get SMART - Goal Setting: - Group verbal and written instruction through game format to discuss heart disease and the return to sexual intimacy. Provides group verbal and written material to discuss and apply goal setting through the application of the S.M.A.R.T. Method.   Other Matters of the Heart: - Provides group verbal, written materials  and models to describe Stable Angina and Peripheral Artery. Includes description of the disease process and treatment options available to the cardiac patient.   Exercise & Equipment Safety: - Individual verbal instruction and demonstration of equipment use and safety with use of the equipment.   Cardiac Rehab from 04/09/2019 in [facility] Cardiac and Pulmonary Rehab  Date  04/09/19  Educator  AS  Instruction Review Code  1- Verbalizes Understanding      Infection Prevention: - Provides verbal and written material to individual with discussion of infection control including proper hand washing and proper equipment cleaning during exercise session.   Cardiac Rehab from 04/09/2019 in [facility] Cardiac and Pulmonary Rehab  Date  04/09/19  Educator  AS  Instruction Review Code  1- Verbalizes Understanding      Falls Prevention: - Provides verbal and written material to individual with discussion of falls prevention and safety.   Cardiac Rehab from 04/09/2019 in [facility] Cardiac and Pulmonary Rehab  Date  04/09/19  Educator  AS  Instruction Review Code  1- Verbalizes Understanding      Diabetes: - Individual verbal and written instruction to review signs/symptoms of diabetes, desired ranges of glucose level fasting, after meals and with exercise. Acknowledge that pre and post exercise glucose checks will be done for 3 sessions at entry of program.   Cardiac Rehab from 04/04/2019 in [facility] Cardiac and Pulmonary Rehab  Date  04/04/19  Educator  [facility]  Instruction Review Code  1- Verbalizes Understanding      Know Your Numbers and Risk Factors: -Group verbal and written instruction about important numbers in your health.  Discussion of what are risk factors and how they play a role in the disease process.  Review of Cholesterol, Blood Pressure, Diabetes, and BMI and the role they play in your overall health.   Sleep Hygiene: -Provides group verbal and written instruction about how sleep can affect your  health.  Define sleep hygiene, discuss sleep cycles and impact of sleep habits. Review good sleep hygiene tips.    Other: -Provides group and verbal instruction on various topics (see comments)   Knowledge Questionnaire Score: Knowledge Questionnaire Score - 04/09/19 1208  Knowledge Questionnaire Score   Pre Score  20/26       Core Components/Risk Factors/Patient Goals at Admission: Personal Goals and Risk Factors at Admission - 04/09/19 1206      Core Components/Risk Factors/Patient Goals on Admission    Weight Management  Yes    Intervention  Weight Management: Develop a combined nutrition and exercise program designed to reach desired caloric intake, while maintaining appropriate intake of nutrient and fiber, sodium and fats, and appropriate energy expenditure required for the weight goal.    Admit Weight  227 lb 12.8 oz (103.3 kg)       Core Components/Risk Factors/Patient Goals Review:    Core Components/Risk Factors/Patient Goals at Discharge (Final Review):    ITP Comments: ITP Comments    Row Name 04/04/19 1109 04/09/19 1204 04/17/19 1055 04/23/19 1316 04/30/19 0917   ITP Comments  Virtual Initial Orientation completed. Diagnosis can be found in [facility] 9/15. EP/RD Orientation scheduled for 9/29 at 9:30  6 MWT and nutrition consult completed.  ITP created and sent to Dr Sabra Heck for review  30 day review completed. ITP sent to Dr. Emily Filbert, Medical Director of Cardiac and Pulmonary Rehab. Continue with ITP unless changes are made by physician.  Department closed starting 10/2 until further notice by infection prevention and Health at Work teams for [place].  Pt left message yesterday that he would miss his appointment.  Returned his call.  He will be out for a couple of weeks due to some family obligations.  Pt still out watching grandson in [place].   [place] Name 05/06/19 [phone] 05/08/19 0951         ITP Comments  First full day of exercise!  Patient was oriented to gym  and equipment including functions, settings, policies, and procedures.  Patient's individual exercise prescription and treatment plan were reviewed.  All starting workloads were established based on the results of the 6 minute walk test done at initial orientation visit.  The plan for exercise progression was also introduced and progression will be customized based on patient's performance and goals.  Deklin graduated today from  rehab with 3/36sessions completed.  Details of the patient's exercise prescription and what He needs to do in order to continue the prescription and progress were discussed with patient.  Patient was given a copy of prescription and goals at admission  Patient verbalized understanding.  Rainey plans to continue to exercise by exercising at home Goes to [employer] with his Wife.  Wearing a mask and the daily screening were barriers for [place]         Comments: discharged

## 2019-06-10 ENCOUNTER — Ambulatory Visit (INDEPENDENT_AMBULATORY_CARE_PROVIDER_SITE_OTHER): Payer: Medicare Other | Admitting: Podiatry

## 2019-06-10 ENCOUNTER — Encounter: Payer: Self-pay | Admitting: Podiatry

## 2019-06-10 ENCOUNTER — Other Ambulatory Visit: Payer: Self-pay

## 2019-06-10 DIAGNOSIS — D689 Coagulation defect, unspecified: Secondary | ICD-10-CM | POA: Diagnosis not present

## 2019-06-10 DIAGNOSIS — B351 Tinea unguium: Secondary | ICD-10-CM | POA: Diagnosis not present

## 2019-06-10 DIAGNOSIS — M79674 Pain in right toe(s): Secondary | ICD-10-CM

## 2019-06-10 DIAGNOSIS — M79675 Pain in left toe(s): Secondary | ICD-10-CM | POA: Diagnosis not present

## 2019-06-10 DIAGNOSIS — E1142 Type 2 diabetes mellitus with diabetic polyneuropathy: Secondary | ICD-10-CM

## 2019-06-10 NOTE — Progress Notes (Signed)
Complaint:  Visit Type: Patient returns to my office for continued preventative foot care services. Complaint: Patient states" my nails have grown long and thick and become painful to walk and wear shoes" Patient has been diagnosed with DM with no foot complications. The patient presents for preventative foot care services. No changes to ROS.  Patient is taking plavix and eliquiss.  Podiatric Exam: Vascular: dorsalis pedis and posterior tibial pulses are palpable bilateral. Capillary return is immediate. Temperature gradient is WNL. Skin turgor WNL  Sensorium: Diminished  Semmes Weinstein monofilament test. Normal tactile sensation bilaterally. Nail Exam: Pt has thick disfigured discolored nails with subungual debris noted bilateral entire nail hallux through fifth toenails Ulcer Exam: There is no evidence of ulcer or pre-ulcerative changes or infection. Orthopedic Exam: Muscle tone and strength are WNL. No limitations in general ROM. No crepitus or effusions noted. Foot type and digits show no abnormalities. HAV  B/L with hammer toes. Skin: No Porokeratosis. No infection or ulcers  Diagnosis:  Onychomycosis, , Pain in right toe, pain in left toes  Treatment & Plan Procedures and Treatment: Consent by patient was obtained for treatment procedures. The patient understood the discussion of treatment and procedures well. All questions were answered thoroughly reviewed. Debridement of mycotic and hypertrophic toenails, 1 through 5 bilateral and clearing of subungual debris. No ulceration, no infection noted.   Return Visit-Office Procedure: Patient instructed to return to the office for a follow up visit 10 weeks  for continued evaluation and treatment.    Gardiner Barefoot DPM

## 2019-06-12 ENCOUNTER — Encounter: Payer: Self-pay | Admitting: *Deleted

## 2019-06-12 DIAGNOSIS — Z955 Presence of coronary angioplasty implant and graft: Secondary | ICD-10-CM

## 2019-06-12 NOTE — Progress Notes (Signed)
Cardiac Individual Treatment Plan  Patient Details  Name: RAYKWON HOBBS MRN: [id number] Date of Birth: Mar 30, 1941 Referring Provider:     Cardiac Rehab from 04/09/2019 in [facility] Cardiac and Pulmonary Rehab  Referring Provider  [place]      Initial Encounter Date:    Cardiac Rehab from 04/09/2019 in [facility] Cardiac and Pulmonary Rehab  Date  04/09/19      Visit Diagnosis: Status post coronary artery stent placement  Patient's Home Medications on Admission:  Current Outpatient Medications:  .  acyclovir (ZOVIRAX) 400 MG tablet, TK 1 T PO TID FOR 5 DAYS FOR EACH FEVER BLISTER OUTBREAK, Disp: , Rfl:  .  amiodarone (PACERONE) 400 MG tablet, , Disp: , Rfl:  .  amoxicillin (AMOXIL) 500 MG capsule, Before dental procedures, Disp: , Rfl:  .  aspirin 81 MG tablet, Take 81 mg by mouth daily., Disp: , Rfl:  .  Besifloxacin HCl (BESIVANCE) 0.6 % SUSP, Apply to eye., Disp: , Rfl:  .  Blood Glucose Monitoring Suppl (ONETOUCH VERIO IQ SYSTEM) w/Device KIT, U UTD TO CHECK BS QID, Disp: , Rfl:  .  Brinzolamide-Brimonidine (SIMBRINZA) 1-0.2 % SUSP, Apply 1 drop to eye 2 (two) times daily., Disp: , Rfl:  .  clopidogrel (PLAVIX) 75 MG tablet, 1 tab by mouth daily, Disp: , Rfl:  .  dorzolamide (TRUSOPT) 2 % ophthalmic solution, Administer 2 drops to both eyes Two (2) times a day., Disp: , Rfl:  .  ELIQUIS 5 MG TABS tablet, , Disp: , Rfl:  .  ezetimibe (ZETIA) 10 MG tablet, , Disp: , Rfl:  .  FLUAD 0.5 ML SUSY, TO BE ADMINISTERED BY PHARMACIST FOR IMMUNIZATION, Disp: , Rfl: 0 .  fluticasone (FLONASE) 50 MCG/ACT nasal spray, U 1 SPR NASALLY BID, Disp: , Rfl:  .  Glucosamine HCl 1500 MG TABS, , Disp: , Rfl:  .  glucosamine-chondroitin 500-400 MG tablet, Take 1 tablet by mouth daily., Disp: , Rfl:  .  glucose blood (ONETOUCH VERIO) test strip, TEST 3 TIMES DAILY, Disp: , Rfl:  .  Insulin Detemir (LEVEMIR FLEXTOUCH) 100 UNIT/ML Pen, Inject 35-40 Units into the skin at bedtime. Says 50 units is prescribed but will  usually take 35-40 at night, Disp: , Rfl:  .  insulin lispro (HUMALOG) 100 UNIT/ML KiwkPen, 3 (three) times daily with meals. Sliding Scale., Disp: , Rfl:  .  Insulin Pen Needle (PEN NEEDLES 31GX5/16") 31G X 8 MM MISC, as directed, Disp: , Rfl:  .  INVOKANA 100 MG TABS tablet, , Disp: , Rfl:  .  isosorbide mononitrate (IMDUR) 30 MG 24 hr tablet, Take 30 mg by mouth daily. , Disp: , Rfl:  .  KLOR-CON M20 20 MEQ tablet, Take 20 mEq by mouth 2 (two) times daily., Disp: , Rfl: 2 .  LANTUS SOLOSTAR 100 UNIT/ML Solostar Pen, INJECT 45 UNITS UNDER SKIN ONCE DAILY, Disp: , Rfl: 0 .  latanoprost (XALATAN) 0.005 % ophthalmic solution, , Disp: , Rfl:  .  lisinopril (PRINIVIL,ZESTRIL) 2.5 MG tablet, Take 2.5 mg by mouth daily., Disp: , Rfl:  .  metFORMIN (GLUCOPHAGE) 1000 MG tablet, 1,000 mg 2 (two) times daily with a meal. , Disp: , Rfl:  .  metoprolol succinate (TOPROL-XL) 25 MG 24 hr tablet, TAKE 1/2 TABLET BY MOUTH EVERY DAY, Disp: , Rfl:  .  Multiple Vitamins-Minerals (MULTIVITAMIN ADULT PO), Take by mouth., Disp: , Rfl:  .  nitroGLYCERIN (NITROSTAT) 0.4 MG SL tablet, PLACE 1 TABLET UNDER THE TONGUE EVERY 5 MINUTES  AS NEEDED, Disp: , Rfl:  .  Omega-3 Fatty Acids (FISH OIL) 1000 MG CAPS, Take 4 capsules by mouth 2 (two) times daily. 4 capsules in morning and one at night, Disp: , Rfl:  .  Omega-3 Fatty Acids (OMEGA-3 FISH OIL PO), Take by mouth., Disp: , Rfl:  .  ONETOUCH DELICA LANCETS 57Q MISC, USE 1 LANCET 3 TIMES A DAY, Disp: , Rfl:  .  oxyCODONE (OXY IR/ROXICODONE) 5 MG immediate release tablet, TAKE ONE TABLET BY MOUTH EVERY 4 HOURS AS NEEDED FOR UP TO 5 DAYS, Disp: , Rfl:  .  pravastatin (PRAVACHOL) 40 MG tablet, TAKE 1 TABLET BY MOUTH EVERY EVENING, Disp: , Rfl:  .  prednisoLONE acetate (PRED FORTE) 1 % ophthalmic suspension, PLACE 1 DROP INTO THE RIGHT EYE 6 (SIX) TIMES DAILY, Disp: , Rfl:  .  tamsulosin (FLOMAX) 0.4 MG CAPS capsule, Take 1 capsule (0.4 mg total) by mouth daily., Disp: 90  capsule, Rfl: 3 .  torsemide (DEMADEX) 20 MG tablet, TAKE 2 TABLETS BY MOUTH TWICE A DAY FOR 1 WEEK, THEN 2 IN THE MORNING AND 1 IN THE EVENING, Disp: , Rfl:  .  TRADJENTA 5 MG TABS tablet, , Disp: , Rfl:  .  triamcinolone cream (KENALOG) 0.1 %, Apply topically., Disp: , Rfl:  .  UNABLE TO FIND, Take by mouth., Disp: , Rfl:  .  vitamin B-12 (CYANOCOBALAMIN) 500 MCG tablet, Take 500 mcg by mouth daily., Disp: , Rfl:  .  vitamin C (ASCORBIC ACID) 500 MG tablet, Take 500 mg by mouth daily., Disp: , Rfl:   Past Medical History: Past Medical History:  Diagnosis Date  . Angina pectoris ([place])   . 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 BPH (benign prostatic hypertrophy) with urinary obstruction   . CHF (congestive heart failure) ([place])   . Diabetes mellitus without complication ([place])   . HLD (hyperlipidemia)   . Hypertension   . Obesity   . Penile adhesions   . Sleep apnea   . SOB (shortness of breath)   . Stroke [facility])     Tobacco Use: Social History   Tobacco Use  Smoking Status Former Smoker  . Quit date: 07/11/1981  . Years since quitting: 37.9  Smokeless Tobacco Never Used    Labs: Recent Review Flowsheet Data    There is no flowsheet data to display.       Exercise Target Goals: Exercise Program Goal: Individual exercise prescription set using results from initial 6 min walk test and THRR while considering  patient's activity barriers and safety.   Exercise Prescription Goal: Initial exercise prescription builds to 30-45 minutes a day of aerobic activity, 2-3 days per week.  Home exercise guidelines will be given to patient during program as part of exercise prescription that the participant will acknowledge.  Activity Barriers & Risk Stratification:   6 Minute Walk: 6 Minute Walk    Row Name 04/09/19 1156         6 Minute Walk   Phase  Initial     Distance  965 feet     Walk Time  6 minutes     # of Rest Breaks  0     MPH  1.83     METS  1.6      RPE  11     Perceived Dyspnea   1     VO2 Peak  5.6     Symptoms  No     Resting HR  64 bpm     Resting BP  96/48     Resting Oxygen Saturation   98 %     Exercise Oxygen Saturation  during 6 min walk  98 %     Max Ex. HR  101 bpm     Max Ex. BP  148/64     2 Minute Post BP  108/54        Oxygen Initial Assessment:   Oxygen Re-Evaluation:   Oxygen Discharge (Final Oxygen Re-Evaluation):   Initial Exercise Prescription: Initial Exercise Prescription - 04/09/19 1100      Date of Initial Exercise RX and Referring Provider   Date  04/09/19    Referring Provider  [place]      Recumbant Bike   Level  1    RPM  60    Minutes  15    METs  1.6      NuStep   Level  2    SPM  80    Minutes  15    METs  1.6      REL-XR   Level  2    Speed  50    Minutes  15    METs  1.6      Prescription Details   Frequency (times per week)  3    Duration  Progress to 30 minutes of continuous aerobic without signs/symptoms of physical distress      Intensity   THRR 40-80% of Max Heartrate  95-127    Ratings of Perceived Exertion  11-13    Perceived Dyspnea  0-4      Resistance Training   Training Prescription  Yes    Weight  3 lb   start if cleared by MD post defib    Reps  10-15       Perform Capillary Blood Glucose checks as needed.  Exercise Prescription Changes: Exercise Prescription Changes    Row Name 04/09/19 1200             Response to Exercise   Blood Pressure (Admit)  96/48       Blood Pressure (Exercise)  148/64       Blood Pressure (Exit)  108/54       Heart Rate (Admit)  64 bpm       Heart Rate (Exercise)  101 bpm       Heart Rate (Exit)  82 bpm       Oxygen Saturation (Admit)  98 %       Oxygen Saturation (Exercise)  98 %       Rating of Perceived Exertion (Exercise)  11       Perceived Dyspnea (Exercise)  1          Exercise Comments: Exercise Comments    Row Name 05/06/19 0836 05/08/19 0951         Exercise Comments  First full day of  exercise!  Patient was oriented to gym and equipment including functions, settings, policies, and procedures.  Patient's individual exercise prescription and treatment plan were reviewed.  All starting workloads were established based on the results of the 6 minute walk test done at initial orientation visit.  The plan for exercise progression was also introduced and progression will be customized based on patient's performance and goals.  Casin graduated today from  rehab with 3/36sessions completed.  Details of the patient's exercise prescription and what He needs to do in order to continue the prescription and progress were discussed  with patient.  Patient was given a copy of prescription and goals at admission  Patient verbalized understanding.  Randell plans to continue to exercise by exercising at home Goes to [employer] with his Wife.  Wearing a mask and the daily screening were barriers for Iona Beard         Exercise Goals and Review: Exercise Goals    Row Name 04/09/19 1203             Exercise Goals   Increase Physical Activity  Yes       Intervention  Provide advice, education, support and counseling about physical activity/exercise needs.;Develop an individualized exercise prescription for aerobic and resistive training based on initial evaluation findings, risk stratification, comorbidities and participant's personal goals.       Expected Outcomes  Short Term: Attend rehab on a regular basis to increase amount of physical activity.;Long Term: Add in home exercise to make exercise part of routine and to increase amount of physical activity.;Long Term: Exercising regularly at least 3-5 days a week.       Increase Strength and Stamina  Yes       Intervention  Provide advice, education, support and counseling about physical activity/exercise needs.;Develop an individualized exercise prescription for aerobic and resistive training based on initial evaluation findings, risk  stratification, comorbidities and participant's personal goals.       Expected Outcomes  Short Term: Increase workloads from initial exercise prescription for resistance, speed, and METs.;Short Term: Perform resistance training exercises routinely during rehab and add in resistance training at home;Long Term: Improve cardiorespiratory fitness, muscular endurance and strength as measured by increased METs and functional capacity (6MWT)       Able to understand and use rate of perceived exertion (RPE) scale  Yes       Intervention  Provide education and explanation on how to use RPE scale       Expected Outcomes  Short Term: Able to use RPE daily in rehab to express subjective intensity level;Long Term:  Able to use RPE to guide intensity level when exercising independently       Able to understand and use Dyspnea scale  Yes       Intervention  Provide education and explanation on how to use Dyspnea scale       Expected Outcomes  Short Term: Able to use Dyspnea scale daily in rehab to express subjective sense of shortness of breath during exertion;Long Term: Able to use Dyspnea scale to guide intensity level when exercising independently       Knowledge and understanding of Target Heart Rate Range (THRR)  Yes       Intervention  Provide education and explanation of THRR including how the numbers were predicted and where they are located for reference       Expected Outcomes  Short Term: Able to state/look up THRR;Short Term: Able to use daily as guideline for intensity in rehab;Long Term: Able to use THRR to govern intensity when exercising independently       Able to check pulse independently  Yes       Intervention  Provide education and demonstration on how to check pulse in carotid and radial arteries.;Review the importance of being able to check your own pulse for safety during independent exercise       Expected Outcomes  Short Term: Able to explain why pulse checking is important during independent  exercise;Long Term: Able to check pulse independently and accurately  Understanding of Exercise Prescription  Yes       Intervention  Provide education, explanation, and written materials on patient's individual exercise prescription       Expected Outcomes  Short Term: Able to explain program exercise prescription;Long Term: Able to explain home exercise prescription to exercise independently          Exercise Goals Re-Evaluation : Exercise Goals Re-Evaluation    Row Name 04/30/19 [phone] 05/06/19 0836           Exercise Goal Re-Evaluation   Exercise Goals Review  -  Able to understand and use rate of perceived exertion (RPE) scale;Knowledge and understanding of Target Heart Rate Range (THRR);Understanding of Exercise Prescription      Comments  Has yet to start full day of exercise  Reviewed RPE scale, THR and program prescription with pt today.  Pt voiced understanding and was given a copy of goals to take home.      Expected Outcomes  -  Short: Use RPE daily to regulate intensity. Long: Follow program prescription in THR.         Discharge Exercise Prescription (Final Exercise Prescription Changes): Exercise Prescription Changes - 04/09/19 1200      Response to Exercise   Blood Pressure (Admit)  96/48    Blood Pressure (Exercise)  148/64    Blood Pressure (Exit)  108/54    Heart Rate (Admit)  64 bpm    Heart Rate (Exercise)  101 bpm    Heart Rate (Exit)  82 bpm    Oxygen Saturation (Admit)  98 %    Oxygen Saturation (Exercise)  98 %    Rating of Perceived Exertion (Exercise)  11    Perceived Dyspnea (Exercise)  1       Nutrition:  Target Goals: Understanding of nutrition guidelines, daily intake of sodium <1531m, cholesterol <202m calories 30% from fat and 7% or less from saturated fats, daily to have 5 or more servings of fruits and vegetables.  Biometrics: Pre Biometrics - 04/09/19 1203      Pre Biometrics   Height  _0  (1.702 m)    Weight  227 lb 12.8 oz  (103.3 kg)    BMI (Calculated)  35.67    Single Leg Stand  2.95 seconds        Nutrition Therapy Plan and Nutrition Goals: Nutrition Therapy & Goals - 04/09/19 1042      Nutrition Therapy   Diet  Low Na, HH diet    Protein (specify units)  82g    Fiber  30 grams    Whole Grain Foods  3 servings    Saturated Fats  12 max. grams    Fruits and Vegetables  5 servings/day    Sodium  1.5 grams      Personal Nutrition Goals   Nutrition Goal  ST: no goal LT: live longer    Comments  Pt reports that his diet is "erratic" eating breakfast later and dinner earlier, pt reports that he lost 10 pounds and tries to monitor his portions. PT reports monitoring Na and not using any in his cooking. Pt reports limiting cured meats but will have some bacon with his eggs. Discussed HH eating and DM friendly eating - pt reports that he does not want to make goals and that he may not be here for another 30 days to make new goals as he exercises at home and doesn't think this will be valuble.      Intervention  Plan   Intervention  Prescribe, educate and counsel regarding individualized specific dietary modifications aiming towards targeted core components such as weight, hypertension, lipid management, diabetes, heart failure and other comorbidities.;Nutrition handout(s) given to patient.    Expected Outcomes  Short Term Goal: Understand basic principles of dietary content, such as calories, fat, sodium, cholesterol and nutrients.;Short Term Goal: A plan has been developed with personal nutrition goals set during dietitian appointment.;Long Term Goal: Adherence to prescribed nutrition plan.       Nutrition Assessments:   Nutrition Goals Re-Evaluation:   Nutrition Goals Discharge (Final Nutrition Goals Re-Evaluation):   Psychosocial: Target Goals: Acknowledge presence or absence of significant depression and/or stress, maximize coping skills, provide positive support system. Participant is able to  verbalize types and ability to use techniques and skills needed for reducing stress and depression.   Initial Review & Psychosocial Screening: Initial Psych Review & Screening - 04/04/19 1119      Initial Review   Current issues with  Current Stress Concerns    Comments  Stevens recently went into cardiac arrest which prompted the ICD placement and referral to Cardiac Rehab. His wife is very involved in his care. They are wanting him to exercise under supervision and learn more about his health issues. They reported some stress in coping with all the newness/managing and that once they get him into Cardiac Rehab they will feel a lot better      [place]?  Yes      Screening Interventions   Interventions  Encouraged to exercise;To provide support and resources with identified psychosocial needs;Provide feedback about the scores to participant    Expected Outcomes  Short Term goal: Utilizing psychosocial counselor, staff and physician to assist with identification of specific Stressors or current issues interfering with healing process. Setting desired goal for each stressor or current issue identified.;Long Term Goal: Stressors or current issues are controlled or eliminated.;Short Term goal: Identification and review with participant of any Quality of Life or Depression concerns found by scoring the questionnaire.;Long Term goal: The participant improves quality of Life and PHQ9 Scores as seen by post scores and/or verbalization of changes       Quality of Life Scores:  Quality of Life - 04/09/19 1207      Quality of Life   Select  Quality of Life      Quality of Life Scores   Health/Function Pre  20.8 %    Socioeconomic Pre  30 %    Psych/Spiritual Pre  30 %    Family Pre  25.2 %    GLOBAL Pre  25.37 %      Scores of 19 and below usually indicate a poorer quality of life in these areas.  A difference of  2-3 points is a clinically meaningful difference.  A  difference of 2-3 points in the total score of the Quality of Life Index has been associated with significant improvement in overall quality of life, self-image, physical symptoms, and general health in studies assessing change in quality of life.  PHQ-9: Recent Review Flowsheet Data    Depression screen [facility] 2/9 04/09/2019   Decreased Interest 0   Down, Depressed, Hopeless 0   PHQ - 2 Score 0   Altered sleeping 0   Tired, decreased energy 1   Change in appetite 0   Feeling bad or failure about yourself  0   Trouble concentrating 0   Moving slowly or fidgety/restless 0  Suicidal thoughts 0   PHQ-9 Score 1   Difficult doing work/chores Not difficult at all     Interpretation of Total Score  Total Score Depression Severity:  1-4 = Minimal depression, 5-9 = Mild depression, 10-14 = Moderate depression, 15-19 = Moderately severe depression, 20-27 = Severe depression   Psychosocial Evaluation and Intervention: Psychosocial Evaluation - 04/04/19 1127      Psychosocial Evaluation & Interventions   Comments  Maveric recently went into cardiac arrest which prompted the ICD placement and referral to Cardiac Rehab. His wife is very involved in his care. They are wanting him to exercise under supervision and learn more about his health issues. They reported some stress in coping with all the newness/managing and that once they get him into Cardiac Rehab they will feel a lot better    Expected Outcomes  Short: attend [facility] on a consistent basis. Long: develop self care habits    Continue Psychosocial Services   Follow up required by staff       Psychosocial Re-Evaluation:   Psychosocial Discharge (Final Psychosocial Re-Evaluation):   Vocational Rehabilitation: Provide vocational rehab assistance to qualifying candidates.   Vocational Rehab Evaluation & Intervention: Vocational Rehab - 04/04/19 1119      Initial Vocational Rehab Evaluation & Intervention   Assessment shows need for  Vocational Rehabilitation  No       Education: Education Goals: Education classes will be provided on a variety of topics geared toward better understanding of heart health and risk factor modification. Participant will state understanding/return demonstration of topics presented as noted by education test scores.  Learning Barriers/Preferences: Learning Barriers/Preferences - 04/04/19 1119      Learning Barriers/Preferences   Learning Barriers  None    Learning Preferences  None       Education Topics:  AED/CPR: - Group verbal and written instruction with the use of models to demonstrate the basic use of the AED with the basic ABC's of resuscitation.   General Nutrition Guidelines/Fats and Fiber: -Group instruction provided by verbal, written material, models and posters to present the general guidelines for heart healthy nutrition. Gives an explanation and review of dietary fats and fiber.   Controlling Sodium/Reading Food Labels: -Group verbal and written material supporting the discussion of sodium use in heart healthy nutrition. Review and explanation with models, verbal and written materials for utilization of the food label.   Exercise Physiology & General Exercise Guidelines: - Group verbal and written instruction with models to review the exercise physiology of the cardiovascular system and associated critical values. Provides general exercise guidelines with specific guidelines to those with heart or lung disease.    Aerobic Exercise & Resistance Training: - Gives group verbal and written instruction on the various components of exercise. Focuses on aerobic and resistive training programs and the benefits of this training and how to safely progress through these programs..   Flexibility, Balance, Mind/Body Relaxation: Provides group verbal/written instruction on the benefits of flexibility and balance training, including mind/body exercise modes such as yoga, pilates  and tai chi.  Demonstration and skill practice provided.   Stress and Anxiety: - Provides group verbal and written instruction about the health risks of elevated stress and causes of high stress.  Discuss the correlation between heart/lung disease and anxiety and treatment options. Review healthy ways to manage with stress and anxiety.   Depression: - Provides group verbal and written instruction on the correlation between heart/lung disease and depressed mood, treatment options, and the stigmas  associated with seeking treatment.   Anatomy & Physiology of the Heart: - Group verbal and written instruction and models provide basic cardiac anatomy and physiology, with the coronary electrical and arterial systems. Review of Valvular disease and Heart Failure   Cardiac Procedures: - Group verbal and written instruction to review commonly prescribed medications for heart disease. Reviews the medication, class of the drug, and side effects. Includes the steps to properly store meds and maintain the prescription regimen. (beta blockers and nitrates)   Cardiac Medications I: - Group verbal and written instruction to review commonly prescribed medications for heart disease. Reviews the medication, class of the drug, and side effects. Includes the steps to properly store meds and maintain the prescription regimen.   Cardiac Medications II: -Group verbal and written instruction to review commonly prescribed medications for heart disease. Reviews the medication, class of the drug, and side effects. (all other drug classes)    Go Sex-Intimacy & Heart Disease, Get SMART - Goal Setting: - Group verbal and written instruction through game format to discuss heart disease and the return to sexual intimacy. Provides group verbal and written material to discuss and apply goal setting through the application of the S.M.A.R.T. Method.   Other Matters of the Heart: - Provides group verbal, written materials  and models to describe Stable Angina and Peripheral Artery. Includes description of the disease process and treatment options available to the cardiac patient.   Exercise & Equipment Safety: - Individual verbal instruction and demonstration of equipment use and safety with use of the equipment.   Cardiac Rehab from 04/09/2019 in [facility] Cardiac and Pulmonary Rehab  Date  04/09/19  Educator  AS  Instruction Review Code  1- Verbalizes Understanding      Infection Prevention: - Provides verbal and written material to individual with discussion of infection control including proper hand washing and proper equipment cleaning during exercise session.   Cardiac Rehab from 04/09/2019 in [facility] Cardiac and Pulmonary Rehab  Date  04/09/19  Educator  AS  Instruction Review Code  1- Verbalizes Understanding      Falls Prevention: - Provides verbal and written material to individual with discussion of falls prevention and safety.   Cardiac Rehab from 04/09/2019 in [facility] Cardiac and Pulmonary Rehab  Date  04/09/19  Educator  AS  Instruction Review Code  1- Verbalizes Understanding      Diabetes: - Individual verbal and written instruction to review signs/symptoms of diabetes, desired ranges of glucose level fasting, after meals and with exercise. Acknowledge that pre and post exercise glucose checks will be done for 3 sessions at entry of program.   Cardiac Rehab from 04/04/2019 in [facility] Cardiac and Pulmonary Rehab  Date  04/04/19  Educator  [facility]  Instruction Review Code  1- Verbalizes Understanding      Know Your Numbers and Risk Factors: -Group verbal and written instruction about important numbers in your health.  Discussion of what are risk factors and how they play a role in the disease process.  Review of Cholesterol, Blood Pressure, Diabetes, and BMI and the role they play in your overall health.   Sleep Hygiene: -Provides group verbal and written instruction about how sleep can affect your  health.  Define sleep hygiene, discuss sleep cycles and impact of sleep habits. Review good sleep hygiene tips.    Other: -Provides group and verbal instruction on various topics (see comments)   Knowledge Questionnaire Score: Knowledge Questionnaire Score - 04/09/19 1208  Knowledge Questionnaire Score   Pre Score  20/26       Core Components/Risk Factors/Patient Goals at Admission: Personal Goals and Risk Factors at Admission - 04/09/19 1206      Core Components/Risk Factors/Patient Goals on Admission    Weight Management  Yes    Intervention  Weight Management: Develop a combined nutrition and exercise program designed to reach desired caloric intake, while maintaining appropriate intake of nutrient and fiber, sodium and fats, and appropriate energy expenditure required for the weight goal.    Admit Weight  227 lb 12.8 oz (103.3 kg)       Core Components/Risk Factors/Patient Goals Review:    Core Components/Risk Factors/Patient Goals at Discharge (Final Review):    ITP Comments: ITP Comments    Row Name 04/04/19 1109 04/09/19 1204 04/17/19 1055 04/23/19 1316 04/30/19 0917   ITP Comments  Virtual Initial Orientation completed. Diagnosis can be found in [facility] 9/15. EP/RD Orientation scheduled for 9/29 at 9:30  6 MWT and nutrition consult completed.  ITP created and sent to Dr Sabra Heck for review  30 day review completed. ITP sent to Dr. Emily Filbert, Medical Director of Cardiac and Pulmonary Rehab. Continue with ITP unless changes are made by physician.  Department closed starting 10/2 until further notice by infection prevention and Health at Work teams for Amber.  Pt left message yesterday that he would miss his appointment.  Returned his call.  He will be out for a couple of weeks due to some family obligations.  Pt still out watching grandson in [place].   [place] Name 05/06/19 [id number] 05/08/19 0951 06/12/19 1020       ITP Comments  First full day of exercise!  Patient was  oriented to gym and equipment including functions, settings, policies, and procedures.  Patient's individual exercise prescription and treatment plan were reviewed.  All starting workloads were established based on the results of the 6 minute walk test done at initial orientation visit.  The plan for exercise progression was also introduced and progression will be customized based on patient's performance and goals.  Masai graduated today from  rehab with 3/36sessions completed.  Details of the patient's exercise prescription and what He needs to do in order to continue the prescription and progress were discussed with patient.  Patient was given a copy of prescription and goals at admission  Patient verbalized understanding.  Noriel plans to continue to exercise by exercising at home Goes to [employer] with his Wife.  Wearing a mask and the daily screening were barriers for [facility]  Discharged        Comments:

## 2019-06-13 ENCOUNTER — Ambulatory Visit: Payer: Medicare Other | Admitting: Podiatry

## 2019-06-26 DIAGNOSIS — R7989 Other specified abnormal findings of blood chemistry: Secondary | ICD-10-CM | POA: Insufficient documentation

## 2019-06-26 DIAGNOSIS — E039 Hypothyroidism, unspecified: Secondary | ICD-10-CM | POA: Insufficient documentation

## 2019-08-19 ENCOUNTER — Ambulatory Visit: Payer: Medicare Other | Admitting: Podiatry

## 2019-08-22 ENCOUNTER — Encounter: Payer: Self-pay | Admitting: Podiatry

## 2019-08-22 ENCOUNTER — Ambulatory Visit (INDEPENDENT_AMBULATORY_CARE_PROVIDER_SITE_OTHER): Payer: Medicare Other | Admitting: Podiatry

## 2019-08-22 ENCOUNTER — Other Ambulatory Visit: Payer: Self-pay

## 2019-08-22 ENCOUNTER — Ambulatory Visit: Payer: Medicare Other | Admitting: Podiatry

## 2019-08-22 DIAGNOSIS — B351 Tinea unguium: Secondary | ICD-10-CM | POA: Diagnosis not present

## 2019-08-22 DIAGNOSIS — D689 Coagulation defect, unspecified: Secondary | ICD-10-CM

## 2019-08-22 DIAGNOSIS — M79675 Pain in left toe(s): Secondary | ICD-10-CM | POA: Diagnosis not present

## 2019-08-22 DIAGNOSIS — E1142 Type 2 diabetes mellitus with diabetic polyneuropathy: Secondary | ICD-10-CM

## 2019-08-22 DIAGNOSIS — M79674 Pain in right toe(s): Secondary | ICD-10-CM

## 2019-08-22 NOTE — Progress Notes (Signed)
Complaint:  Visit Type: Patient returns to my office for continued preventative foot care services. Complaint: Patient states" my nails have grown long and thick and become painful to walk and wear shoes" Patient has been diagnosed with DM with no foot complications. The patient presents for preventative foot care services. No changes to ROS.  Patient is taking plavix and eliquiss.  Podiatric Exam: Vascular: dorsalis pedis and posterior tibial pulses are palpable bilateral. Capillary return is immediate. Temperature gradient is WNL. Skin turgor WNL  Sensorium: Diminished  Semmes Weinstein monofilament test. Normal tactile sensation bilaterally. Nail Exam: Pt has thick disfigured discolored nails with subungual debris noted bilateral entire nail hallux through fifth toenails Ulcer Exam: There is no evidence of ulcer or pre-ulcerative changes or infection. Orthopedic Exam: Muscle tone and strength are WNL. No limitations in general ROM. No crepitus or effusions noted. Foot type and digits show no abnormalities. HAV  B/L with hammer toes. Skin: No Porokeratosis. No infection or ulcers  Diagnosis:  Onychomycosis, , Pain in right toe, pain in left toes  Treatment & Plan Procedures and Treatment: Consent by patient was obtained for treatment procedures. The patient understood the discussion of treatment and procedures well. All questions were answered thoroughly reviewed. Debridement of mycotic and hypertrophic toenails, 1 through 5 bilateral and clearing of subungual debris. No ulceration, no infection noted.   Return Visit-Office Procedure: Patient instructed to return to the office for a follow up visit 10 weeks  for continued evaluation and treatment.    Gardiner Barefoot DPM

## 2019-09-09 DIAGNOSIS — Z95810 Presence of automatic (implantable) cardiac defibrillator: Secondary | ICD-10-CM | POA: Insufficient documentation

## 2019-10-22 ENCOUNTER — Other Ambulatory Visit: Payer: Self-pay | Admitting: Urology

## 2019-10-22 DIAGNOSIS — N401 Enlarged prostate with lower urinary tract symptoms: Secondary | ICD-10-CM

## 2019-10-31 ENCOUNTER — Ambulatory Visit (INDEPENDENT_AMBULATORY_CARE_PROVIDER_SITE_OTHER): Payer: Medicare Other | Admitting: Podiatry

## 2019-10-31 ENCOUNTER — Other Ambulatory Visit: Payer: Self-pay

## 2019-10-31 ENCOUNTER — Encounter: Payer: Self-pay | Admitting: Podiatry

## 2019-10-31 VITALS — Temp 98.0°F

## 2019-10-31 DIAGNOSIS — B351 Tinea unguium: Secondary | ICD-10-CM

## 2019-10-31 DIAGNOSIS — M79674 Pain in right toe(s): Secondary | ICD-10-CM

## 2019-10-31 DIAGNOSIS — M2010 Hallux valgus (acquired), unspecified foot: Secondary | ICD-10-CM

## 2019-10-31 DIAGNOSIS — M2041 Other hammer toe(s) (acquired), right foot: Secondary | ICD-10-CM

## 2019-10-31 DIAGNOSIS — M79675 Pain in left toe(s): Secondary | ICD-10-CM

## 2019-10-31 DIAGNOSIS — D689 Coagulation defect, unspecified: Secondary | ICD-10-CM

## 2019-10-31 DIAGNOSIS — M2042 Other hammer toe(s) (acquired), left foot: Secondary | ICD-10-CM

## 2019-10-31 NOTE — Progress Notes (Signed)
This patient returns to my office for at risk foot care.  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  Patient is taking eliquiss.  This patient is unable to cut nails himself since the patient cannot reach his nails.These nails are painful walking and wearing shoes.  This patient presents for at risk foot care today.  General Appearance  Alert, conversant and in no acute stress.  Vascular  Dorsalis pedis and posterior tibial  pulses are palpable  bilaterally.  Capillary return is within normal limits  bilaterally. Temperature is within normal limits  bilaterally.  Neurologic  Senn-Weinstein monofilament wire test diminished  bilaterally. Muscle power within normal limits bilaterally.  Nails Thick disfigured discolored nails with subungual debris  from hallux to fifth toes bilaterally. No evidence of bacterial infection or drainage bilaterally.  Orthopedic  No limitations of motion  feet .  No crepitus or effusions noted.  No bony pathology or digital deformities noted.  HAV  B/L.  Hammer toes  B/L.  Skin  normotropic skin with no porokeratosis noted bilaterally.  No signs of infections or ulcers noted.     Onychomycosis  Pain in right toes  Pain in left toes  Consent was obtained for treatment procedures.   Mechanical debridement of nails 1-5  bilaterally performed with a nail nipper.  Filed with dremel without incident. 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   Return office visit    3 months.                 Told patient to return for periodic foot care and evaluation due to potential at risk complications.   Gardiner Barefoot DPM

## 2019-11-07 ENCOUNTER — Ambulatory Visit (INDEPENDENT_AMBULATORY_CARE_PROVIDER_SITE_OTHER): Payer: Medicare Other | Admitting: Dermatology

## 2019-11-07 ENCOUNTER — Other Ambulatory Visit: Payer: Self-pay

## 2019-11-07 DIAGNOSIS — L578 Other skin changes due to chronic exposure to nonionizing radiation: Secondary | ICD-10-CM | POA: Diagnosis not present

## 2019-11-07 DIAGNOSIS — L821 Other seborrheic keratosis: Secondary | ICD-10-CM | POA: Diagnosis not present

## 2019-11-07 DIAGNOSIS — L570 Actinic keratosis: Secondary | ICD-10-CM | POA: Diagnosis not present

## 2019-11-07 NOTE — Patient Instructions (Addendum)
June 1 Re start Tolak cream apply a thin film to forehead and scalp for 2 weeks, then stop

## 2019-11-07 NOTE — Progress Notes (Signed)
   Follow-Up Visit   Subjective  [name] Ferguson is a 79 y.o. male who presents for the following: Actinic Keratosis (3 months f/u Aks on his scalp). Pt treated scalp with Tolak cream x 2 weeks with a good response,   The following portions of the chart were reviewed this encounter and updated as appropriate:  Tobacco  Allergies  Meds  Problems  Med Hx  Surg Hx  Fam Hx      Review of Systems:  No other skin or systemic complaints except as noted in HPI or Assessment and Plan.  Objective  Well appearing patient in no apparent distress; mood and affect are within normal limits.  A focused examination was performed including face, scalp . Relevant physical exam findings are noted in the Assessment and Plan.  Objective  forehead, scalp (10): Erythematous thin papules/macules with gritty scale.    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, scalp  Destruction of lesion - forehead, scalp Complexity: simple   Destruction method: cryotherapy   Informed consent: discussed and consent obtained   Timeout:  patient name, date of birth, surgical site, and procedure verified Lesion destroyed using liquid nitrogen: Yes   Region frozen until ice ball extended beyond lesion: Yes   Outcome: patient tolerated procedure well with no complications   Post-procedure details: wound care instructions given    Actinic Damage - diffuse scaly erythematous macules with underlying dyspigmentation - Recommend daily broad spectrum sunscreen SPF 30+ to sun-exposed areas, reapply every 2 hours as needed.  - Call for new or changing lesions.  Seborrheic Keratoses - Stuck-on, waxy, tan-brown papules and plaques  - Discussed benign etiology and prognosis. - Observe - Call for any changes  Return in about 6 months (around 05/08/2020) for AK .  [name], CMA, am acting as scribe for [name], MD .  Documentation: I have reviewed the above documentation for accuracy and  completeness, and I agree with the above.  [name], MD

## 2019-11-08 ENCOUNTER — Encounter: Payer: Self-pay | Admitting: Dermatology

## 2019-11-27 ENCOUNTER — Other Ambulatory Visit: Payer: Self-pay

## 2019-11-27 ENCOUNTER — Ambulatory Visit: Payer: Medicare Other | Admitting: Orthotics

## 2019-11-27 DIAGNOSIS — M2042 Other hammer toe(s) (acquired), left foot: Secondary | ICD-10-CM

## 2019-11-27 NOTE — Progress Notes (Signed)

## 2019-12-06 ENCOUNTER — Telehealth: Payer: Self-pay | Admitting: Podiatry

## 2019-12-06 NOTE — Telephone Encounter (Signed)
Called pt and left message to see what shoe patient was wanting to order as it was not written down on the box. I am using his previous shoe size from last couple of years.

## 2019-12-11 ENCOUNTER — Telehealth: Payer: Self-pay | Admitting: Podiatry

## 2019-12-11 NOTE — Telephone Encounter (Signed)
Pt left message yesterday returning my call from last week.   I returned call and pt wants a lace up black low top walking shoe.We previously ordered a size 10w for pt. His doctor that treats his diabetes is Dr Thayer Jew and I have now ordered the shoes.

## 2019-12-25 ENCOUNTER — Other Ambulatory Visit: Payer: Self-pay

## 2019-12-25 DIAGNOSIS — N401 Enlarged prostate with lower urinary tract symptoms: Secondary | ICD-10-CM

## 2019-12-25 DIAGNOSIS — R972 Elevated prostate specific antigen [PSA]: Secondary | ICD-10-CM

## 2019-12-26 ENCOUNTER — Other Ambulatory Visit: Payer: Self-pay

## 2019-12-26 ENCOUNTER — Other Ambulatory Visit: Payer: Medicare Other

## 2019-12-26 DIAGNOSIS — R972 Elevated prostate specific antigen [PSA]: Secondary | ICD-10-CM

## 2019-12-27 LAB — PSA: Prostate Specific Ag, Serum: 2.1 ng/mL (ref 0.0–4.0)

## 2020-01-07 ENCOUNTER — Ambulatory Visit (INDEPENDENT_AMBULATORY_CARE_PROVIDER_SITE_OTHER): Payer: Medicare Other | Admitting: Urology

## 2020-01-07 ENCOUNTER — Other Ambulatory Visit: Payer: Self-pay

## 2020-01-07 ENCOUNTER — Encounter: Payer: Self-pay | Admitting: Urology

## 2020-01-07 VITALS — BP 134/70 | HR 67 | Ht 67.0 in | Wt 230.0 lb

## 2020-01-07 DIAGNOSIS — Z87898 Personal history of other specified conditions: Secondary | ICD-10-CM

## 2020-01-07 DIAGNOSIS — N401 Enlarged prostate with lower urinary tract symptoms: Secondary | ICD-10-CM | POA: Diagnosis not present

## 2020-01-07 DIAGNOSIS — N138 Other obstructive and reflux uropathy: Secondary | ICD-10-CM

## 2020-01-07 NOTE — Progress Notes (Signed)
01/07/2019 10:28 AM   [name] Ferguson 02/17/1941 [id number]  Referring provider: Cletis Athens, MD [address] [place],  [place] [address]   Chief Complaint Patient presents with . Benign Prostatic Hypertrophy   follow up   HPI: 79 yo WM with BPH with LU TS, prostate nodules and a history of sepsis presents for a one year follow up with his wife, [name] Ferguson.   BPH WITH LUTS  (prostate and/or bladder) IPSS score: 13/3   Previous score: 5/2   Previous PVR: 15 mL  Major complaint(s): Frequency, urgency and leakage of urine. Denies any dysuria, hematuria or suprapubic pain.   Currently taking: tamsulosin 0.4 mg daily   Denies any recent fevers, chills, nausea or vomiting.   IPSS    Row Name 01/07/20 0800         International Prostate Symptom Score   How often have you had the sensation of not emptying your bladder? Less than 1 in 5     How often have you had to urinate less than every two hours? About half the time     How often have you found you stopped and started again several times when you urinated? Less than half the time     How often have you found it difficult to postpone urination? Less than half the time     How often have you had a weak urinary stream? Less than 1 in 5 times     How often have you had to strain to start urination? Less than 1 in 5 times     How many times did you typically get up at night to urinate? 3 Times     Total IPSS Score 13       Quality of Life due to urinary symptoms   If you were to spend the rest of your life with your urinary condition just the way it is now how would you feel about that? Mixed            Score:  1-7 Mild 8-19 Moderate 20-35 Severe  PMH:  Past Medical History: Diagnosis Date . Angina pectoris ([place])  . 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 BPH (benign prostatic hypertrophy) with urinary obstruction  . CHF (congestive heart failure) ([place])  . Diabetes mellitus without  complication ([place])  . HLD (hyperlipidemia)  . Hypertension  . Obesity  . Penile adhesions  . Sleep apnea  . SOB (shortness of breath)  . Stroke [facility])    Surgical History:  Past Surgical History: Procedure Laterality Date . BACK SURGERY   . CARDIAC CATHETERIZATION   . COLONOSCOPY   . COLONOSCOPY WITH PROPOFOL N/A 09/22/2015  Procedure: COLONOSCOPY WITH PROPOFOL;  Surgeon: Lucilla Lame, MD;  Location: [facility] ENDOSCOPY;  Service: Endoscopy;  Laterality: N/A; . CORONARY ARTERY BYPASS GRAFT  1996 . HEMORRHOID SURGERY  2007 . HEMORRHOID SURGERY  08-12-15  excision internal hemorrhoid Dr Jamal Collin . JOINT REPLACEMENT Left 2004  Knee   Home Medications:   Allergies as of 01/07/2019     Reactions  Statins Other (See Comments)  Suspected cause of muscle pain   Medication List    Accurate as of January 07, 2019 10:28 AM. If you have any questions, ask your nurse or doctor.    amoxicillin 500 MG capsule Commonly known as: AMOXIL Before dental procedures  aspirin 81 MG tablet Take 81 mg by mouth daily.  Besivance 0.6 % Susp Generic drug: Besifloxacin HCl Apply to eye.  clopidogrel  75 MG tablet Commonly known as: PLAVIX 1 tab by mouth daily  dorzolamide 2 % ophthalmic solution Commonly known as: TRUSOPT Administer 2 drops to both eyes Two (2) times a day.  Eliquis 5 MG Tabs tablet Generic drug: apixaban  Fish Oil 1000 MG Caps Take 4 capsules by mouth 2 (two) times daily. 4 capsules in morning and one at night  OMEGA-3 FISH OIL PO Take by mouth.  Fluad 0.5 ML Susy Generic drug: Influenza Vac A&B Surf Ant Adj TO BE ADMINISTERED BY PHARMACIST FOR IMMUNIZATION  fluticasone 50 MCG/ACT nasal spray Commonly known as: FLONASE U 1 SPR NASALLY BID  glucosamine-chondroitin 500-400 MG tablet Take 1 tablet by mouth daily.  insulin lispro 100 UNIT/ML KiwkPen Commonly known as: HUMALOG 3 (three) times daily with meals. Sliding  Scale.  isosorbide mononitrate 30 MG 24 hr tablet Commonly known as: IMDUR Take 30 mg by mouth daily.  Klor-Con M20 20 MEQ tablet Generic drug: potassium chloride SA Take 20 mEq by mouth 2 (two) times daily.  Lantus SoloStar 100 UNIT/ML Solostar Pen Generic drug: Insulin Glargine INJECT 45 UNITS UNDER SKIN ONCE DAILY  latanoprost 0.005 % ophthalmic solution Commonly known as: XALATAN  Levemir FlexTouch 100 UNIT/ML Pen Generic drug: Insulin Detemir Inject 35-40 Units into the skin at bedtime. Says 50 units is prescribed but will usually take 35-40 at night  lisinopril 2.5 MG tablet Commonly known as: ZESTRIL Take 2.5 mg by mouth daily.  metFORMIN 1000 MG tablet Commonly known as: GLUCOPHAGE 1,000 mg 2 (two) times daily with a meal.  metoprolol succinate 25 MG 24 hr tablet Commonly known as: TOPROL-XL TAKE 1/2 TABLET BY MOUTH EVERY DAY  MULTIVITAMIN ADULT PO Take by mouth.  nitroGLYCERIN 0.4 MG SL tablet Commonly known as: NITROSTAT PLACE 1 TABLET UNDER THE TONGUE EVERY 5 MINUTES AS NEEDED  OneTouch Delica Lancets 41Y Misc USE 1 LANCET 3 TIMES A DAY  OneTouch Verio IQ System w/Device Kit U UTD TO CHECK BS QID  OneTouch Verio test strip Generic drug: glucose blood TEST 3 TIMES DAILY  PEN NEEDLES 31GX5/16" 31G X 8 MM Misc as directed  pravastatin 40 MG tablet Commonly known as: PRAVACHOL TAKE 1 TABLET BY MOUTH EVERY EVENING  Simbrinza 1-0.2 % Susp Generic drug: Brinzolamide-Brimonidine Apply 1 drop to eye 2 (two) times daily.  tamsulosin 0.4 MG Caps capsule Commonly known as: FLOMAX Take 1 capsule (0.4 mg total) by mouth daily.  torsemide 20 MG tablet Commonly known as: DEMADEX TAKE 2 TABLETS BY MOUTH TWICE A DAY FOR 1 WEEK, THEN 2 IN THE MORNING AND 1 IN THE EVENING  Tradjenta 5 MG Tabs tablet Generic drug: linagliptin  triamcinolone cream 0.1 % Commonly known as: KENALOG Apply topically.  UNABLE TO FIND Take by  mouth.  vitamin B-12 500 MCG tablet Commonly known as: CYANOCOBALAMIN Take 500 mcg by mouth daily.  vitamin C 500 MG tablet Commonly known as: ASCORBIC ACID Take 500 mg by mouth daily.     Allergies:   Allergies Allergen Reactions . Statins Other (See Comments)   Suspected cause of muscle pain   Family History:  Family History Problem Relation Age of Onset . Diabetes Mother  . Alzheimer's disease Mother  . Cancer Sister  . Heart disease Unknown       Family members in general . Diabetes Brother  . Prostate cancer Neg Hx  . Bladder Cancer Neg Hx  . Kidney disease Neg Hx    Social History:  reports that he quit smoking  about 37 years ago. He has never used smokeless tobacco. He reports current alcohol use. He reports that he does not use drugs.  ROS: UROLOGY Frequent Urination?: No Hard to postpone urination?: No Burning/pain with urination?: No Get up at night to urinate?: No Leakage of urine?: No Urine stream starts and stops?: No Trouble starting stream?: No Do you have to strain to urinate?: No Blood in urine?: No Urinary tract infection?: No Sexually transmitted disease?: No Injury to kidneys or bladder?: No Painful intercourse?: No Weak stream?: No Erection problems?: No Penile pain?: No  Gastrointestinal Nausea?: No Vomiting?: No Indigestion/heartburn?: No Diarrhea?: No Constipation?: No  Constitutional Fever: No Night sweats?: No Weight loss?: No Fatigue?: No  Skin Skin rash/lesions?: No Itching?: No  Eyes Blurred vision?: No Double vision?: No  Ears/Nose/Throat Sore throat?: No Sinus problems?: No  Hematologic/Lymphatic Swollen glands?: No Easy bruising?: No  Cardiovascular Leg swelling?: Yes Chest pain?: No  Respiratory Cough?: No Shortness of breath?: Yes  Endocrine Excessive thirst?: No  Musculoskeletal Back pain?: No Joint pain?: Yes  Neurological Headaches?:  No Dizziness?: Yes  Psychologic Depression?: No Anxiety?: No  Physical Exam: BP 132/64   Pulse 89   Ht '5\' 8"'  (1.727 m)   Wt 225 lb (102.1 kg)   BMI 34.21 kg/m   Constitutional:  Well nourished. Alert and oriented, No acute distress. HEENT: [place] AT, mask in place.  Trachea midline.  Cardiovascular: No clubbing, cyanosis, or edema. Respiratory: Normal respiratory effort, no increased work of breathing. GI: Abdomen is soft, non tender, non distended, no abdominal masses. Liver and spleen not palpable.  No hernias appreciated.  Stool sample for occult testing is not indicated.   GU: No CVA tenderness.  No bladder fullness or masses.  Patient with uncircumcised phallus. Foreskin easily retracted.  Urethral meatus is patent.  No penile discharge. No penile lesions or rashes. Scrotum without lesions, cysts, rashes and/or edema.  Testicles are located scrotally bilaterally. No masses are appreciated in the testicles. Left and right epididymis are normal. Rectal: Patient with  normal sphincter tone. Anus and perineum without scarring or rashes. No rectal masses are appreciated. Prostate is approximately 50 grams, right lobe is slightly larger than his left lobe,  5 mm rubbery nodules are appreciated. Seminal vesicles could not be palpated. Skin: No rashes, bruises or suspicious lesions. Lymph: No inguinal adenopathy. Neurologic: Grossly intact, no focal deficits, moving all 4 extremities. Psychiatric: Normal mood and affect.  Laboratory Data: PSA History             2.0 ng/mL on 09/07/2015             7.34 ng/mL on 06/10/2016             2.9 ng/mL on 06/27/2016             2.0 ng/mL on 09/07/2015             1.9 ng/mL on 12/20/2016             2.1 ng/mL on 12/26/2017             2.0 ng/mL on 12/20/2018  2.1 ng/mL on 12/26/2019             I have reviewed the labs.  I have reviewed the labs.  Pertinent imaging No recent imaging   Assessment & Plan:    1. BPH with LUTS IPSS  score is 13/3, it is worsened Continue conservative management, avoiding bladder irritants and timed voiding's  Most bothersome symptoms is/are frequency - patient takes 40 mg of Torsemide and is aware that this contributes to his frequency. He remains on finasteride and and does not want to pursue any other treatments at this time  Continue tamsulosin 0.4 mg daily:refills given RTC in 12 months for IPSS, PSA, PVR and exam   2. History of elevated PSA Most likely due to infection - PSA stabilized  Patient and wife are still wanting to have yearly PSA's at this time  Return in about 1 year (around 01/07/2020) for IPSS, PSA and exam.  These notes generated with voice recognition software. I apologize for typographical errors.  Zara Council, PA-C  [facility] [address] [place], [place] [address] [phone]  I, Joneen Boers Peace, am acting as a Education administrator for [employer].  I have reviewed the above documentation for accuracy and completeness, and I agree with the above.    Zara Council, PA-C

## 2020-01-09 ENCOUNTER — Other Ambulatory Visit: Payer: Self-pay

## 2020-01-09 ENCOUNTER — Encounter: Payer: Self-pay | Admitting: Podiatry

## 2020-01-09 ENCOUNTER — Ambulatory Visit (INDEPENDENT_AMBULATORY_CARE_PROVIDER_SITE_OTHER): Payer: Medicare Other | Admitting: Podiatry

## 2020-01-09 DIAGNOSIS — M79675 Pain in left toe(s): Secondary | ICD-10-CM

## 2020-01-09 DIAGNOSIS — D689 Coagulation defect, unspecified: Secondary | ICD-10-CM | POA: Diagnosis not present

## 2020-01-09 DIAGNOSIS — E1142 Type 2 diabetes mellitus with diabetic polyneuropathy: Secondary | ICD-10-CM

## 2020-01-09 DIAGNOSIS — M79674 Pain in right toe(s): Secondary | ICD-10-CM

## 2020-01-09 DIAGNOSIS — B351 Tinea unguium: Secondary | ICD-10-CM

## 2020-01-09 NOTE — Progress Notes (Signed)
This patient returns to my office for at risk foot care.  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  Patient is taking eliquiss.  This patient is unable to cut nails himself since the patient cannot reach his nails.These nails are painful walking and wearing shoes.  This patient presents for at risk foot care today.  General Appearance  Alert, conversant and in no acute stress.  Vascular  Dorsalis pedis and posterior tibial  pulses are palpable  bilaterally.  Capillary return is within normal limits  bilaterally. Temperature is within normal limits  bilaterally.  Neurologic  Senn-Weinstein monofilament wire test diminished  bilaterally. Muscle power within normal limits bilaterally.  Nails Thick disfigured discolored nails with subungual debris  from hallux to fifth toes bilaterally. No evidence of bacterial infection or drainage bilaterally.  Orthopedic  No limitations of motion  feet .  No crepitus or effusions noted.  No bony pathology or digital deformities noted.  HAV  B/L.  Hammer toes  B/L.  Skin  normotropic skin with no porokeratosis noted bilaterally.  No signs of infections or ulcers noted.     Onychomycosis  Pain in right toes  Pain in left toes  Consent was obtained for treatment procedures.   Mechanical debridement of nails 1-5  bilaterally performed with a nail nipper.  Filed with dremel without incident. 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   Return office visit    10 weeks                 Told patient to return for periodic foot care and evaluation due to potential at risk complications.   Gardiner Barefoot DPM

## 2020-02-17 ENCOUNTER — Telehealth: Payer: Self-pay | Admitting: Podiatry

## 2020-02-17 NOTE — Telephone Encounter (Signed)
Pt left 2 messages asking about status of diabetic shoes..  I returned call and upon checking we had the wrong doctor was on the forms so I have corrected it to Dr Thayer Jew and just hand faxed the paperwork to that office. He did say every year it takes long to get his shoes and I apologized

## 2020-03-19 ENCOUNTER — Other Ambulatory Visit: Payer: Self-pay

## 2020-03-19 ENCOUNTER — Telehealth: Payer: Self-pay | Admitting: Podiatry

## 2020-03-19 ENCOUNTER — Encounter: Payer: Self-pay | Admitting: Podiatry

## 2020-03-19 ENCOUNTER — Ambulatory Visit (INDEPENDENT_AMBULATORY_CARE_PROVIDER_SITE_OTHER): Payer: Medicare Other | Admitting: Podiatry

## 2020-03-19 DIAGNOSIS — M2041 Other hammer toe(s) (acquired), right foot: Secondary | ICD-10-CM

## 2020-03-19 DIAGNOSIS — M79675 Pain in left toe(s): Secondary | ICD-10-CM

## 2020-03-19 DIAGNOSIS — B351 Tinea unguium: Secondary | ICD-10-CM | POA: Diagnosis not present

## 2020-03-19 DIAGNOSIS — D689 Coagulation defect, unspecified: Secondary | ICD-10-CM | POA: Diagnosis not present

## 2020-03-19 DIAGNOSIS — E1142 Type 2 diabetes mellitus with diabetic polyneuropathy: Secondary | ICD-10-CM

## 2020-03-19 DIAGNOSIS — M2042 Other hammer toe(s) (acquired), left foot: Secondary | ICD-10-CM

## 2020-03-19 DIAGNOSIS — M79674 Pain in right toe(s): Secondary | ICD-10-CM

## 2020-03-19 NOTE — Telephone Encounter (Signed)
Pt left 2 messages asking about diabetic shoe status and why it is taking so long.  I returned call and upon looking the paperwork was received but it was stamped and per medicare they cannot stamp. I apologized to pt and told pt I would refax the paperwork with a note to Dr Thayer Jew asking her to please resign the paperwork and please do not use a stamp for signature or dates.

## 2020-03-19 NOTE — Progress Notes (Signed)
This patient returns to my office for at risk foot care.  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  Patient is taking eliquiss.  This patient is unable to cut nails himself since the patient cannot reach his nails.These nails are painful walking and wearing shoes.  This patient presents for at risk foot care today.  General Appearance  Alert, conversant and in no acute stress.  Vascular  Dorsalis pedis and posterior tibial  pulses are palpable  bilaterally.  Capillary return is within normal limits  bilaterally. Temperature is within normal limits  bilaterally.  Neurologic  Senn-Weinstein monofilament wire test diminished  bilaterally. Muscle power within normal limits bilaterally.  Nails Thick disfigured discolored nails with subungual debris  from hallux to fifth toes bilaterally. No evidence of bacterial infection or drainage bilaterally.  Orthopedic  No limitations of motion  feet .  No crepitus or effusions noted.  No bony pathology or digital deformities noted.  HAV  B/L.  Hammer toes  B/L.  Skin  normotropic skin with no porokeratosis noted bilaterally.  No signs of infections or ulcers noted.     Onychomycosis  Pain in right toes  Pain in left toes  Consent was obtained for treatment procedures.   Mechanical debridement of nails 1-5  bilaterally performed with a nail nipper.  Filed with dremel without incident.    Return office visit    10 weeks                 Told patient to return for periodic foot care and evaluation due to potential at risk complications.   Gardiner Barefoot DPM

## 2020-04-06 ENCOUNTER — Ambulatory Visit (INDEPENDENT_AMBULATORY_CARE_PROVIDER_SITE_OTHER): Payer: PRIVATE HEALTH INSURANCE | Admitting: Internal Medicine

## 2020-04-06 ENCOUNTER — Ambulatory Visit: Payer: Medicare Other | Admitting: Internal Medicine

## 2020-04-06 VITALS — BP 120/70 | HR 73 | Ht 67.0 in | Wt 227.0 lb

## 2020-04-06 DIAGNOSIS — Z7189 Other specified counseling: Secondary | ICD-10-CM | POA: Diagnosis not present

## 2020-04-06 DIAGNOSIS — G4733 Obstructive sleep apnea (adult) (pediatric): Secondary | ICD-10-CM | POA: Diagnosis not present

## 2020-04-06 DIAGNOSIS — Z9989 Dependence on other enabling machines and devices: Secondary | ICD-10-CM

## 2020-04-06 DIAGNOSIS — I480 Paroxysmal atrial fibrillation: Secondary | ICD-10-CM | POA: Diagnosis not present

## 2020-04-06 NOTE — Patient Instructions (Signed)

## 2020-04-06 NOTE — Progress Notes (Signed)
Patient was no-show for appointment.  The office staff will contact the patient for rescheduling follow-up. 

## 2020-04-06 NOTE — Progress Notes (Signed)
[facility] [place], [place] [address]  Pulmonary Sleep Medicine   Office Visit Note  Patient Name: [name] Ferguson DOB: [birth year] MRN [id number]    Chief Complaint: Obstructive Sleep Apnea visit  Brief History:  [name] Ferguson is seen today for follow up. He recently received a replacement CPAP. The patient has a 13 year history of sleep apnea. Patient is using PAP nightly. [place] so [place] does not reflect his usage. The patient feels more rested after sleeping with PAP.  He wakes to urinate 2-3 times but otherwise sleeps well.The patient reports benefiting from PAP use. Reported sleepiness is  Improved  and the Epworth Sleepiness Score is 8 out of 24. The patient occasionally take naps but does this in his rocker. The patient complains of the following: dry mouth The compliance download shows good compliance with an average use time of 7.4 hours. The AHI is 0.4  The patient does not of limb movements disrupting sleep.  ROS  General: (-) fever, (-) chills, (-) night sweat Nose and Sinuses: (-) nasal stuffiness or itchiness, (-) postnasal drip, (-) nosebleeds, (-) sinus trouble. Mouth and Throat: (-) sore throat, (-) hoarseness. Neck: (-) swollen glands, (-) enlarged thyroid, (-) neck pain. Respiratory: - cough, - shortness of breath, - wheezing. Neurologic: - numbness, - tingling. Psychiatric: - anxiety, - depression   Current Medication: Outpatient Encounter Medications as of 04/06/2020  Medication Sig  . acyclovir (ZOVIRAX) 400 MG tablet TK 1 T PO TID FOR 5 DAYS FOR EACH FEVER BLISTER OUTBREAK  . amiodarone (PACERONE) 200 MG tablet Take by mouth.  Marland Kitchen amoxicillin (AMOXIL) 500 MG capsule Before dental procedures  . Aspirin-Calcium Carbonate 81-777 MG TABS Take by mouth.  . Besifloxacin HCl (BESIVANCE) 0.6 % SUSP Apply to eye.  . Blood Glucose Monitoring Suppl (ONETOUCH VERIO IQ SYSTEM) w/Device KIT U UTD TO CHECK BS QID   . brimonidine (ALPHAGAN) 0.15 % ophthalmic solution Place 1 drop into the left eye 2 (two) times daily.  . Brinzolamide-Brimonidine (SIMBRINZA) 1-0.2 % SUSP Apply 1 drop to eye 2 (two) times daily.  . cefdinir (OMNICEF) 300 MG capsule Take 300 mg by mouth 2 (two) times daily.  . clopidogrel (PLAVIX) 75 MG tablet 1 tab by mouth daily  . dorzolamide (TRUSOPT) 2 % ophthalmic solution Administer 2 drops to both eyes Two (2) times a day.  Marland Kitchen ELIQUIS 5 MG TABS tablet   . ezetimibe (ZETIA) 10 MG tablet Take 1 tablet by mouth daily.  Marland Kitchen FLUAD 0.5 ML SUSY TO BE ADMINISTERED BY PHARMACIST FOR IMMUNIZATION  . fluticasone (FLONASE) 50 MCG/ACT nasal spray U 1 SPR NASALLY BID  . glucagon 1 MG injection Use as directed for emergent low blood sugar,  . Glucosamine HCl 1500 MG TABS   . glucosamine-chondroitin 500-400 MG tablet Take 1 tablet by mouth daily.  Marland Kitchen glucose blood (ONETOUCH VERIO) test strip TEST 3 TIMES DAILY  . Insulin Detemir (LEVEMIR FLEXTOUCH) 100 UNIT/ML Pen Inject 35-40 Units into the skin at bedtime. Says 50 units is prescribed but will usually take 35-40 at night  . insulin glargine (LANTUS SOLOSTAR) 100 UNIT/ML Solostar Pen Inject into the skin.  . Insulin Pen Needle (PEN NEEDLES 31GX5/16") 31G X 8 MM MISC as directed  . INVOKANA 100 MG TABS tablet   . isosorbide mononitrate (IMDUR) 30 MG 24 hr tablet Take 30 mg by mouth daily.   Marland Kitchen KLOR-CON M20 20 MEQ tablet Take  20 mEq by mouth 2 (two) times daily.  Marland Kitchen latanoprost (XALATAN) 0.005 % ophthalmic solution   . levothyroxine (SYNTHROID) 50 MCG tablet PLEASE SEE ATTACHED FOR DETAILED DIRECTIONS  . lisinopril (ZESTRIL) 10 MG tablet Take 10 mg by mouth every morning.  . metFORMIN (GLUCOPHAGE) 1000 MG tablet 1,000 mg 2 (two) times daily with a meal.   . metoprolol succinate (TOPROL-XL) 25 MG 24 hr tablet TAKE 1/2 TABLET BY MOUTH EVERY DAY  . Multiple Vitamin (MULTI-VITAMIN) tablet Take by mouth.  . Multiple Vitamins-Minerals (MULTIVITAMIN ADULT PO)  Take by mouth.  . nitroGLYCERIN (NITROSTAT) 0.4 MG SL tablet PLACE 1 TABLET UNDER THE TONGUE EVERY 5 MINUTES AS NEEDED  . Omega-3 Fatty Acids (FISH OIL) 1000 MG CAPS Take 4 capsules by mouth 2 (two) times daily. 4 capsules in morning and one at night  . Omega-3 Fatty Acids (OMEGA-3 FISH OIL PO) Take by mouth.  Glory Rosebush DELICA LANCETS 61P MISC USE 1 LANCET 3 TIMES A DAY  . oxyCODONE (OXY IR/ROXICODONE) 5 MG immediate release tablet TAKE ONE TABLET BY MOUTH EVERY 4 HOURS AS NEEDED FOR UP TO 5 DAYS  . pravastatin (PRAVACHOL) 40 MG tablet TAKE 1 TABLET BY MOUTH EVERY EVENING  . prednisoLONE acetate (PRED FORTE) 1 % ophthalmic suspension PLACE 1 DROP INTO THE RIGHT EYE 6 (SIX) TIMES DAILY  . REPATHA SURECLICK 509 MG/ML SOAJ Inject 1 mL into the skin every 14 (fourteen) days.  . tamsulosin (FLOMAX) 0.4 MG CAPS capsule TAKE 1 CAPSULE BY MOUTH EVERY DAY  . torsemide (DEMADEX) 20 MG tablet Take by mouth.  . TRADJENTA 5 MG TABS tablet   . triamcinolone cream (KENALOG) 0.1 % Apply topically.  Marland Kitchen UNABLE TO FIND Take by mouth.  . vitamin B-12 (CYANOCOBALAMIN) 500 MCG tablet Take 500 mcg by mouth daily.  . vitamin C (ASCORBIC ACID) 500 MG tablet Take 500 mg by mouth daily.   No facility-administered encounter medications on file as of 04/06/2020.    Surgical History: Past Surgical History:  Procedure Laterality Date  . BACK SURGERY    . CARDIAC CATHETERIZATION    . COLONOSCOPY    . COLONOSCOPY WITH PROPOFOL N/A 09/22/2015   Procedure: COLONOSCOPY WITH PROPOFOL;  Surgeon: Lucilla Lame, MD;  Location: [facility] ENDOSCOPY;  Service: Endoscopy;  Laterality: N/A;  . CORONARY ARTERY BYPASS GRAFT  1996  . HEMORRHOID SURGERY  2007  . HEMORRHOID SURGERY  08-12-15   excision internal hemorrhoid Dr Jamal Collin  . JOINT REPLACEMENT Left 2004   Knee    Medical History: Past Medical History:  Diagnosis Date  . Actinic keratosis   . Angina pectoris ([place])   . 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 BPH  (benign prostatic hypertrophy) with urinary obstruction   . CHF (congestive heart failure) ([place])   . Diabetes mellitus without complication ([place])   . HLD (hyperlipidemia)   . Hypertension   . Obesity   . Penile adhesions   . Sleep apnea   . SOB (shortness of breath)   . 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 SCCis  . Squamous cell carcinoma of skin 01/22/2018   Left proximal dorsum forearm, SCCis associated with verruca.  . Stroke [facility])     Family History: Non contributory to the present illness  Social History: Social History   Socioeconomic History  . Marital status: Married    Spouse name: Not on file  . Number of children: Not on file  . Years of education:  Not on file  . Highest education level: Not on file  Occupational History  . Not on file  Tobacco Use  . Smoking status: Former Smoker    Quit date: 07/11/1981    Years since quitting: 38.7  . Smokeless tobacco: Never Used  Substance and Sexual Activity  . Alcohol use: Yes    Alcohol/week: 0.0 standard drinks    Comment: occasionally  . Drug use: No  . Sexual activity: Not on file  Other Topics Concern  . Not on file  Social History Narrative  . Not on file   Social Determinants of Health   Financial Resource Strain:   . Difficulty of Paying Living Expenses: Not on file  Food Insecurity:   . Worried About Charity fundraiser in the Last Year: Not on file  . Ran Out of Food in the Last Year: Not on file  Transportation Needs:   . Lack of Transportation (Medical): Not on file  . Lack of Transportation (Non-Medical): Not on file  Physical Activity:   . Days of Exercise per Week: Not on file  . Minutes of Exercise per Session: Not on file  Stress:   . Feeling of Stress : Not on file  Social Connections:   . Frequency of Communication with Friends and Family: Not on file  . Frequency of Social Gatherings with Friends and Family: Not on file  . Attends Religious Services: Not  on file  . Active Member of Clubs or Organizations: Not on file  . Attends Archivist Meetings: Not on file  . Marital Status: Not on file  Intimate Partner Violence:   . Fear of Current or Ex-Partner: Not on file  . Emotionally Abused: Not on file  . Physically Abused: Not on file  . Sexually Abused: Not on file    Vital Signs: There were no vitals taken for this visit.  Examination: General Appearance: The patient is well-developed, well-nourished, and in no distress. Neck Circumference: 44 Skin: Gross inspection of skin unremarkable. Head: normocephalic, no gross deformities. Eyes: no gross deformities noted. ENT: ears appear grossly normal Neurologic: Alert and oriented. No involuntary movements.    EPWORTH SLEEPINESS SCALE:  Scale:  (0)= no chance of dozing; (1)= slight chance of dozing; (2)= moderate chance of dozing; (3)= high chance of dozing  Chance  Situtation    Sitting and reading: 2    Watching TV: 2    Sitting Inactive in public: 2    As a passenger in car: 0      Lying down to rest: 2    Sitting and talking: 0    Sitting quielty after lunch: 0    In a car, stopped in traffic: 0   TOTAL SCORE:   8 out of 24    SLEEP STUDIES:  1. Split 2008 AHi 42   CPAP COMPLIANCE DATA:  Date Range: 03/07/20-04/05/20  Average Daily Use: 7.4 hours  Median Use: 7.3  Compliance for > 4 Hours: 80%  AHI: 0.4 respiratory events per hour  Days Used: 24/30  Mask Leak: 42.3  95th Percentile Pressure: 16.7/10.7         LABS: No results found for this or any previous visit (from the past 2160 hour(s)).  Radiology: CT ANGIO LOW EXTREM LEFT W &/OR WO CONTRAST  Result Date: 04/13/2017 CLINICAL DATA:  Penetrating trauma to the left upper leg. Worsening pain and swelling. A marker is placed on the area of interest. EXAM: CT ANGIOGRAPHY OF  THE left lowerEXTREMITY TECHNIQUE: Multidetector CT imaging of the left lowerwas performed using the  standard protocol during bolus administration of intravenous contrast. Multiplanar CT image reconstructions and MIPs were obtained to evaluate the vascular anatomy. CONTRAST:  125 mL Isovue 370 COMPARISON:  None. FINDINGS: Pelvic vessels: The visualized distal portion of the left external iliac artery is patent with mild scattered calcification. No evidence of stenosis. Left lower extremity vessels: The left common femoral, superficial femoral, deep femoral, and popliteal arteries are patent to the level of the tibial trunk. Scattered calcifications are present without evidence of critical stenosis. No contrast extravasation. Soft tissues: Corresponding to the marked area of interest, there is evidence of expansion and loss of fat planes throughout the anterior compartment quadriceps musculature consistent with intramuscular hematoma. The hematoma appears to be mostly contained within the vastus intermedius musculature and measures about 3.6 x 6.6 cm in diameter. No contrast extravasation is demonstrated. There is a moderate-sized left knee effusion. Bones: Previous left total knee replacement. Surgical clips in the soft tissues. No acute fracture or focal bony abnormalities demonstrated. Review of the MIP images confirms the above findings. IMPRESSION: 1. Scattered arterial vascular calcifications without significant stenosis. No contrast extravasation or dissection to suggest vascular injury. 2. Heterogeneous appearance enlargement of the vastus intermedius musculature suggesting intramuscular hematoma. 3. Moderate left knee effusion. Previous left total knee replacement. Electronically Signed   By: Lucienne Capers M.D.   On: 04/13/2017 23:23   DG FEMUR MIN 2 VIEWS LEFT  Result Date: 04/13/2017 CLINICAL DATA:  Left leg pain after injury. Swelling to the left upper thigh. History of diabetes. EXAM: LEFT FEMUR 2 VIEWS COMPARISON:  None. FINDINGS: Left hip and left femur appear intact. No evidence of acute  fracture or dislocation. The left total knee arthroplasty is demonstrated with incomplete visualization. Vascular calcifications and surgical clips in the soft tissues. IMPRESSION: No acute bony abnormalities. Electronically Signed   By: Lucienne Capers M.D.   On: 04/13/2017 21:59    No results found.  No results found.    Assessment and Plan: Patient Active Problem List   Diagnosis Date Noted  . Automatic implantable cardioverter-defibrillator in situ 09/09/2019  . Abnormal TSH 06/26/2019  . History of cardiac arrest 04/10/2019  . Stage 3a chronic kidney disease 04/10/2019  . Coagulation disorder ([place]) 03/28/2019  . Cardiac arrest with ventricular fibrillation ([place]) 03/18/2019  . Moderate right ventricular systolic dysfunction 35/78/9784  . Pain due to onychomycosis of toenails of both feet 01/17/2019  . Diabetic neuropathy ([place]) 01/17/2019  . On continuous oral anticoagulation 12/24/2018  . Paroxysmal atrial fibrillation ([place]) 12/24/2018  . Primary open angle glaucoma of both eyes, severe stage 12/04/2018  . Pseudophakia of both eyes 12/04/2018  . Vision changes 12/04/2018  . Stroke ([place]) 06/26/2017  . Intramuscular hematoma 04/13/2017  . Acute on chronic diastolic heart failure ([place]) 10/25/2016  . OSA on CPAP 10/25/2016  . Chest pain, atypical 10/24/2016  . Shortness of breath 10/24/2016  . Bacteremia due to Gram-negative bacteria 06/07/2016  . Hypokalemia 06/07/2016  . Hypomagnesemia 06/07/2016  . Diarrhea, unspecified 06/06/2016  . Thrombocytopenia ([place]) 06/06/2016  . Sepsis due to urinary tract infection ([place]) 06/05/2016  . BPH with obstruction/lower urinary tract symptoms 09/22/2015  . Special screening for malignant neoplasms, colon   . Rectal polyp   . Adiposity 03/18/2014  . Heart failure ([place]) 11/07/2013  . Type 2 diabetes mellitus treated with insulin ([place]) 04/08/2011  . HLD (hyperlipidemia) 04/08/2011  . Arthritis of knee,  degenerative 04/08/2011  .  Diabetes ([place]) 05/15/2007  . HYPERLIPIDEMIA 05/15/2007  . Essential hypertension 05/15/2007  . Coronary atherosclerosis 05/15/2007  . DYSPNEA ON EXERTION 05/15/2007  . TOBACCO ABUSE, HX OF 05/15/2007  . CORONARY ARTERY BYPASS GRAFT, HX OF 05/15/2007      The patient does tolerate PAP and reports significant benefit from PAP use. The patient was reminded how to clean unit and change the humidity setting. He was advised to replace his mask seal more frequently to prevent mask leak. The patient was also counselled on the importance of weight loss in controlling the apnea. He is exercising regularly. The compliance is excellent. He has 2 machines he uses regularly. The apnea is well controlled   1. OSA- continue excellent compliance. 2. CPAP counseling-Discussed importance of adequate CPAP use time as well as proper care and cleaning techniques for CPAP and all supplies.  3. Obesity diet and exercise counseling 4. Atrial fibrillation medical management and CPAP use  General Counseling: I have discussed the findings of the evaluation and examination with Iona Beard.  I have also discussed any further diagnostic evaluation thatmay be needed or ordered today. Gregori verbalizes understanding of the findings of todays visit. We also reviewed his medications today and discussed drug interactions and side effects including but not limited excessive drowsiness and altered mental states. We also discussed that there is always a risk not just to him but also people around him. he has been encouraged to call the office with any questions or concerns that should arise related to todays visit.  No orders of the defined types were placed in this encounter.   I have personally obtained a history, examined the patient, evaluated laboratory and imaging results, formulated the assessment and plan and placed orders.  This patient was seen by Theodoro Grist AGNP-C in Collaboration with Dr. Devona Konig as a part of  collaborative care agreement.  Richelle Ito Saunders Glance, PhD, FAASM  Diplomate, American Board of Sleep Medicine    Allyne Gee, MD [facility] Diplomate ABMS Pulmonary and Critical Care Medicine Sleep medicine

## 2020-04-15 ENCOUNTER — Telehealth: Payer: Self-pay | Admitting: Podiatry

## 2020-04-15 NOTE — Telephone Encounter (Signed)
Pt called upset stating he went to [place] to pick up his shoes and Liliane Channel did not know anything about them. He said Liliane Channel reordered his shoes. He asked me to contact [place] before he left [place] office and figure it out and call him back.  I explained that I had left a message for pt to call to schedule an appt to pick them up in [place]. Pt thought I was getting puffy with him but I explained that I was not  I am just losing my voice.  I tried to call Liliane Channel and he did not answer and I also messaged him to call me about the pt.   I returned call and scheduled pt to see EJ next week in [place] to pick up the shoes.Pt said thank you.

## 2020-04-20 ENCOUNTER — Ambulatory Visit (INDEPENDENT_AMBULATORY_CARE_PROVIDER_SITE_OTHER): Payer: Medicare Other | Admitting: Dermatology

## 2020-04-20 ENCOUNTER — Encounter: Payer: Self-pay | Admitting: Dermatology

## 2020-04-20 ENCOUNTER — Other Ambulatory Visit: Payer: Self-pay

## 2020-04-20 DIAGNOSIS — L570 Actinic keratosis: Secondary | ICD-10-CM

## 2020-04-20 DIAGNOSIS — D1800 Hemangioma unspecified site: Secondary | ICD-10-CM

## 2020-04-20 DIAGNOSIS — L578 Other skin changes due to chronic exposure to nonionizing radiation: Secondary | ICD-10-CM

## 2020-04-20 DIAGNOSIS — Z1283 Encounter for screening for malignant neoplasm of skin: Secondary | ICD-10-CM | POA: Diagnosis not present

## 2020-04-20 DIAGNOSIS — D485 Neoplasm of uncertain behavior of skin: Secondary | ICD-10-CM | POA: Diagnosis not present

## 2020-04-20 DIAGNOSIS — D229 Melanocytic nevi, unspecified: Secondary | ICD-10-CM

## 2020-04-20 DIAGNOSIS — L720 Epidermal cyst: Secondary | ICD-10-CM

## 2020-04-20 DIAGNOSIS — D489 Neoplasm of uncertain behavior, unspecified: Secondary | ICD-10-CM

## 2020-04-20 DIAGNOSIS — L821 Other seborrheic keratosis: Secondary | ICD-10-CM

## 2020-04-20 DIAGNOSIS — L820 Inflamed seborrheic keratosis: Secondary | ICD-10-CM | POA: Diagnosis not present

## 2020-04-20 DIAGNOSIS — L814 Other melanin hyperpigmentation: Secondary | ICD-10-CM

## 2020-04-20 NOTE — Progress Notes (Signed)
Follow-Up Visit   Subjective  [name] Ferguson is a 79 y.o. male who presents for the following: UBSE and Actinic Keratosis (follow up on Cryotherapy on 11/07/19 on Scalp and [name] Ferguson). The patient presents for Upper Body Skin Exam (UBSE) for skin cancer screening and mole check. Patient presents today for USBE and  follow up on OV 11/07/19 where he received cryotherapy for AK on Scalp and forehead x 10. Patient also has a some areas of concern on his Left arms, Left shoulder, Left hip, and R scalp  The following portions of the chart were reviewed this encounter and updated as appropriate:  Tobacco  Allergies  Meds  Problems  Med Hx  Surg Hx  Fam Hx     Review of Systems:  No other skin or systemic complaints except as noted in HPI or Assessment and Plan.  Objective  Well appearing patient in no apparent distress; mood and affect are within normal limits.  All skin waist up examined.  Objective  Left Forearm x5 (5): Erythematous keratotic or waxy stuck-on papule or plaque.   Objective  Left Deltoid: Subcutaneous nodule.   Objective  Scalp x 6 (6): Erythematous thin papules/macules with gritty scale.   Objective  Left lateral waistline: 0.6 cm flesh color papule  Objective  Left lateral buttock: 1cm flesh color papule  Assessment & Plan  Inflamed seborrheic keratosis (5) Left Forearm x5  Cryotherapy today Prior to procedure, discussed risks of blister formation, small wound, skin dyspigmentation, or rare scar following cryotherapy.    Destruction of lesion - Left Forearm x5 Complexity: simple   Destruction method: cryotherapy   Informed consent: discussed and consent obtained   Timeout:  patient name, date of birth, surgical site, and procedure verified Lesion destroyed using liquid nitrogen: Yes   Region frozen until ice ball extended beyond lesion: Yes   Outcome: patient tolerated procedure well with no complications   Post-procedure details: wound care  instructions given    Epidermal cyst Left Deltoid  Will plan surgery for removal  AK (actinic keratosis) (6) Scalp x 6  Cryotherapy today Prior to procedure, discussed risks of blister formation, small wound, skin dyspigmentation, or rare scar following cryotherapy.    Destruction of lesion - Scalp x 6 Complexity: simple   Destruction method: cryotherapy   Informed consent: discussed and consent obtained   Timeout:  patient name, date of birth, surgical site, and procedure verified Lesion destroyed using liquid nitrogen: Yes   Region frozen until ice ball extended beyond lesion: Yes   Outcome: patient tolerated procedure well with no complications   Post-procedure details: wound care instructions given    Neoplasm of uncertain behavior (2) Left lateral waistline  Epidermal / dermal shaving  Lesion diameter (cm):  0.6 Informed consent: discussed and consent obtained   Timeout: patient name, date of birth, surgical site, and procedure verified   Procedure prep:  Patient was prepped and draped in usual sterile fashion Prep type:  Isopropyl alcohol Anesthesia: the lesion was anesthetized in a standard fashion   Anesthetic:  1% lidocaine w/ epinephrine 1-100,000 buffered w/ 8.4% NaHCO3 Instrument used: flexible razor blade   Hemostasis achieved with: pressure, aluminum chloride and electrodesiccation   Outcome: patient tolerated procedure well   Post-procedure details: sterile dressing applied and wound care instructions given   Dressing type: bandage and petrolatum    Specimen 1 - Surgical pathology Differential Diagnosis: Irritated nevus vs dysplasia Check Margins: No 0.6 cm flesh color papule  Left lateral  buttock  Epidermal / dermal shaving  Lesion diameter (cm):  1.1 Informed consent: discussed and consent obtained   Timeout: patient name, date of birth, surgical site, and procedure verified   Procedure prep:  Patient was prepped and draped in usual sterile  fashion Prep type:  Isopropyl alcohol Anesthesia: the lesion was anesthetized in a standard fashion   Anesthetic:  1% lidocaine w/ epinephrine 1-100,000 buffered w/ 8.4% NaHCO3 Instrument used: flexible razor blade   Hemostasis achieved with: pressure, aluminum chloride and electrodesiccation   Outcome: patient tolerated procedure well   Post-procedure details: sterile dressing applied and wound care instructions given   Dressing type: bandage and petrolatum    Specimen 2 - Surgical pathology Differential Diagnosis: Irritated nevus vs dysplasia Check Margins: No 1cm flesh color papule  Skin cancer screening   Lentigines - Scattered tan macules - Discussed due to sun exposure - Benign, observe - Call for any changes  Seborrheic Keratoses - Stuck-on, waxy, tan-brown papules and plaques  - Discussed benign etiology and prognosis. - Observe - Call for any changes  Melanocytic Nevi - Tan-brown and/or pink-flesh-colored symmetric macules and papules - Benign appearing on exam today - Observation - Call clinic for new or changing moles - Recommend daily use of broad spectrum spf 30+ sunscreen to sun-exposed areas.   Hemangiomas - Red papules - Discussed benign nature - Observe - Call for any changes  Actinic Damage - diffuse scaly erythematous macules with underlying dyspigmentation - Recommend daily broad spectrum sunscreen SPF 30+ to sun-exposed areas, reapply every 2 hours as needed.  - Call for new or changing lesions.  Skin cancer screening performed today.   Return in about 6 months (around 10/19/2020) for First available Surgery Cyst, AK Follow up.  Marene Lenz, CMA, am acting as scribe for [name], MD . Documentation: I have reviewed the above documentation for accuracy and completeness, and I agree with the above.  [name], MD

## 2020-04-20 NOTE — Patient Instructions (Addendum)
Recommend daily broad spectrum sunscreen SPF 30+ to sun-exposed areas, reapply every 2 hours as needed. Call for new or changing lesions.  Prior to procedure, discussed risks of blister formation, small wound, skin dyspigmentation, or rare scar following cryotherapy.  Liquid nitrogen was applied for 10-12 seconds to the skin lesion and the expected blistering or scabbing reaction explained. Do not pick at the area. Patient reminded to expect hypopigmented scars from the procedure. Return if lesion fails to fully resolve.  Cryotherapy Aftercare  . Wash gently with soap and water everyday.   Marland Kitchen Apply Vaseline and Band-Aid daily until healed.   Wound Care Instructions  1. Cleanse wound gently with soap and water once a day then pat dry with clean gauze. Apply a thing coat of Petrolatum (petroleum jelly, "Vaseline") over the wound (unless you have an allergy to this). We recommend that you use a new, sterile tube of Vaseline. Do not pick or remove scabs. Do not remove the yellow or white "healing tissue" from the base of the wound.  2. Cover the wound with fresh, clean, nonstick gauze and secure with paper tape. You may use Band-Aids in place of gauze and tape if the would is small enough, but would recommend trimming much of the tape off as there is often too much. Sometimes Band-Aids can irritate the skin.  3. You should call the office for your biopsy report after 1 week if you have not already been contacted.  4. If you experience any problems, such as abnormal amounts of bleeding, swelling, significant bruising, significant pain, or evidence of infection, please call the office immediately.  5. FOR ADULT SURGERY PATIENTS: If you need something for pain relief you may take 1 extra strength Tylenol (acetaminophen) AND 2 Ibuprofen (200mg  each) together every 4 hours as needed for pain. (do not take these if you are allergic to them or if you have a reason you should not take them.) Typically, you  may only need pain medication for 1 to 3 days.      Pre-Operative Instructions  You are scheduled for a surgical procedure at [facility]. We recommend you read the following instructions. If you have any questions or concerns, please call the office at [phone].  1. Shower and wash the entire body with soap and water the day of your surgery paying special attention to cleansing at and around the planned surgery site.  2. Avoid aspirin or aspirin containing products at least fourteen (14) days prior to your surgical procedure and for at least one week (7 Days) after your surgical procedure. If you take aspirin on a regular basis for heart disease or history of stroke or for any other reason, we may recommend you continue taking aspirin but please notify us if you take this on a regular basis. Aspirin can cause more bleeding to occur during surgery as well as prolonged bleeding and bruising after surgery.   3. Avoid other nonsteroidal pain medications at least one week prior to surgery and at least one week prior to your surgery. These include medications such as Ibuprofen (Motrin, Advil and Nuprin), Naprosyn, Voltaren, Relafen, etc. If medications are used for therapeutic reasons, please inform us as they can cause increased bleeding or prolonged bleeding during and bruising after surgical procedures.   4. Please advice [place] if you are taking any "blood thinner" medications such as Coumadin or Dipyridamole or Plavix or similar medications. These cause increased bleeding and prolonged bleeding during and bruising after surgical  procedures. We may have to consider discontinuing these medications briefly prior to and shortly after your surgery, if safe to do so.   5. Please inform us of all medications you are currently taking. All medications that are taken regularly should be taken the day of surgery as you always do. Nevertheless, we need to be informed of what medications you are taking  prior to surgery to whether they will affect the procedure or cause any complications.   6. Please inform us of any medication allergies. Also inform us of whether you have allergies to Latex or rubber products or whether you have had any adverse reaction to Lidocaine or Epinephrine.  7. Please inform us of any prosthetic or artificial body parts such as artificial heart valve, joint replacements, etc., or similar condition that might require preoperative antibiotics.   8. We recommend avoidance of alcohol at least two weeks prior to surgery and continued avoidence for at least two weeks after surgery.   9. We recommend discontinuation of tobacco smoking at least two weeks prior to surgery and continued abstinence for at least two weeks after surgery.  10. Do not plan strenuous exercise, strenuous work or strenuous lifting for approximately four weeks after your surgery.   11. We request if you are unable to make your scheduled surgical appointment, please call us at least a week in advance or as soon as you are aware of a problem sot aht we can cancel or reschedule you.   12. You MAKE TAKE TYLENOL (acetaminophen) for pain as it is not a blood thinner.   13. PLEASE PLAN TO BE IN TOWN FOR TWO WEEKS FOLLOWING SURGERY, THIS IS IMPORTANT SO YOU CAN BE CHECKED FOR DRESSING CHANGES, SUTURE REMOVAL AND TO MONITOR FOR POSSIBLE COMPLICATIONS.

## 2020-04-22 ENCOUNTER — Other Ambulatory Visit: Payer: Self-pay

## 2020-04-22 ENCOUNTER — Other Ambulatory Visit: Payer: Medicare Other | Admitting: Orthotics

## 2020-04-22 DIAGNOSIS — M2012 Hallux valgus (acquired), left foot: Secondary | ICD-10-CM | POA: Diagnosis not present

## 2020-04-22 DIAGNOSIS — M2041 Other hammer toe(s) (acquired), right foot: Secondary | ICD-10-CM

## 2020-04-22 DIAGNOSIS — M2042 Other hammer toe(s) (acquired), left foot: Secondary | ICD-10-CM

## 2020-04-22 DIAGNOSIS — M2011 Hallux valgus (acquired), right foot: Secondary | ICD-10-CM

## 2020-04-22 DIAGNOSIS — E1142 Type 2 diabetes mellitus with diabetic polyneuropathy: Secondary | ICD-10-CM | POA: Diagnosis not present

## 2020-04-23 ENCOUNTER — Telehealth: Payer: Self-pay

## 2020-04-23 NOTE — Telephone Encounter (Signed)
Patient informed of pathology results 

## 2020-04-23 NOTE — Telephone Encounter (Signed)
-----   Message from Ralene Bathe, MD sent at 04/22/2020  6:43 PM EDT ----- 1. Skin , left lateral waistline FIBROEPITHELIAL POLYP 2. Skin , left lateral buttock NEVUS LIPOMATOSUS SUPERFICIALIS  1&2 - both benign "moles" No further treatment

## 2020-05-14 ENCOUNTER — Other Ambulatory Visit: Payer: Self-pay

## 2020-05-14 ENCOUNTER — Encounter: Payer: Self-pay | Admitting: Podiatry

## 2020-05-14 ENCOUNTER — Ambulatory Visit (INDEPENDENT_AMBULATORY_CARE_PROVIDER_SITE_OTHER): Payer: Medicare Other | Admitting: Podiatry

## 2020-05-14 DIAGNOSIS — B351 Tinea unguium: Secondary | ICD-10-CM

## 2020-05-14 DIAGNOSIS — M79675 Pain in left toe(s): Secondary | ICD-10-CM | POA: Diagnosis not present

## 2020-05-14 DIAGNOSIS — E1142 Type 2 diabetes mellitus with diabetic polyneuropathy: Secondary | ICD-10-CM | POA: Diagnosis not present

## 2020-05-14 DIAGNOSIS — M79674 Pain in right toe(s): Secondary | ICD-10-CM

## 2020-05-14 DIAGNOSIS — D689 Coagulation defect, unspecified: Secondary | ICD-10-CM | POA: Diagnosis not present

## 2020-05-14 NOTE — Progress Notes (Signed)
This patient returns to my office for at risk foot care.  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  Patient is taking eliquiss.  This patient is unable to cut nails himself since the patient cannot reach his nails.These nails are painful walking and wearing shoes.  This patient presents for at risk foot care today.  General Appearance  Alert, conversant and in no acute stress.  Vascular  Dorsalis pedis and posterior tibial  pulses are palpable  bilaterally.  Capillary return is within normal limits  bilaterally. Temperature is within normal limits  bilaterally.  Neurologic  Senn-Weinstein monofilament wire test diminished  bilaterally. Muscle power within normal limits bilaterally.  Nails Thick disfigured discolored nails with subungual debris  from hallux to fifth toes bilaterally. No evidence of bacterial infection or drainage bilaterally.  Orthopedic  No limitations of motion  feet .  No crepitus or effusions noted.  No bony pathology or digital deformities noted.  HAV  B/L.  Hammer toes  B/L.  Skin  normotropic skin with no porokeratosis noted bilaterally.  No signs of infections or ulcers noted.     Onychomycosis  Pain in right toes  Pain in left toes  Consent was obtained for treatment procedures.   Mechanical debridement of nails 1-5  bilaterally performed with a nail nipper.  Filed with dremel without incident. Patient says he rescheduled his initial appointment.   Return office visit    10 weeks                 Told patient to return for periodic foot care and evaluation due to potential at risk complications.   Gardiner Barefoot DPM

## 2020-05-16 ENCOUNTER — Other Ambulatory Visit: Payer: Self-pay

## 2020-05-16 ENCOUNTER — Encounter: Payer: Self-pay | Admitting: Gynecology

## 2020-05-16 ENCOUNTER — Ambulatory Visit
Admission: EM | Admit: 2020-05-16 | Discharge: 2020-05-16 | Disposition: A | Discharge status: Home / Self Care | Payer: Medicare Other | Attending: Family Medicine | Admitting: Family Medicine

## 2020-05-16 ENCOUNTER — Ambulatory Visit (INDEPENDENT_AMBULATORY_CARE_PROVIDER_SITE_OTHER): Payer: Medicare Other

## 2020-05-16 DIAGNOSIS — S61216A Laceration without foreign body of right little finger without damage to nail, initial encounter: Secondary | ICD-10-CM | POA: Diagnosis not present

## 2020-05-16 DIAGNOSIS — S62392A Other fracture of third metacarpal bone, right hand, initial encounter for closed fracture: Secondary | ICD-10-CM | POA: Diagnosis not present

## 2020-05-16 DIAGNOSIS — S61411A Laceration without foreign body of right hand, initial encounter: Secondary | ICD-10-CM | POA: Diagnosis not present

## 2020-05-16 DIAGNOSIS — M25441 Effusion, right hand: Secondary | ICD-10-CM

## 2020-05-16 MED ORDER — HYDROCODONE-ACETAMINOPHEN 5-325 MG PO TABS
1.0000 | ORAL_TABLET | Freq: Three times a day (TID) | ORAL | 0 refills | Status: DC | PRN
Start: 2020-05-16 — End: 2023-10-05

## 2020-05-16 NOTE — Discharge Instructions (Signed)
Rest. Elevation.  Pain medication as needed.  Sutures out in 7 days.  Please call [place] [phone]) OR [facility] [phone]) for an appt.

## 2020-05-16 NOTE — ED Triage Notes (Signed)
Per patient fell at curbside while at [facility]  Today. Patient with laceration to his right pinky finger and right hand/shoulder pain.

## 2020-05-16 NOTE — ED Provider Notes (Signed)
[facility]    CSN: [id number] Arrival date & time: 05/16/20  1030      History   Chief Complaint Chief Complaint  Patient presents with  . Laceration  . Fall   HPI  79 year old male presents for evaluation the above.  Patient suffered a fall this morning at [place].  [place] off of a curb.  Patient is on Eliquis and Plavix.  Denies head injury.  Has no neck pain or headache at this time.  He has some abrasions to his fingers of his right hand.  Patient also has a laceration on the palmar aspect of the right hand below the fifth MCP joint.  He reports swelling of the dorsum of the right hand as well.  Also notes mild right shoulder pain.  Pain 7/10 in severity.  No other associated symptoms.  No other complaints.  Past Medical History:  Diagnosis Date  . Actinic keratosis   . Angina pectoris ([place])   . Atherosclerosis with limb claudication ([place])   . BPH (benign prostatic hypertrophy) with urinary obstruction   . CHF (congestive heart failure) ([place])   . Diabetes mellitus without complication ([place])   . HLD (hyperlipidemia)   . Hypertension   . Obesity   . Penile adhesions   . Sleep apnea   . SOB (shortness of breath)   . 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 SCCis  . Squamous cell carcinoma of skin 01/22/2018   Left proximal dorsum forearm, SCCis associated with verruca.  . Stroke [facility])     Patient Active Problem List   Diagnosis Date Noted  . Automatic implantable cardioverter-defibrillator in situ 09/09/2019  . Abnormal TSH 06/26/2019  . History of cardiac arrest 04/10/2019  . Stage 3a chronic kidney disease ([place]) 04/10/2019  . Coagulation disorder ([place]) 03/28/2019  . Cardiac arrest with ventricular fibrillation ([place]) 03/18/2019  . Moderate right ventricular systolic dysfunction 89/37/3428  . Pain due to onychomycosis of toenails of both feet 01/17/2019  . Diabetic neuropathy ([place]) 01/17/2019  . On continuous oral  anticoagulation 12/24/2018  . Paroxysmal atrial fibrillation ([place]) 12/24/2018  . Primary open angle glaucoma of both eyes, severe stage 12/04/2018  . Pseudophakia of both eyes 12/04/2018  . Vision changes 12/04/2018  . Stroke ([place]) 06/26/2017  . Intramuscular hematoma 04/13/2017  . Acute on chronic diastolic heart failure ([place]) 10/25/2016  . OSA on CPAP 10/25/2016  . Chest pain, atypical 10/24/2016  . Shortness of breath 10/24/2016  . Bacteremia due to Gram-negative bacteria 06/07/2016  . Hypokalemia 06/07/2016  . Hypomagnesemia 06/07/2016  . Diarrhea, unspecified 06/06/2016  . Thrombocytopenia ([place]) 06/06/2016  . Sepsis due to urinary tract infection ([place]) 06/05/2016  . BPH with obstruction/lower urinary tract symptoms 09/22/2015  . Special screening for malignant neoplasms, colon   . Rectal polyp   . Adiposity 03/18/2014  . Heart failure ([place]) 11/07/2013  . Type 2 diabetes mellitus treated with insulin ([place]) 04/08/2011  . HLD (hyperlipidemia) 04/08/2011  . Arthritis of knee, degenerative 04/08/2011  . Diabetes ([place]) 05/15/2007  . HYPERLIPIDEMIA 05/15/2007  . Essential hypertension 05/15/2007  . Coronary atherosclerosis 05/15/2007  . DYSPNEA ON EXERTION 05/15/2007  . TOBACCO ABUSE, HX OF 05/15/2007  . CORONARY ARTERY BYPASS GRAFT, HX OF 05/15/2007    Past Surgical History:  Procedure Laterality Date  . BACK SURGERY    . CARDIAC CATHETERIZATION    . COLONOSCOPY    . COLONOSCOPY WITH PROPOFOL N/A 09/22/2015   Procedure: COLONOSCOPY WITH PROPOFOL;  Surgeon: Lucilla Lame, MD;  Location: [facility] ENDOSCOPY;  Service: Endoscopy;  Laterality: N/A;  . CORONARY ARTERY BYPASS GRAFT  1996  . HEMORRHOID SURGERY  2007  . HEMORRHOID SURGERY  08-12-15   excision internal hemorrhoid Dr Jamal Collin  . JOINT REPLACEMENT Left 2004   Knee       Home Medications    Prior to Admission medications   Medication Sig Start Date End Date Taking? Authorizing Provider  acyclovir (ZOVIRAX) 400 MG  tablet TK 1 T PO TID FOR 5 DAYS FOR EACH FEVER BLISTER OUTBREAK 01/24/19  Yes [provider]  amiodarone (PACERONE) 200 MG tablet Take by mouth. 02/24/20 02/23/21 Yes [provider]  amoxicillin (AMOXIL) 500 MG capsule Before dental procedures 09/28/16  Yes [provider]  Aspirin-Calcium Carbonate 81-777 MG TABS Take by mouth.   Yes [provider]  Besifloxacin HCl (BESIVANCE) 0.6 % SUSP Apply to eye. 09/29/17  Yes [provider]  Blood Glucose Monitoring Suppl (ONETOUCH VERIO IQ SYSTEM) w/Device KIT U UTD TO CHECK BS QID 01/18/17  Yes [provider]  brimonidine (ALPHAGAN) 0.15 % ophthalmic solution Place 1 drop into the left eye 2 (two) times daily. 10/05/19  Yes [provider]  Brinzolamide-Brimonidine (SIMBRINZA) 1-0.2 % SUSP Apply 1 drop to eye 2 (two) times daily.   Yes [provider]  cefdinir (OMNICEF) 300 MG capsule Take 300 mg by mouth 2 (two) times daily. 07/03/19  Yes [provider]  clopidogrel (PLAVIX) 75 MG tablet 1 tab by mouth daily 12/24/08  Yes [provider]  dorzolamide (TRUSOPT) 2 % ophthalmic solution Administer 2 drops to both eyes Two (2) times a day. 02/27/15  Yes [provider]  ELIQUIS 5 MG TABS tablet  10/10/18  Yes [provider]  ezetimibe (ZETIA) 10 MG tablet Take 1 tablet by mouth daily. 01/14/20  Yes [provider]  FLUAD 0.5 ML SUSY TO BE ADMINISTERED BY PHARMACIST FOR IMMUNIZATION 03/06/18  Yes [provider]  fluticasone (FLONASE) 50 MCG/ACT nasal spray U 1 SPR NASALLY BID 08/12/16  Yes [provider]  glucagon 1 MG injection Use as directed for emergent low blood sugar, 06/26/19  Yes [provider]  Glucosamine HCl 1500 MG TABS  03/16/19  Yes [provider]  glucosamine-chondroitin 500-400 MG tablet Take 1 tablet by mouth daily.   Yes [provider]  glucose blood (ONETOUCH VERIO) test strip TEST 3  TIMES DAILY 01/27/18  Yes [provider]  Insulin Detemir (LEVEMIR FLEXTOUCH) 100 UNIT/ML Pen Inject 35-40 Units into the skin at bedtime. Says 50 units is prescribed but will usually take 35-40 at night   Yes [provider]  insulin glargine (LANTUS SOLOSTAR) 100 UNIT/ML Solostar Pen Inject into the skin. 01/08/20  Yes [provider]  Insulin Pen Needle (PEN NEEDLES 31GX5/16") 31G X 8 MM MISC as directed 11/24/10  Yes [provider]  INVOKANA 100 MG TABS tablet  03/27/19  Yes [provider]  isosorbide mononitrate (IMDUR) 30 MG 24 hr tablet Take 30 mg by mouth daily.  06/10/13  Yes [provider]  KLOR-CON M20 20 MEQ tablet Take 20 mEq by mouth 2 (two) times daily. 11/16/17  Yes [provider]  latanoprost (XALATAN) 0.005 % ophthalmic solution  01/06/18  Yes [provider]  levothyroxine (SYNTHROID) 50 MCG tablet PLEASE SEE ATTACHED FOR DETAILED DIRECTIONS 08/22/19  Yes [provider]  lisinopril (ZESTRIL) 10 MG tablet Take 10 mg by mouth every  morning. 01/10/20  Yes [provider]  metFORMIN (GLUCOPHAGE) 1000 MG tablet 1,000 mg 2 (two) times daily with a meal.  06/10/13  Yes [provider]  metoprolol succinate (TOPROL-XL) 25 MG 24 hr tablet TAKE 1/2 TABLET BY MOUTH EVERY DAY 01/02/18  Yes [provider]  Multiple Vitamin (MULTI-VITAMIN) tablet Take by mouth.   Yes [provider]  Multiple Vitamins-Minerals (MULTIVITAMIN ADULT PO) Take by mouth.   Yes [provider]  nitroGLYCERIN (NITROSTAT) 0.4 MG SL tablet PLACE 1 TABLET UNDER THE TONGUE EVERY 5 MINUTES AS NEEDED 11/18/15  Yes [provider]  Omega-3 Fatty Acids (FISH OIL) 1000 MG CAPS Take 4 capsules by mouth 2 (two) times daily. 4 capsules in morning and one at night   Yes [provider]  Omega-3 Fatty Acids (OMEGA-3 FISH OIL PO) Take by mouth.   Yes [provider]  [facility] DELICA LANCETS  70J MISC USE 1 LANCET 3 TIMES A DAY 02/10/18  Yes [provider]  pravastatin (PRAVACHOL) 40 MG tablet TAKE 1 TABLET BY MOUTH EVERY EVENING 10/09/17  Yes [provider]  prednisoLONE acetate (PRED FORTE) 1 % ophthalmic suspension PLACE 1 DROP INTO THE RIGHT EYE 6 (SIX) TIMES DAILY 02/18/19  Yes [provider]  REPATHA SURECLICK 500 MG/ML SOAJ Inject 1 mL into the skin every 14 (fourteen) days. 02/04/20  Yes [provider]  tamsulosin (FLOMAX) 0.4 MG CAPS capsule TAKE 1 CAPSULE BY MOUTH EVERY DAY 10/23/19  Yes McGowan, Larene Beach A, PA-C  torsemide (DEMADEX) 20 MG tablet Take by mouth. 02/04/20 02/03/21 Yes [provider]  TRADJENTA 5 MG TABS tablet  12/30/17  Yes [provider]  triamcinolone cream (KENALOG) 0.1 % Apply topically. 09/22/16  Yes [provider]  UNABLE TO FIND Take by mouth.   Yes [provider]  vitamin B-12 (CYANOCOBALAMIN) 500 MCG tablet Take 500 mcg by mouth daily.   Yes [provider]  vitamin C (ASCORBIC ACID) 500 MG tablet Take 500 mg by mouth daily.   Yes [provider]  HYDROcodone-acetaminophen (NORCO/VICODIN) 5-325 MG tablet Take 1 tablet by mouth every 8 (eight) hours as needed. 05/16/20   Coral Spikes, DO    Family History Family History  Problem Relation Age of Onset  . Diabetes Mother   . Alzheimer's disease Mother   . Cancer Sister   . Heart disease Other        Family members in general  . Diabetes Brother   . Prostate cancer Neg Hx   . Bladder Cancer Neg Hx   . Kidney disease Neg Hx     Social History Social History   Tobacco Use  . Smoking status: Former Smoker    Quit date: 07/11/1981    Years since quitting: 38.8  . Smokeless tobacco: Never Used  Substance Use Topics  . Alcohol use: Yes    Alcohol/week: 0.0 standard drinks    Comment: occasionally  . Drug use: No     Allergies   Statins   Review of Systems Review of Systems  Musculoskeletal:        Right hand pain.  Skin: Positive for wound.   Physical Exam Triage Vital Signs ED Triage Vitals  Enc Vitals Group     BP 05/16/20 1046 (!) 141/78     Pulse Rate 05/16/20 1046 66     Resp 05/16/20 1046 16     Temp 05/16/20 1046 98 F (36.7 C)  Temp Source 05/16/20 1046 Oral     SpO2 05/16/20 1046 98 %     Weight 05/16/20 1048 228 lb (103.4 kg)     Height --      Head Circumference --      Peak Flow --      Pain Score 05/16/20 1045 7     Pain Loc --      Pain Edu? --      Excl. in [place]? --    Updated Vital Signs BP (!) 141/78 (BP Location: Left Arm)   Pulse 66   Temp 98 F (36.7 C) (Oral)   Resp 16   Wt 103.4 kg   SpO2 98%   BMI 35.71 kg/m   Visual Acuity Right Eye Distance:   Left Eye Distance:   Bilateral Distance:    Right Eye Near:   Left Eye Near:    Bilateral Near:     Physical Exam Vitals and nursing note reviewed.  Constitutional:      General: He is not in acute distress.    Appearance: Normal appearance. He is not ill-appearing.  HENT:     Head: Normocephalic and atraumatic.  Cardiovascular:     Rate and Rhythm: Normal rate and regular rhythm.  Pulmonary:     Effort: Pulmonary effort is normal. No respiratory distress.     Breath sounds: Normal breath sounds.  Musculoskeletal:     Comments: Right hand -marked swelling noted on the dorsum of the hand particularly around the third and fourth MCP joints.  Patient has 2 small abrasions to the third and fourth digits.  Linear laceration noted on the palmar aspect of the hand below the fifth MCP joint.  Neurological:     Mental Status: He is alert.  Psychiatric:        Mood and Affect: Mood normal.        Behavior: Behavior normal.    UC Treatments / Results  Labs (all labs ordered are listed, but only abnormal results are displayed) Labs Reviewed - No data to display  EKG   Radiology DG Hand Complete Right  Result Date: 05/16/2020 CLINICAL DATA:  Status post fall. Laceration to fifth  digit and swelling throughout the metacarpal bones. EXAM: RIGHT HAND - COMPLETE 3+ VIEW COMPARISON:  None FINDINGS: Marked dorsal soft tissue swelling is identified centered over the metacarpal phalangeal joints. There is a deformity involving the head of the third metacarpal bone. On the lateral radiograph there is an ossified density along the dorsal aspect of the head of the metatarsal bones which may reflect a displaced fracture fragment. Mild degenerative changes are noted within the DIP joints. IMPRESSION: 1. Marked dorsal soft tissue swelling. Deformity of the head of the third metatarsal bone is noted with suspicious ossific density along the dorsal aspect of the metacarpal heads, which is concerning for a displaced fracture fragment. 2. Osteoarthritis of the DIP joints. Electronically Signed   By: Kerby Moors M.D.   On: 05/16/2020 11:32    Procedures Laceration Repair  Date/Time: 05/16/2020 12:44 PM Performed by: Coral Spikes, DO Authorized by: Coral Spikes, DO   Consent:    Consent obtained:  Verbal   Consent given by:  Patient Anesthesia (see MAR for exact dosages):    Anesthesia method:  Local infiltration   Local anesthetic:  Lidocaine 1% WITH epi Laceration details:    Location:  Hand   Hand location:  R palm   Length (cm):  2 Repair type:  Repair type:  Simple Pre-procedure details:    Preparation:  Patient was prepped and draped in usual sterile fashion Exploration:    Hemostasis achieved with:  Direct pressure   Contaminated: no   Treatment:    Area cleansed with:  Betadine   Irrigation solution:  Sterile water   Irrigation method:  Syringe Skin repair:    Repair method:  Staples and sutures   Suture size:  5-0   Suture material:  Nylon   Number of sutures:  5 Approximation:    Approximation:  Close Post-procedure details:    Patient tolerance of procedure:  Tolerated well, no immediate complications   (including critical care time)  Medications  Ordered in UC Medications - No data to display  Initial Impression / Assessment and Plan / UC Course  I have reviewed the triage vital signs and the nursing notes.  Pertinent labs & imaging results that were available during my care of the patient were reviewed by me and considered in my medical decision making (see chart for details).    79 year old male presents for evaluation after suffering a fall.  Laceration repaired as above.  X-ray was obtained and revealed a displaced fracture of the head of the third metacarpal.  Placed in splint.  Hydrocodone as needed for pain.  Patient will need orthopedic follow-up.  Sutures out in 7 days.  Final Clinical Impressions(s) / UC Diagnoses   Final diagnoses:  Laceration of right hand without foreign body, initial encounter  Closed displaced fracture of other part of third metacarpal bone of right hand, initial encounter     Discharge Instructions     Rest. Elevation.  Pain medication as needed.  Sutures out in 7 days.  Please call [facility] [phone]) OR [facility] [phone]) for an appt.     ED Prescriptions    Medication Sig Dispense Auth. Provider   HYDROcodone-acetaminophen (NORCO/VICODIN) 5-325 MG tablet Take 1 tablet by mouth every 8 (eight) hours as needed. 10 tablet Thersa Salt G, DO     I have reviewed the PDMP during this encounter.   Coral Spikes, [place] 05/16/20 1246

## 2020-05-28 ENCOUNTER — Ambulatory Visit: Payer: Medicare Other | Admitting: Podiatry

## 2020-06-11 DIAGNOSIS — I472 Ventricular tachycardia, unspecified: Secondary | ICD-10-CM | POA: Insufficient documentation

## 2020-06-11 DIAGNOSIS — Z7901 Long term (current) use of anticoagulants: Secondary | ICD-10-CM | POA: Insufficient documentation

## 2020-06-16 ENCOUNTER — Other Ambulatory Visit: Payer: Self-pay

## 2020-06-16 ENCOUNTER — Ambulatory Visit (INDEPENDENT_AMBULATORY_CARE_PROVIDER_SITE_OTHER): Payer: Medicare Other | Admitting: Dermatology

## 2020-06-16 ENCOUNTER — Telehealth: Payer: Self-pay

## 2020-06-16 DIAGNOSIS — L720 Epidermal cyst: Secondary | ICD-10-CM

## 2020-06-16 DIAGNOSIS — D492 Neoplasm of unspecified behavior of bone, soft tissue, and skin: Secondary | ICD-10-CM

## 2020-06-16 MED ORDER — MUPIROCIN 2 % EX OINT: 1.0000 "application " | TOPICAL_OINTMENT | Freq: Every day | CUTANEOUS | 0 refills | Status: DC

## 2020-06-16 NOTE — Patient Instructions (Signed)

## 2020-06-16 NOTE — Telephone Encounter (Signed)
Patient doing fine after today's surgery./sh 

## 2020-06-16 NOTE — Progress Notes (Signed)
   Follow-Up Visit   Subjective  [name] Ferguson is a 79 y.o. male who presents for the following: Cyst (L deltoid, pt presents for excision today).  The following portions of the chart were reviewed this encounter and updated as appropriate:   Tobacco  Allergies  Meds  Problems  Med Hx  Surg Hx  Fam Hx     Review of Systems:  No other skin or systemic complaints except as noted in HPI or Assessment and Plan.  Objective  Well appearing patient in no apparent distress; mood and affect are within normal limits.  A focused examination was performed including left arm. Relevant physical exam findings are noted in the Assessment and Plan.  Objective  Left lat deltoid: 0.5 cm cystic papule   Assessment & Plan  Neoplasm of skin Left lat deltoid  Skin excision  Lesion length (cm):  0.5 Lesion width (cm):  0.5 Margin per side (cm):  0.2 Total excision diameter (cm):  0.9 Informed consent: discussed and consent obtained   Timeout: patient name, date of birth, surgical site, and procedure verified   Procedure prep:  Patient was prepped and draped in usual sterile fashion Prep type:  Isopropyl alcohol and povidone-iodine Anesthesia: the lesion was anesthetized in a standard fashion   Anesthetic:  1% lidocaine w/ epinephrine 1-100,000 buffered w/ 8.4% NaHCO3 (8cc) Instrument used: #15 blade   Hemostasis achieved with: pressure   Hemostasis achieved with comment:  Electrocautery Outcome: patient tolerated procedure well with no complications   Post-procedure details: sterile dressing applied and wound care instructions given   Dressing type: bandage and pressure dressing (Mupirocin)    Skin repair Complexity:  Complex Final length (cm):  3 Reason for type of repair: reduce tension to allow closure, reduce the risk of dehiscence, infection, and necrosis, reduce subcutaneous dead space and avoid a hematoma, allow closure of the large defect, preserve normal anatomy, preserve  normal anatomical and functional relationships and enhance both functionality and cosmetic results   Undermining: area extensively undermined   Undermining comment:  Undermining Defect 0.9 cm Subcutaneous layers (deep stitches):  Suture size:  2-0 Suture type: Vicryl (polyglactin 910)   Subcutaneous suture technique: Inverted Dermal. Fine/surface layer approximation (top stitches):  Suture size:  3-0 Stitches: horizontal mattress   Suture removal (days):  7 Hemostasis achieved with: pressure Outcome: patient tolerated procedure well with no complications   Post-procedure details: sterile dressing applied and wound care instructions given   Dressing type: bandage, pressure dressing and bacitracin (Mupirocin)    mupirocin ointment (BACTROBAN) 2 %  Specimen 1 - Surgical pathology Differential Diagnosis: D48.5 Cyst vs other Check Margins: No 0.5 cm cystic papule  Return in about 1 week (around 06/23/2020) for suture removal.   I, Othelia Pulling, RMA, am acting as scribe for [name], MD .  Documentation: I have reviewed the above documentation for accuracy and completeness, and I agree with the above.  [name], MD

## 2020-06-22 ENCOUNTER — Encounter: Payer: Self-pay | Admitting: Dermatology

## 2020-06-23 ENCOUNTER — Ambulatory Visit: Payer: PRIVATE HEALTH INSURANCE | Admitting: Dermatology

## 2020-06-24 ENCOUNTER — Ambulatory Visit (INDEPENDENT_AMBULATORY_CARE_PROVIDER_SITE_OTHER): Payer: Medicare Other | Admitting: Dermatology

## 2020-06-24 ENCOUNTER — Other Ambulatory Visit: Payer: Self-pay

## 2020-06-24 DIAGNOSIS — L720 Epidermal cyst: Secondary | ICD-10-CM

## 2020-06-24 DIAGNOSIS — Z4802 Encounter for removal of sutures: Secondary | ICD-10-CM

## 2020-06-24 NOTE — Progress Notes (Signed)
   Follow-Up Visit   Subjective  [name] Ferguson is a 79 y.o. male who presents for the following: Suture / Staple Removal (1 week f/u biopsy proven Cyst L lat deltoid, pt here for suture removal ).  The following portions of the chart were reviewed this encounter and updated as appropriate:   Tobacco  Allergies  Meds  Problems  Med Hx  Surg Hx  Fam Hx     Review of Systems:  No other skin or systemic complaints except as noted in HPI or Assessment and Plan.  Objective  Well appearing patient in no apparent distress; mood and affect are within normal limits.  A focused examination was performed including L deltoid . Relevant physical exam findings are noted in the Assessment and Plan.  Objective  Left lat deltoid: Well healed scar    Assessment & Plan  Epidermal inclusion cyst Left lat deltoid  Biopsy proven Cyst   Encounter for Removal of Sutures - Incision site at the L lat deltoid is clean, dry and intact - Wound cleansed, sutures removed, wound cleansed and steri strips applied.  - Discussed pathology results showing Cyst  - Patient advised to keep steri-strips dry until they fall off. - Scars remodel for a full year. - Once steri-strips fall off, patient can apply over-the-counter silicone scar cream each night to help with scar remodeling if desired. - Patient advised to call with any concerns or if they notice any new or changing lesions.   No follow-ups on file.  [name], CMA, am acting as scribe for [name], MD .  Documentation: I have reviewed the above documentation for accuracy and completeness, and I agree with the above.  [name], MD

## 2020-06-30 ENCOUNTER — Encounter: Payer: Self-pay | Admitting: Dermatology

## 2020-07-27 ENCOUNTER — Ambulatory Visit: Payer: Medicare Other | Admitting: Podiatry

## 2020-07-30 ENCOUNTER — Ambulatory Visit (INDEPENDENT_AMBULATORY_CARE_PROVIDER_SITE_OTHER): Payer: Medicare Other | Admitting: Podiatry

## 2020-07-30 ENCOUNTER — Other Ambulatory Visit: Payer: Self-pay

## 2020-07-30 ENCOUNTER — Encounter: Payer: Self-pay | Admitting: Podiatry

## 2020-07-30 DIAGNOSIS — M2010 Hallux valgus (acquired), unspecified foot: Secondary | ICD-10-CM

## 2020-07-30 DIAGNOSIS — B351 Tinea unguium: Secondary | ICD-10-CM

## 2020-07-30 DIAGNOSIS — M2041 Other hammer toe(s) (acquired), right foot: Secondary | ICD-10-CM

## 2020-07-30 DIAGNOSIS — M2042 Other hammer toe(s) (acquired), left foot: Secondary | ICD-10-CM

## 2020-07-30 DIAGNOSIS — D689 Coagulation defect, unspecified: Secondary | ICD-10-CM | POA: Diagnosis not present

## 2020-07-30 DIAGNOSIS — M79674 Pain in right toe(s): Secondary | ICD-10-CM

## 2020-07-30 DIAGNOSIS — E1142 Type 2 diabetes mellitus with diabetic polyneuropathy: Secondary | ICD-10-CM | POA: Diagnosis not present

## 2020-07-30 DIAGNOSIS — M79675 Pain in left toe(s): Secondary | ICD-10-CM

## 2020-07-30 NOTE — Progress Notes (Signed)
This patient returns to my office for at risk foot care.  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  Patient is taking eliquiss.  This patient is unable to cut nails himself since the patient cannot reach his nails.These nails are painful walking and wearing shoes.  This patient presents for at risk foot care today.  General Appearance  Alert, conversant and in no acute stress.  Vascular  Dorsalis pedis and posterior tibial  pulses are palpable  bilaterally.  Capillary return is within normal limits  bilaterally. Temperature is within normal limits  bilaterally.  Neurologic  Senn-Weinstein monofilament wire test diminished  bilaterally. Muscle power within normal limits bilaterally.  Nails Thick disfigured discolored nails with subungual debris  from hallux to fifth toes bilaterally. No evidence of bacterial infection or drainage bilaterally.  Orthopedic  No limitations of motion  feet .  No crepitus or effusions noted.  No bony pathology or digital deformities noted.  HAV  B/L.  Hammer toes  B/L.  Skin  normotropic skin with no porokeratosis noted bilaterally.  No signs of infections or ulcers noted.     Onychomycosis  Pain in right toes  Pain in left toes  Consent was obtained for treatment procedures.   Mechanical debridement of nails 1-5  bilaterally performed with a nail nipper.  Filed with dremel without incident.    Return office visit    10 weeks                 Told patient to return for periodic foot care and evaluation due to potential at risk complications.   Gardiner Barefoot DPM

## 2020-10-08 ENCOUNTER — Other Ambulatory Visit: Payer: Self-pay | Admitting: *Deleted

## 2020-10-08 DIAGNOSIS — N401 Enlarged prostate with lower urinary tract symptoms: Secondary | ICD-10-CM

## 2020-10-08 MED ORDER — TAMSULOSIN HCL 0.4 MG PO CAPS: 0.4000 mg | ORAL_CAPSULE | Freq: Every day | ORAL | 1 refills | Status: DC

## 2020-10-22 ENCOUNTER — Ambulatory Visit (INDEPENDENT_AMBULATORY_CARE_PROVIDER_SITE_OTHER): Payer: Medicare Other | Admitting: Dermatology

## 2020-10-22 ENCOUNTER — Encounter: Payer: Self-pay | Admitting: Dermatology

## 2020-10-22 ENCOUNTER — Other Ambulatory Visit: Payer: Self-pay

## 2020-10-22 DIAGNOSIS — L578 Other skin changes due to chronic exposure to nonionizing radiation: Secondary | ICD-10-CM

## 2020-10-22 DIAGNOSIS — L570 Actinic keratosis: Secondary | ICD-10-CM

## 2020-10-22 NOTE — Patient Instructions (Signed)

## 2020-10-22 NOTE — Progress Notes (Signed)
   Follow-Up Visit   Subjective  [name] Ferguson is a 80 y.o. male who presents for the following: Actinic Keratosis (Scalp, 35m f/u) and check spots (Scalp, ~58m, no symptoms).  The following portions of the chart were reviewed this encounter and updated as appropriate:   Tobacco  Allergies  Meds  Problems  Med Hx  Surg Hx  Fam Hx     Review of Systems:  No other skin or systemic complaints except as noted in HPI or Assessment and Plan.  Objective  Well appearing patient in no apparent distress; mood and affect are within normal limits.  A focused examination was performed including face, scalp. Relevant physical exam findings are noted in the Assessment and Plan.  Objective  Scalp x 6 (6): Pink scaly macules    Assessment & Plan    Actinic Damage - Severe, confluent actinic changes with pre-cancerous actinic keratoses  - Severe, chronic, not at goal, secondary to cumulative UV radiation exposure over time - diffuse scaly erythematous macules and papules with underlying dyspigmentation - Discussed Prescription "Field Treatment" for Severe, Chronic Confluent Actinic Changes with Pre-Cancerous Actinic Keratoses Field treatment involves treatment of an entire area of skin that has confluent Actinic Changes (Sun/ Ultraviolet light damage) and PreCancerous Actinic Keratoses by method of PhotoDynamic Therapy (PDT) and/or prescription Topical Chemotherapy agents such as 5-fluorouracil, 5-fluorouracil/calcipotriene, and/or imiquimod.  The purpose is to decrease the number of clinically evident and subclinical PreCancerous lesions to prevent progression to development of skin cancer by chemically destroying early precancer changes that may or may not be visible.  It has been shown to reduce the risk of developing skin cancer in the treated area. As a result of treatment, redness, scaling, crusting, and open sores may occur during treatment course. One or more than one of these methods may be  used and may have to be used several times to control, suppress and eliminate the PreCancerous changes. Discussed treatment course, expected reaction, and possible side effects. - Recommend daily broad spectrum sunscreen SPF 30+ to sun-exposed areas, reapply every 2 hours as needed.  - Staying in the shade or wearing long sleeves, sun glasses (UVA+UVB protection) and wide brim hats (4-inch brim around the entire circumference of the hat) are also recommended. - Call for new or changing lesions. - Plan PDT with ALA to scalp/forehead in 1 month  AK (actinic keratosis) (6) Scalp x 6  Destruction of lesion - Scalp x 6 Complexity: simple   Destruction method: cryotherapy   Informed consent: discussed and consent obtained   Timeout:  patient name, date of birth, surgical site, and procedure verified Lesion destroyed using liquid nitrogen: Yes   Region frozen until ice ball extended beyond lesion: Yes   Outcome: patient tolerated procedure well with no complications   Post-procedure details: wound care instructions given    Return in about 1 month (around 11/21/2020) for PDT to scalp/forehead 2 hour incubation, then 62m UBSE with 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   I, Othelia Pulling, RMA, am acting as scribe for [name], MD .  Documentation: I have reviewed the above documentation for accuracy and completeness, and I agree with the above.  [name], MD

## 2020-10-27 ENCOUNTER — Encounter: Payer: Self-pay | Admitting: Dermatology

## 2020-10-29 ENCOUNTER — Encounter: Payer: Self-pay | Admitting: Podiatry

## 2020-10-29 ENCOUNTER — Other Ambulatory Visit: Payer: Self-pay

## 2020-10-29 ENCOUNTER — Ambulatory Visit (INDEPENDENT_AMBULATORY_CARE_PROVIDER_SITE_OTHER): Payer: Medicare Other | Admitting: Podiatry

## 2020-10-29 DIAGNOSIS — D689 Coagulation defect, unspecified: Secondary | ICD-10-CM

## 2020-10-29 DIAGNOSIS — B351 Tinea unguium: Secondary | ICD-10-CM | POA: Diagnosis not present

## 2020-10-29 DIAGNOSIS — M79675 Pain in left toe(s): Secondary | ICD-10-CM

## 2020-10-29 DIAGNOSIS — M79674 Pain in right toe(s): Secondary | ICD-10-CM

## 2020-10-29 DIAGNOSIS — M2010 Hallux valgus (acquired), unspecified foot: Secondary | ICD-10-CM

## 2020-10-29 DIAGNOSIS — M2042 Other hammer toe(s) (acquired), left foot: Secondary | ICD-10-CM

## 2020-10-29 DIAGNOSIS — M2041 Other hammer toe(s) (acquired), right foot: Secondary | ICD-10-CM

## 2020-10-29 DIAGNOSIS — E1142 Type 2 diabetes mellitus with diabetic polyneuropathy: Secondary | ICD-10-CM

## 2020-10-29 NOTE — Progress Notes (Signed)
This patient returns to my office for at risk foot care.  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  Patient is taking eliquiss.  This patient is unable to cut nails himself since the patient cannot reach his nails.These nails are painful walking and wearing shoes.  This patient presents for at risk foot care today.  General Appearance  Alert, conversant and in no acute stress.  Vascular  Dorsalis pedis and posterior tibial  pulses are palpable  bilaterally.  Capillary return is within normal limits  bilaterally. Temperature is within normal limits  bilaterally.  Neurologic  Senn-Weinstein monofilament wire test diminished  bilaterally. Muscle power within normal limits bilaterally.  Nails Thick disfigured discolored nails with subungual debris  from hallux to fifth toes bilaterally. No evidence of bacterial infection or drainage bilaterally.  Orthopedic  No limitations of motion  feet .  No crepitus or effusions noted.  No bony pathology or digital deformities noted.  HAV  B/L.  Hammer toes  B/L.  Skin  normotropic skin with no porokeratosis noted bilaterally.  No signs of infections or ulcers noted.     Onychomycosis  Pain in right toes  Pain in left toes  Consent was obtained for treatment procedures.   Mechanical debridement of nails 1-5  bilaterally performed with a nail nipper.  Filed with dremel without incident. Patient to make an appointment for diabetic shoes.   Return office visit    10 weeks                 Told patient to return for periodic foot care and evaluation due to potential at risk complications.   Gardiner Barefoot DPM

## 2020-11-18 ENCOUNTER — Other Ambulatory Visit: Payer: Self-pay

## 2020-11-18 ENCOUNTER — Ambulatory Visit: Payer: Medicare Other

## 2020-11-26 ENCOUNTER — Ambulatory Visit: Payer: PRIVATE HEALTH INSURANCE

## 2020-12-03 ENCOUNTER — Ambulatory Visit (INDEPENDENT_AMBULATORY_CARE_PROVIDER_SITE_OTHER): Payer: Medicare Other

## 2020-12-03 ENCOUNTER — Other Ambulatory Visit: Payer: Self-pay

## 2020-12-03 DIAGNOSIS — L570 Actinic keratosis: Secondary | ICD-10-CM | POA: Diagnosis not present

## 2020-12-03 MED ORDER — AMINOLEVULINIC ACID HCL 20 % EX SOLR
1.0000 "application " | Freq: Once | CUTANEOUS | Status: AC
  Administered 2020-12-03: 354 mg via TOPICAL

## 2020-12-03 NOTE — Progress Notes (Signed)
Patient completed PDT therapy today.  1. AK (actinic keratosis) Scalp  Photodynamic therapy - Scalp Procedure discussed: discussed risks, benefits, side effects. and alternatives   Prep: site scrubbed/prepped with acetone   Location:  Scalp and forehead Number of lesions:  Multiple Type of treatment:  Blue light Aminolevulinic Acid (see MAR for details): Levulan Number of Levulan sticks used:  1 Number of minutes under lamp:  16 Number of seconds under lamp:  40 Cooling:  Floor fan Outcome: patient tolerated procedure well with no complications   Post-procedure details: sunscreen applied    Aminolevulinic Acid HCl 20 % SOLR 354 mg - Scalp  Patient completed PDT therapy today.  1. AK (actinic keratosis) Scalp  Photodynamic therapy - Scalp  Aminolevulinic Acid HCl 20 % SOLR 354 mg - Scalp

## 2021-01-04 ENCOUNTER — Other Ambulatory Visit: Payer: Medicare Other

## 2021-01-04 ENCOUNTER — Other Ambulatory Visit: Payer: Self-pay

## 2021-01-04 DIAGNOSIS — R972 Elevated prostate specific antigen [PSA]: Secondary | ICD-10-CM

## 2021-01-08 ENCOUNTER — Ambulatory Visit: Payer: Medicare Other | Admitting: Urology

## 2021-01-18 ENCOUNTER — Other Ambulatory Visit: Payer: Medicare Other

## 2021-01-18 ENCOUNTER — Other Ambulatory Visit: Payer: Self-pay

## 2021-01-18 DIAGNOSIS — R972 Elevated prostate specific antigen [PSA]: Secondary | ICD-10-CM

## 2021-01-19 LAB — PSA: Prostate Specific Ag, Serum: 2.6 ng/mL (ref 0.0–4.0)

## 2021-01-22 ENCOUNTER — Ambulatory Visit: Payer: Medicare Other | Admitting: Urology

## 2021-02-01 ENCOUNTER — Telehealth: Payer: Self-pay | Admitting: Podiatry

## 2021-02-01 NOTE — Telephone Encounter (Signed)
Pt left 2 messages this morning checking on his diabetic shoes. He was already measured and picked them out. He stated he has had some issues the last couple of years getting them...  I returned call and confirmed the documents were signed by Dr Thayer Jew and that the issue is with the manufacturer this time but I have sent a message to the company to see if we can get them out asap. He said sorry for bothering me and I told pt he was not bothering me and to please call me back if he does not hear from me in a few weeks.

## 2021-02-01 NOTE — Progress Notes (Signed)
02/02/2021 11:29 AM   [name] Ferguson 1940-11-29 [id number]  Referring provider: Kirk Ruths, MD [facility] [place],  [place] [address]  Urological history: 1. BPH with LU TS -PSA 2.6 in 01/2021 -I PSS 5/2 -managed with tamsulosin 0.4 mg daily  2. Prostate nodule -contrast CT 2017 - Enlarged prostate with small internal low attenuating structure that is not well visualized by CT; differential considerations include, but are not limited to a BPH nodule or sequela of prostatitis with possible tiny non drainable fluid collection  - stable on exam   3. Elevated PSA -PSA Trend 2.0 ng/mL on 09/07/2015             7.34 ng/mL on 06/10/2016               Component     Latest Ref Rng & Units 06/27/2016 12/20/2016 12/26/2017 12/20/2018  Prostate Specific Ag, Serum     0.0 - 4.0 ng/mL 2.9 1.9 2.1 2.0   Component     Latest Ref Rng & Units 12/26/2019 01/18/2021  Prostate Specific Ag, Serum     0.0 - 4.0 ng/mL 2.1 2.6    Chief Complaint  Patient presents with   Benign Prostatic Hypertrophy    HPI: [name] Ferguson is a 80 y.o. male who presents today for yearly appointment.  He continues to have frequency and urgency which he attributes to the diuretics.    Patient denies any modifying or aggravating factors.  Patient denies any gross hematuria, dysuria or suprapubic/flank pain.  Patient denies any fevers, chills, nausea or vomiting.     IPSS     Row Name 02/02/21 1000         International Prostate Symptom Score   How often have you had the sensation of not emptying your bladder? Not at All     How often have you had to urinate less than every two hours? Less than half the time     How often have you found you stopped and started again several times when you urinated? Less than 1 in 5 times     How often have you found it difficult to postpone urination? Not at All     How often have you had a weak urinary stream? Less than 1 in 5  times     How often have you had to strain to start urination? Not at All     How many times did you typically get up at night to urinate? 1 Time     Total IPSS Score 5           Quality of Life due to urinary symptoms     If you were to spend the rest of your life with your urinary condition just the way it is now how would you feel about that? 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   Stroke [facility])     Surgical History: Past Surgical History:  Procedure Laterality Date   BACK SURGERY     CARDIAC CATHETERIZATION     COLONOSCOPY     COLONOSCOPY WITH PROPOFOL N/A 09/22/2015   Procedure: COLONOSCOPY WITH PROPOFOL;  Surgeon: Lucilla Lame, MD;  Location: [facility] ENDOSCOPY;  Service: Endoscopy;  Laterality: N/A;   CORONARY ARTERY BYPASS GRAFT  1996   HEMORRHOID SURGERY  2007   HEMORRHOID SURGERY  08-12-15   excision internal hemorrhoid Dr Jamal Collin   JOINT REPLACEMENT Left 2004   Knee    Home Medications:  Allergies as of 02/02/2021       Reactions   Statins Other (See Comments)   Suspected cause of muscle pain        Medication List        Accurate as of February 02, 2021 11:29 AM. If you have any questions, ask your nurse or doctor.          acyclovir 400 MG tablet Commonly known as: ZOVIRAX TK 1 T PO TID FOR 5 DAYS FOR EACH FEVER BLISTER  OUTBREAK   amiodarone 200 MG tablet Commonly known as: PACERONE Take by mouth.   amoxicillin 500 MG capsule Commonly known as: AMOXIL Before dental procedures   Aspirin-Calcium Carbonate 81-777 MG Tabs Take by mouth.   Besifloxacin HCl 0.6 % Susp Apply to eye.   brimonidine 0.15 % ophthalmic solution Commonly known as: ALPHAGAN Place 1 drop into the left eye 2 (two) times daily.   Brinzolamide-Brimonidine 1-0.2 % Susp Apply 1 drop to eye 2 (two) times daily.   cefdinir 300 MG capsule Commonly known as: OMNICEF Take 300 mg by mouth 2 (two) times daily.   clopidogrel 75 MG tablet Commonly known as: PLAVIX 1 tab by mouth daily   dorzolamide 2 % ophthalmic solution Commonly known as: TRUSOPT Administer 2 drops to both eyes Two (2) times a day.   Eliquis 5 MG Tabs tablet Generic drug: apixaban   ezetimibe 10 MG tablet Commonly known as: ZETIA Take 1 tablet by mouth daily.   Fish Oil 1000 MG Caps Take 4 capsules by mouth 2 (two) times daily. 4 capsules in morning and one at night   OMEGA-3 FISH OIL PO Take by mouth.   Fluad 0.5 ML Susy Generic drug: Influenza Vac A&B Surf Ant Adj TO BE ADMINISTERED BY PHARMACIST FOR IMMUNIZATION   fluticasone 50 MCG/ACT nasal spray Commonly known as: FLONASE U 1 SPR NASALLY BID   glucagon 1 MG injection Use as directed for emergent low blood sugar,   Glucosamine HCl 1500 MG Tabs   glucosamine-chondroitin 500-400 MG tablet Take 1 tablet by mouth daily.   glucose blood test strip TEST 3 TIMES DAILY   HYDROcodone-acetaminophen 5-325 MG tablet Commonly known as: NORCO/VICODIN Take 1 tablet by mouth every 8 (eight) hours as needed.   insulin detemir 100 UNIT/ML FlexPen Commonly known as: LEVEMIR Inject 35-40 Units into the skin at bedtime. Says 50 units is prescribed but will usually take 35-40 at night   Invokana 100 MG Tabs tablet Generic drug: canagliflozin   isosorbide mononitrate 30 MG 24 hr tablet Commonly  known as: IMDUR Take 30 mg by mouth daily.   Klor-Con M20 20 MEQ tablet Generic drug: potassium chloride SA Take  20 mEq by mouth 2 (two) times daily.   Lantus SoloStar 100 UNIT/ML Solostar Pen Generic drug: insulin glargine Inject into the skin.   latanoprost 0.005 % ophthalmic solution Commonly known as: XALATAN   levothyroxine 50 MCG tablet Commonly known as: SYNTHROID PLEASE SEE ATTACHED FOR DETAILED DIRECTIONS   lisinopril 10 MG tablet Commonly known as: ZESTRIL Take 10 mg by mouth every morning.   metFORMIN 1000 MG tablet Commonly known as: GLUCOPHAGE 1,000 mg 2 (two) times daily with a meal.   metoprolol succinate 25 MG 24 hr tablet Commonly known as: TOPROL-XL TAKE 1/2 TABLET BY MOUTH EVERY DAY   Multi-Vitamin tablet Take by mouth.   MULTIVITAMIN ADULT PO Take by mouth.   mupirocin ointment 2 % Commonly known as: BACTROBAN Apply 1 application topically daily. Qd to excision site   nitroGLYCERIN 0.4 MG SL tablet Commonly known as: NITROSTAT PLACE 1 TABLET UNDER THE TONGUE EVERY 5 MINUTES AS NEEDED   OneTouch Delica Lancets 27M Misc USE 1 LANCET 3 TIMES A DAY   OneTouch Verio IQ System w/Device Kit U UTD TO CHECK BS QID   PEN NEEDLES 31GX5/16" 31G X 8 MM Misc as directed   pravastatin 40 MG tablet Commonly known as: PRAVACHOL TAKE 1 TABLET BY MOUTH EVERY EVENING   prednisoLONE acetate 1 % ophthalmic suspension Commonly known as: PRED FORTE PLACE 1 DROP INTO THE RIGHT EYE 6 (SIX) TIMES DAILY   Repatha SureClick 184 MG/ML Soaj Generic drug: Evolocumab Inject 1 mL into the skin every 14 (fourteen) days.   tamsulosin 0.4 MG Caps capsule Commonly known as: FLOMAX Take 1 capsule (0.4 mg total) by mouth daily.   torsemide 20 MG tablet Commonly known as: DEMADEX Take by mouth.   Tradjenta 5 MG Tabs tablet Generic drug: linagliptin   triamcinolone cream 0.1 % Commonly known as: KENALOG Apply topically.   UNABLE TO FIND Take by mouth.    vitamin B-12 500 MCG tablet Commonly known as: CYANOCOBALAMIN Take 500 mcg by mouth daily.   vitamin C 500 MG tablet Commonly known as: ASCORBIC ACID Take 500 mg by mouth daily.        Allergies:  Allergies  Allergen Reactions   Statins Other (See Comments)    Suspected cause of muscle pain    Family History: Family History  Problem Relation Age of Onset   Diabetes Mother    Alzheimer's disease Mother    Cancer Sister    Heart disease Other        Family members in general   Diabetes Brother    Prostate cancer Neg Hx    Bladder Cancer Neg Hx    Kidney disease Neg Hx     Social History:  reports that he quit smoking about 39 years ago. His smoking use included cigarettes. He has never used smokeless tobacco. He reports current alcohol use. He reports that he does not use drugs.  ROS: Pertinent ROS in HPI  Physical Exam: BP 121/74   Pulse 83   Ht '5\' 7"'  (1.702 m)   Wt 232 lb (105.2 kg)   BMI 36.34 kg/m   Constitutional:  Well nourished. Alert and oriented, No acute distress. HEENT: [place] AT, mask in place  Trachea midline Cardiovascular: No clubbing, cyanosis, or edema. Respiratory: Normal respiratory effort, no increased work of breathing. GU: No CVA tenderness.  No bladder fullness or masses.  Patient with uncircumcised phallus.  Foreskin easily retracted  Urethral meatus is patent.  No penile discharge. No penile  lesions or rashes. Scrotum without lesions, cysts, rashes and/or edema.  Testicles are located scrotally bilaterally. No masses are appreciated in the testicles. Left and right epididymis are normal. Rectal: Patient with  normal sphincter tone. Anus and perineum without scarring or rashes. No rectal masses are appreciated. Prostate is approximately 60 + grams, no nodules are appreciated. Seminal vesicles could not be palpated.  Neurologic: Grossly intact, no focal deficits, moving all 4 extremities. Psychiatric: Normal mood and affect.  Laboratory  Data: [facility] SARS-COV-2 RNA, QL NAAT, RT PCR/TMA (COVID-19) Not Detected Not Detected   Comment: This nucleic acid amplification test was developed and its performance  characteristics determined by [employer]. Nucleic acid  amplification tests include RT-PCR and TMA. This test has not been  FDA cleared or approved. This test has been authorized by FDA under  an Emergency Use Authorization (EUA). This test is only authorized  for the duration of time the declaration that circumstances exist  justifying the authorization of the emergency use of in vitro  diagnostic tests for detection of SARS-CoV-2 virus and/or diagnosis  of COVID-19 infection under section 564(b)(1) of the Act, 21 U.S.C.  915AVW-9(V) (1), unless the authorization is terminated or revoked  sooner.  When diagnostic testing is negative, the possibility of a false  negative result should be considered in the context of a patient's  recent exposures and the presence of clinical signs and symptoms  consistent with COVID-19. An individual without symptoms of COVID-19  and who is not shedding SARS-CoV-2 virus would expect to have a  negative (not detected) result in this assay.  Resulting Agency  [facility]   Narrative Performed by [facility] Performed at:  [place]  [address] [place], [place]  [id number]  Lab Director: Rush Farmer MD, Phone:  [phone] Specimen Collected: 01/21/21 09:24 Last Resulted: 01/22/21 16:36  Received From: [facility]  Result Received: 02/01/21 09:12   Sodium 135 - 145 mmol/L 138   Potassium 3.5 - 5.0 mmol/L 4.5   Chloride 98 - 108 mmol/L 101   Carbon Dioxide (CO2) 21 - 30 mmol/L 29   Urea Nitrogen (BUN) 7 - 20 mg/dL 22 High    Creatinine 0.6 - 1.3 mg/dL 1.7 High    Glucose 70 - 140 mg/dL 191 High    Comment: Interpretive Data:  Above is the NONFASTING reference range.   Below are the FASTING reference ranges:  NORMAL:      70-99 mg/dL   PREDIABETES: 100-125 mg/dL  DIABETES:    > 125 mg/dL   Calcium 8.7 - 10.2 mg/dL 9.5   AST (Aspartate Aminotransferase) 15 - 41 U/L 35   ALT (Alanine Aminotransferase) 17 - 63 U/L 38   Bilirubin, Total 0.4 - 1.5 mg/dL 1.0   Alk Phos (Alkaline Phosphatase) 24 - 110 U/L 65   Albumin 3.5 - 4.8 g/dL 4.0   Protein, Total 6.2 - 8.1 g/dL 7.1   Anion Gap 3 - 12 mmol/L 8   BUN/CREA Ratio 6 - 27 13   Glomerular Filtration Rate (eGFR)  mL/min/1.73sq m 41   Comment: CKD-EPI (2021) does not include patient's race in the calculation of eGFR. Monitoring changes of plasma creatinine and eGFR over time is useful for monitoring kidney function.  This change was made on 09/08/2020.   Interpretive Ranges for eGFR(CKD-EPI 2021):   eGFR:              > 60 mL/min/1.73 sq m - Normal  eGFR:  72 - 59 mL/min/1.73 sq m - Moderately Decreased  eGFR:              15 - 29 mL/min/1.73 sq m - Severely Decreased  eGFR:              < 15 mL/min/1.73 sq m -  Kidney Failure    Note: These eGFR calculations do not apply in acute situations  when eGFR is changing rapidly or in patients on dialysis.   Resulting Agency  [facility]  Specimen Collected: 01/13/21 10:49 Last Resulted: 01/13/21 14:47  Received From: [place]  Result Received: 01/18/21 08:20   POC Hemoglobin A1C (Glycated) 4.3 - 6.0 % 8.1 High    Average Blood Glucose mg/dL 191   Comment: Interpretive Data   AVERAGE BLOOD GLUCOSE ASSOCIATED WITH THIS  PERCENT GLYCATED HEMOGLOBIN (BASED ON DCCT)  Resulting Agency  [place]   Narrative Performed by [facility] TESTING PROGRAM Interpretive Data   THE ADA HAS MADE THE FOLLOWING RECOMMENDATIONS   HBA1C results in the range of 5.7-6.4% are suggestive of prediabetes.  HBA1C results in the range of > or = 6.5% are diagnostic for diabetes.  POC TEST(S) ABOVE PERFORMED AT THE PATIENT CARE LOCATION AND OVERSEEN BY [facility] PO[facility]  PROGRAM. Specimen Collected: 11/18/20 08:41 Last Resulted: 11/18/20 09:08  Received From: [place]  Result Received: 11/18/20 11:13   PTH 18.4 - 80.1 pg/mL 28.3   Resulting Agency  [facility]ATOR[facility]  Specimen Collected: 10/01/20 15:33 Last Resulted: 10/01/20 19:56  Received From: [place]  Result Received: 10/08/20 11:13   WBC 3.6 - 11.2 10*9/L 6.3   RBC 4.26 - 5.60 10*12/L 4.91   HGB 12.9 - 16.5 g/dL 15.6   HCT 39.0 - 48.0 % 44.5   MCV 77.6 - 95.7 fL 90.6   MCH 25.9 - 32.4 pg 31.9   MCHC 32.0 - 36.0 g/dL 35.2   RDW 12.2 - 15.2 % 14.0   MPV 6.8 - 10.7 fL 9.1   Platelet 150 - 450 10*9/L 196   Resulting Agency  [facility] CLINICAL LABORATORIES  Specimen Collected: 10/01/20 15:33 Last Resulted: 10/01/20 18:38  Received From: [place]  Result Received: 10/08/20 11:13  I have reviewed the labs.   Pertinent Imaging: N/A  Assessment & Plan:    1. BPH with LU TS -continues to have issues with frequency and urgency but it is likely due to his diuretics -he is not wanting any evaluation at this time cited his age, co-morbidities and it is not likely his cardiologist would clear him to come off his two anticoagulants  -continue the tamsulosin 0.4 mg daily -script sent to pharmacy  Return in about 1 year (around 02/02/2022) for PSA, I PSS and exam .  These notes generated with voice recognition software. I apologize for typographical errors.  Zara Council, PA-C  [facility] [address]  [place], [place] [address] [phone]

## 2021-02-02 ENCOUNTER — Encounter: Payer: Self-pay | Admitting: Urology

## 2021-02-02 ENCOUNTER — Ambulatory Visit (INDEPENDENT_AMBULATORY_CARE_PROVIDER_SITE_OTHER): Payer: Medicare Other | Admitting: Urology

## 2021-02-02 ENCOUNTER — Other Ambulatory Visit: Payer: Self-pay

## 2021-02-02 VITALS — BP 121/74 | HR 83 | Ht 67.0 in | Wt 232.0 lb

## 2021-02-02 DIAGNOSIS — N401 Enlarged prostate with lower urinary tract symptoms: Secondary | ICD-10-CM

## 2021-02-02 MED ORDER — TAMSULOSIN HCL 0.4 MG PO CAPS: 0.4000 mg | ORAL_CAPSULE | Freq: Every day | ORAL | 3 refills | Status: DC

## 2021-02-04 ENCOUNTER — Other Ambulatory Visit: Payer: Self-pay

## 2021-02-04 ENCOUNTER — Encounter: Payer: Self-pay | Admitting: Podiatry

## 2021-02-04 ENCOUNTER — Ambulatory Visit (INDEPENDENT_AMBULATORY_CARE_PROVIDER_SITE_OTHER): Payer: Medicare Other | Admitting: Podiatry

## 2021-02-04 DIAGNOSIS — M79675 Pain in left toe(s): Secondary | ICD-10-CM | POA: Diagnosis not present

## 2021-02-04 DIAGNOSIS — E1142 Type 2 diabetes mellitus with diabetic polyneuropathy: Secondary | ICD-10-CM | POA: Diagnosis not present

## 2021-02-04 DIAGNOSIS — D689 Coagulation defect, unspecified: Secondary | ICD-10-CM

## 2021-02-04 DIAGNOSIS — B351 Tinea unguium: Secondary | ICD-10-CM

## 2021-02-04 DIAGNOSIS — M79674 Pain in right toe(s): Secondary | ICD-10-CM

## 2021-02-04 NOTE — Progress Notes (Signed)
This patient returns to my office for at risk foot care.  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  Patient is taking eliquiss.  This patient is unable to cut nails himself since the patient cannot reach his nails.These nails are painful walking and wearing shoes.  This patient presents for at risk foot care today.  General Appearance  Alert, conversant and in no acute stress.  Vascular  Dorsalis pedis and posterior tibial  pulses are palpable  bilaterally.  Capillary return is within normal limits  bilaterally. Temperature is within normal limits  bilaterally.  Neurologic  Senn-Weinstein monofilament wire test diminished  bilaterally. Muscle power within normal limits bilaterally.  Nails Thick disfigured discolored nails with subungual debris  from hallux to fifth toes bilaterally. No evidence of bacterial infection or drainage bilaterally.  Orthopedic  No limitations of motion  feet .  No crepitus or effusions noted.  No bony pathology or digital deformities noted.  HAV  B/L.  Hammer toes  B/L.  Skin  normotropic skin with no porokeratosis noted bilaterally.  No signs of infections or ulcers noted.     Onychomycosis  Pain in right toes  Pain in left toes  Consent was obtained for treatment procedures.   Mechanical debridement of nails 1-5  bilaterally performed with a nail nipper.  Filed with dremel without incident.    Return office visit    10 weeks                 Told patient to return for periodic foot care and evaluation due to potential at risk complications.   Gardiner Barefoot DPM

## 2021-02-24 ENCOUNTER — Other Ambulatory Visit: Payer: Self-pay

## 2021-02-24 ENCOUNTER — Encounter: Payer: Self-pay | Admitting: Podiatry

## 2021-02-24 ENCOUNTER — Ambulatory Visit (INDEPENDENT_AMBULATORY_CARE_PROVIDER_SITE_OTHER): Payer: Medicare Other | Admitting: Podiatry

## 2021-02-24 DIAGNOSIS — M2042 Other hammer toe(s) (acquired), left foot: Secondary | ICD-10-CM

## 2021-02-24 DIAGNOSIS — M2011 Hallux valgus (acquired), right foot: Secondary | ICD-10-CM

## 2021-02-24 DIAGNOSIS — M2041 Other hammer toe(s) (acquired), right foot: Secondary | ICD-10-CM

## 2021-02-24 DIAGNOSIS — M2010 Hallux valgus (acquired), unspecified foot: Secondary | ICD-10-CM

## 2021-02-24 DIAGNOSIS — E1142 Type 2 diabetes mellitus with diabetic polyneuropathy: Secondary | ICD-10-CM

## 2021-02-24 DIAGNOSIS — M2012 Hallux valgus (acquired), left foot: Secondary | ICD-10-CM

## 2021-02-24 NOTE — Progress Notes (Addendum)
The patient presented to the office to day to pick up diabetic shoes and 3 pr diabetic custom inserts.   1 pr of  inserts were put in the shoes and the shoes were fitted to the patient.  The patient states they are comfortable and free of defect. He was satisfied with the fit of the shoe.  Instructions for break in and wear were dispensed.  The patient signed the delivery documentation and break in instruction form.  If any questions or concerns arise, he is instructed to call.  Otherwise she will be seen back for her next scheduled appointment   Gardiner Barefoot DPM

## 2021-03-22 DIAGNOSIS — I447 Left bundle-branch block, unspecified: Secondary | ICD-10-CM | POA: Insufficient documentation

## 2021-03-23 DIAGNOSIS — I440 Atrioventricular block, first degree: Secondary | ICD-10-CM | POA: Insufficient documentation

## 2021-04-29 ENCOUNTER — Ambulatory Visit: Payer: Medicare Other | Admitting: Podiatry

## 2021-04-29 ENCOUNTER — Encounter: Payer: Self-pay | Admitting: Dermatology

## 2021-04-29 ENCOUNTER — Other Ambulatory Visit: Payer: Self-pay

## 2021-04-29 ENCOUNTER — Ambulatory Visit (INDEPENDENT_AMBULATORY_CARE_PROVIDER_SITE_OTHER): Payer: Medicare Other | Admitting: Dermatology

## 2021-04-29 DIAGNOSIS — D1801 Hemangioma of skin and subcutaneous tissue: Secondary | ICD-10-CM

## 2021-04-29 DIAGNOSIS — L719 Rosacea, unspecified: Secondary | ICD-10-CM | POA: Diagnosis not present

## 2021-04-29 DIAGNOSIS — Z85828 Personal history of other malignant neoplasm of skin: Secondary | ICD-10-CM

## 2021-04-29 DIAGNOSIS — D229 Melanocytic nevi, unspecified: Secondary | ICD-10-CM

## 2021-04-29 DIAGNOSIS — L578 Other skin changes due to chronic exposure to nonionizing radiation: Secondary | ICD-10-CM

## 2021-04-29 DIAGNOSIS — L814 Other melanin hyperpigmentation: Secondary | ICD-10-CM

## 2021-04-29 DIAGNOSIS — L820 Inflamed seborrheic keratosis: Secondary | ICD-10-CM

## 2021-04-29 DIAGNOSIS — Z1283 Encounter for screening for malignant neoplasm of skin: Secondary | ICD-10-CM

## 2021-04-29 DIAGNOSIS — Z872 Personal history of diseases of the skin and subcutaneous tissue: Secondary | ICD-10-CM

## 2021-04-29 DIAGNOSIS — D692 Other nonthrombocytopenic purpura: Secondary | ICD-10-CM

## 2021-04-29 DIAGNOSIS — L570 Actinic keratosis: Secondary | ICD-10-CM | POA: Diagnosis not present

## 2021-04-29 DIAGNOSIS — L821 Other seborrheic keratosis: Secondary | ICD-10-CM

## 2021-04-29 NOTE — Patient Instructions (Addendum)
-   Can use OTC arnica containing moisturizer such as Dermend Bruise Formula if desired for arms - Call for worsening or other concerns    Cryotherapy Aftercare  Wash gently with soap and water everyday.   Apply Vaseline and Band-Aid daily until healed.     If you have any questions or concerns for your doctor, please call our main line at [phone] and press option 4 to reach your doctor's medical assistant. If no one answers, please leave a voicemail as directed and we will return your call as soon as possible. Messages left after 4 pm will be answered the following business day.   You may also send [place] a message via [place]. We typically respond to MyChart messages within 1-2 business days.  For prescription refills, please ask your pharmacy to contact our office. Our fax number is [phone].  If you have an urgent issue when the clinic is closed that cannot wait until the next business day, you can page your doctor at the number below.    Please note that while we do our best to be available for urgent issues outside of office hours, we are not available 24/7.   If you have an urgent issue and are unable to reach [place], you may choose to seek medical care at your doctor's office, retail clinic, urgent care center, or emergency room.  If you have a medical emergency, please immediately call 911 or go to the emergency department.  Pager Numbers  - Dr. Nehemiah Massed: [phone]  - Dr. Laurence Ferrari: [phone]  - Dr. Nicole Kindred: [phone]  In the event of inclement weather, please call our main line at [phone] for an update on the status of any delays or closures.  Dermatology Medication Tips: Please keep the boxes that topical medications come in in order to help keep track of the instructions about where and how to use these. Pharmacies typically print the medication instructions only on the boxes and not directly on the medication tubes.   If your medication is too expensive,  please contact our office at [phone] option 4 or send [place] a message through [place].   We are unable to tell what your co-pay for medications will be in advance as this is different depending on your insurance coverage. However, we may be able to find a substitute medication at lower cost or fill out paperwork to get insurance to cover a needed medication.   If a prior authorization is required to get your medication covered by your insurance company, please allow [place] 1-2 business days to complete this process.  Drug prices often vary depending on where the prescription is filled and some pharmacies may offer cheaper prices.  The website www.goodrx.com contains coupons for medications through different pharmacies. The prices here do not account for what the cost may be with help from insurance (it may be cheaper with your insurance), but the website can give you the price if you did not use any insurance.  - You can print the associated coupon and take it with your prescription to the pharmacy.  - You may also stop by our office during regular business hours and pick up a GoodRx coupon card.  - If you need your prescription sent electronically to a different pharmacy, notify our office through [facility] or by phone at [phone] option 4.

## 2021-04-29 NOTE — Progress Notes (Signed)
Follow-Up Visit   Subjective  [name] Ferguson is a 80 y.o. male who presents for the following: Annual Exam (6 months f/u mole check, hx of skin cancers, hx of Ak ) and Skin Problem (Check arms thin and bruising easily ). The patient presents for Upper Body Skin Exam (UBSE) for skin cancer screening and mole check.   The following portions of the chart were reviewed this encounter and updated as appropriate:   Tobacco  Allergies  Meds  Problems  Med Hx  Surg Hx  Fam Hx     Review of Systems:  No other skin or systemic complaints except as noted in HPI or Assessment and Plan.  Objective  Well appearing patient in no apparent distress; mood and affect are within normal limits.  A full examination was performed including scalp, head, eyes, ears, nose, lips, neck, chest, axillae, abdomen, back, buttocks, bilateral upper extremities, bilateral lower extremities, hands, feet, fingers, toes, fingernails, and toenails. All findings within normal limits unless otherwise noted below.  scalp x 3 (3) Erythematous thin papules/macules with gritty scale.   arms x 5 (5) Erythematous keratotic or waxy stuck-on papule or plaque.   Head - Anterior (Face) Mid face erythema with telangiectasias +/- scattered     Assessment & Plan  AK (actinic keratosis) (3) scalp x 3  Destruction of lesion - scalp x 3 Complexity: simple   Destruction method: cryotherapy   Informed consent: discussed and consent obtained   Timeout:  patient name, date of birth, surgical site, and procedure verified Lesion destroyed using liquid nitrogen: Yes   Region frozen until ice ball extended beyond lesion: Yes   Outcome: patient tolerated procedure well with no complications   Post-procedure details: wound care instructions given    Inflamed seborrheic keratosis arms x 5  Destruction of lesion - arms x 5 Complexity: simple   Destruction method: cryotherapy   Informed consent: discussed and consent obtained    Timeout:  patient name, date of birth, surgical site, and procedure verified Lesion destroyed using liquid nitrogen: Yes   Region frozen until ice ball extended beyond lesion: Yes   Outcome: patient tolerated procedure well with no complications   Post-procedure details: wound care instructions given    Rosacea Head - Anterior (Face)  Rosacea is a chronic progressive skin condition usually affecting the face of adults, causing redness and/or acne bumps. It is treatable but not curable. It sometimes affects the eyes (ocular rosacea) as well. It may respond to topical and/or systemic medication and can flare with stress, sun exposure, alcohol, exercise and some foods.  Daily application of broad spectrum spf 30+ sunscreen to face is recommended to reduce flares.   Recommend cosmetic laser treatment   Skin cancer screening  Lentigines - Scattered tan macules - Due to sun exposure - Benign-appearing, observe - Recommend daily broad spectrum sunscreen SPF 30+ to sun-exposed areas, reapply every 2 hours as needed. - Call for any changes  Seborrheic Keratoses - Stuck-on, waxy, tan-brown papules and/or plaques  - Benign-appearing - Discussed benign etiology and prognosis. - Observe - Call for any changes  Melanocytic Nevi - Tan-brown and/or pink-flesh-colored symmetric macules and papules - Benign appearing on exam today - Observation - Call clinic for new or changing moles - Recommend daily use of broad spectrum spf 30+ sunscreen to sun-exposed areas.   Hemangiomas - Red papules - Discussed benign nature - Observe - Call for any changes  Actinic Damage - Chronic condition, secondary to cumulative  UV/sun exposure - diffuse scaly erythematous macules with underlying dyspigmentation - Recommend daily broad spectrum sunscreen SPF 30+ to sun-exposed areas, reapply every 2 hours as needed.  - Staying in the shade or wearing long sleeves, sun glasses (UVA+UVB protection) and wide  brim hats (4-inch brim around the entire circumference of the hat) are also recommended for sun protection.  - Call for new or changing lesions.  Purpura - Chronic; persistent and recurrent.  Treatable, but not curable. Arms  - Violaceous macules and patches - Benign - Related to trauma, age, sun damage and/or use of blood thinners, chronic use of topical and/or oral steroids - Observe - Can use OTC arnica containing moisturizer such as Dermend Bruise Formula if desired - Call for worsening or other concerns   History of Squamous Cell Carcinoma of the Skin Multiple see history - No evidence of recurrence today - No lymphadenopathy - Recommend regular full body skin exams - Recommend daily broad spectrum sunscreen SPF 30+ to sun-exposed areas, reapply every 2 hours as needed.  - Call if any new or changing lesions are noted between office visits  Skin cancer screening performed today.   Return in about 6 months (around 10/28/2021) for TBSE, hx of AKs, hx of SCC.  [name], CMA, am acting as scribe for [name], MD .  Documentation: I have reviewed the above documentation for accuracy and completeness, and I agree with the above.  [name], MD

## 2021-05-20 ENCOUNTER — Ambulatory Visit: Payer: Medicare Other | Admitting: Podiatry

## 2021-06-16 ENCOUNTER — Other Ambulatory Visit: Payer: Self-pay | Admitting: Urology

## 2021-06-16 DIAGNOSIS — N401 Enlarged prostate with lower urinary tract symptoms: Secondary | ICD-10-CM

## 2021-06-17 ENCOUNTER — Ambulatory Visit (INDEPENDENT_AMBULATORY_CARE_PROVIDER_SITE_OTHER): Payer: Medicare Other | Admitting: Podiatry

## 2021-06-17 ENCOUNTER — Other Ambulatory Visit: Payer: Self-pay

## 2021-06-17 ENCOUNTER — Encounter: Payer: Self-pay | Admitting: Podiatry

## 2021-06-17 DIAGNOSIS — E1142 Type 2 diabetes mellitus with diabetic polyneuropathy: Secondary | ICD-10-CM

## 2021-06-17 DIAGNOSIS — M2041 Other hammer toe(s) (acquired), right foot: Secondary | ICD-10-CM

## 2021-06-17 DIAGNOSIS — M79674 Pain in right toe(s): Secondary | ICD-10-CM

## 2021-06-17 DIAGNOSIS — B351 Tinea unguium: Secondary | ICD-10-CM | POA: Diagnosis not present

## 2021-06-17 DIAGNOSIS — M79675 Pain in left toe(s): Secondary | ICD-10-CM | POA: Diagnosis not present

## 2021-06-17 DIAGNOSIS — M2042 Other hammer toe(s) (acquired), left foot: Secondary | ICD-10-CM

## 2021-06-17 DIAGNOSIS — D689 Coagulation defect, unspecified: Secondary | ICD-10-CM

## 2021-06-17 NOTE — Progress Notes (Signed)
This patient returns to my office for at risk foot care.  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  Patient is taking eliquiss.  This patient is unable to cut nails himself since the patient cannot reach his nails.These nails are painful walking and wearing shoes.  This patient presents for at risk foot care today.  General Appearance  Alert, conversant and in no acute stress.  Vascular  Dorsalis pedis and posterior tibial  pulses are palpable  bilaterally.  Capillary return is within normal limits  bilaterally. Temperature is within normal limits  bilaterally.  Neurologic  Senn-Weinstein monofilament wire test diminished  bilaterally. Muscle power within normal limits bilaterally.  Nails Thick disfigured discolored nails with subungual debris  from hallux to fifth toes bilaterally. No evidence of bacterial infection or drainage bilaterally.  Orthopedic  No limitations of motion  feet .  No crepitus or effusions noted.  No bony pathology or digital deformities noted.  HAV  B/L.  Hammer toes  B/L.  Skin  normotropic skin with no porokeratosis noted bilaterally.  No signs of infections or ulcers noted.     Onychomycosis  Pain in right toes  Pain in left toes  Consent was obtained for treatment procedures.   Mechanical debridement of nails 1-5  bilaterally performed with a nail nipper.  Filed with dremel without incident.    Return office visit    10 weeks                 Told patient to return for periodic foot care and evaluation due to potential at risk complications.   Gardiner Barefoot DPM

## 2021-06-25 NOTE — Progress Notes (Signed)
[facility] [place], [place] [address]  Pulmonary Sleep Medicine   Office Visit Note  Patient Name: [name] Ferguson DOB: Aug 07, 1940 MRN [id number]    Chief Complaint: Obstructive Sleep Apnea visit  Brief History:  Lido is seen today for annual follow up The patient has a 14 year history of sleep apnea. Patient is using PAP nightly.  The patient alright the same after sleeping with PAP.  Reported sleepiness is  improved and the Epworth Sleepiness Score is 11 out of 24. The patient does not take naps. The patient complains of the following: wakes tired sometimes and has less energy, but no issues with sleepiness. The compliance download shows  compliance with an average use time of 7:13 hours@ 96%. The AHI is 0.8  The patient does not complain of limb movements disrupting sleep.  ROS  General: (-) fever, (-) chills, (-) night sweat Nose and Sinuses: (-) nasal stuffiness or itchiness, (-) postnasal drip, (-) nosebleeds, (-) sinus trouble. Mouth and Throat: (-) sore throat, (-) hoarseness. Neck: (-) swollen glands, (-) enlarged thyroid, (-) neck pain. Respiratory: - cough, + shortness of breath, - wheezing. Neurologic: - numbness, - tingling. Psychiatric: - anxiety, - depression   Current Medication: Outpatient Encounter Medications as of 06/28/2021  Medication Sig   amiodarone (PACERONE) 200 MG tablet Take 1.5 tablets by mouth daily.   apixaban (ELIQUIS) 5 MG TABS tablet Take 1 tablet by mouth 2 (two) times daily.   ascorbic acid (VITAMIN C) 1000 MG tablet Take 1 tablet by mouth daily.   canagliflozin (INVOKANA) 100 MG TABS tablet Take by mouth.   clopidogrel (PLAVIX) 75 MG tablet Take 1 tablet by mouth daily.   metoprolol succinate (TOPROL-XL) 25 MG 24 hr tablet Take 1 tablet by mouth daily.   nitroGLYCERIN (NITROSTAT) 0.4 MG SL tablet Place under the tongue.   pravastatin (PRAVACHOL) 40 MG tablet Take 1 tablet by mouth every evening.   tamsulosin  (FLOMAX) 0.4 MG CAPS capsule Take 1 capsule by mouth daily as needed.   acyclovir (ZOVIRAX) 400 MG tablet TK 1 T PO TID FOR 5 DAYS FOR EACH FEVER BLISTER OUTBREAK   amiodarone (PACERONE) 200 MG tablet Take by mouth.   amoxicillin (AMOXIL) 500 MG capsule Before dental procedures   Aspirin-Calcium Carbonate 81-777 MG TABS Take by mouth.   Besifloxacin HCl 0.6 % SUSP Apply to eye.   Blood Glucose Monitoring Suppl (ONETOUCH VERIO IQ SYSTEM) w/Device KIT U UTD TO CHECK BS QID   brimonidine (ALPHAGAN) 0.15 % ophthalmic solution Place 1 drop into the left eye 2 (two) times daily.   Brinzolamide-Brimonidine 1-0.2 % SUSP Apply 1 drop to eye 2 (two) times daily.   cefdinir (OMNICEF) 300 MG capsule Take 300 mg by mouth 2 (two) times daily.   clopidogrel (PLAVIX) 75 MG tablet 1 tab by mouth daily   dorzolamide (TRUSOPT) 2 % ophthalmic solution Administer 2 drops to both eyes Two (2) times a day.   ELIQUIS 5 MG TABS tablet    ezetimibe (ZETIA) 10 MG tablet Take 1 tablet by mouth daily.   FLUAD 0.5 ML SUSY TO BE ADMINISTERED BY PHARMACIST FOR IMMUNIZATION   fluticasone (FLONASE) 50 MCG/ACT nasal spray U 1 SPR NASALLY BID   glucagon 1 MG injection Use as directed for emergent low blood sugar,   Glucosamine HCl 1500 MG TABS    glucosamine-chondroitin 500-400 MG tablet Take 1 tablet by mouth daily.   glucose blood test strip TEST 3 TIMES DAILY  HYDROcodone-acetaminophen (NORCO/VICODIN) 5-325 MG tablet Take 1 tablet by mouth every 8 (eight) hours as needed.   insulin detemir (LEVEMIR) 100 UNIT/ML FlexPen Inject 35-40 Units into the skin at bedtime. Says 50 units is prescribed but will usually take 35-40 at night   insulin glargine (LANTUS SOLOSTAR) 100 UNIT/ML Solostar Pen Inject into the skin.   insulin glargine (LANTUS) 100 UNIT/ML Solostar Pen Inject into the skin.   Insulin Pen Needle (PEN NEEDLES 31GX5/16") 31G X 8 MM MISC as directed   INVOKANA 100 MG TABS tablet    isosorbide mononitrate (IMDUR) 30  MG 24 hr tablet Take 30 mg by mouth daily.    KLOR-CON M20 20 MEQ tablet Take 20 mEq by mouth 2 (two) times daily.   latanoprost (XALATAN) 0.005 % ophthalmic solution    levothyroxine (SYNTHROID) 100 MCG tablet Take 100 mcg by mouth daily.   levothyroxine (SYNTHROID) 50 MCG tablet PLEASE SEE ATTACHED FOR DETAILED DIRECTIONS   lisinopril (ZESTRIL) 10 MG tablet Take 10 mg by mouth every morning.   metFORMIN (GLUCOPHAGE) 1000 MG tablet 1,000 mg 2 (two) times daily with a meal.    metFORMIN (GLUCOPHAGE-XR) 500 MG 24 hr tablet Take 1,000 mg by mouth 2 (two) times daily.   metoprolol succinate (TOPROL-XL) 25 MG 24 hr tablet TAKE 1/2 TABLET BY MOUTH EVERY DAY   Multiple Vitamin (MULTI-VITAMIN) tablet Take by mouth.   Multiple Vitamins-Minerals (MULTIVITAMIN ADULT PO) Take by mouth.   mupirocin ointment (BACTROBAN) 2 % Apply 1 application topically daily. Qd to excision site   nitroGLYCERIN (NITROSTAT) 0.4 MG SL tablet PLACE 1 TABLET UNDER THE TONGUE EVERY 5 MINUTES AS NEEDED   Omega-3 Fatty Acids (FISH OIL) 1000 MG CAPS Take 4 capsules by mouth 2 (two) times daily. 4 capsules in morning and one at night   Omega-3 Fatty Acids (OMEGA-3 FISH OIL PO) Take by mouth.   ONETOUCH DELICA LANCETS 91B MISC USE 1 LANCET 3 TIMES A DAY   potassium chloride (KLOR-CON) 10 MEQ tablet Take 10 mEq by mouth daily.   pravastatin (PRAVACHOL) 40 MG tablet TAKE 1 TABLET BY MOUTH EVERY EVENING   prednisoLONE acetate (PRED FORTE) 1 % ophthalmic suspension PLACE 1 DROP INTO THE RIGHT EYE 6 (SIX) TIMES DAILY   REPATHA SURECLICK 166 MG/ML SOAJ Inject 1 mL into the skin every 14 (fourteen) days.   tamsulosin (FLOMAX) 0.4 MG CAPS capsule TAKE 1 CAPSULE(0.4 MG) BY MOUTH DAILY   torsemide (DEMADEX) 20 MG tablet Take by mouth.   TRADJENTA 5 MG TABS tablet    triamcinolone cream (KENALOG) 0.1 % Apply topically.   UNABLE TO FIND Take by mouth.   VASCEPA 0.5 g CAPS Take 4 capsules by mouth 2 (two) times daily.   vitamin B-12  (CYANOCOBALAMIN) 500 MCG tablet Take 500 mcg by mouth daily.   vitamin C (ASCORBIC ACID) 500 MG tablet Take 500 mg by mouth daily.   No facility-administered encounter medications on file as of 06/28/2021.    Surgical History: Past Surgical History:  Procedure Laterality Date   BACK SURGERY     CARDIAC CATHETERIZATION     COLONOSCOPY     COLONOSCOPY WITH PROPOFOL N/A 09/22/2015   Procedure: COLONOSCOPY WITH PROPOFOL;  Surgeon: Lucilla Lame, MD;  Location: [facility] ENDOSCOPY;  Service: Endoscopy;  Laterality: N/A;   CORONARY ARTERY BYPASS GRAFT  1996   HEMORRHOID SURGERY  2007   HEMORRHOID SURGERY  08-12-15   excision internal hemorrhoid Dr Jamal Collin   JOINT REPLACEMENT Left 2004  Knee    Medical History: Past Medical History:  Diagnosis Date   Actinic keratosis    Angina pectoris ([place])    Atherosclerosis with limb claudication (HCC)    BPH (benign prostatic hypertrophy) with urinary obstruction    CHF (congestive heart failure) (HCC)    Diabetes mellitus without complication (HCC)    HLD (hyperlipidemia)    Hypertension    Obesity    Penile adhesions    Sleep apnea    SOB (shortness of breath)    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   Stroke [facility])     Family History: Non contributory to the present illness  Social History: Social History   Socioeconomic History   Marital status: Married    Spouse name: Not on file   Number of children: Not on file   Years of education: Not on file   Highest education level: Not on file  Occupational History   Not on file  Tobacco Use   Smoking status: Former    Types: Cigarettes    Quit date: 07/11/1981    Years since quitting: 39.9   Smokeless tobacco: Never  Substance and Sexual Activity   Alcohol use: Yes    Alcohol/week: 0.0 standard drinks    Comment: occasionally   Drug use: No    Sexual activity: Not on file  Other Topics Concern   Not on file  Social History Narrative   Not on file   Social Determinants of Health   Financial Resource Strain: Not on file  Food Insecurity: Not on file  Transportation Needs: Not on file  Physical Activity: Not on file  Stress: Not on file  Social Connections: Not on file  Intimate Partner Violence: Not on file    Vital Signs: Blood pressure 118/63, pulse 63, resp. rate 20, height 5' 7" (1.702 m), weight 233 lb (105.7 kg), SpO2 95 %. Body mass index is 36.49 kg/m.    Examination: General Appearance: The patient is well-developed, well-nourished, and in no distress. Neck Circumference: 44cm Skin: Gross inspection of skin unremarkable. Head: normocephalic, no gross deformities. Eyes: no gross deformities noted. ENT: ears appear grossly normal Neurologic: Alert and oriented. No involuntary movements.    EPWORTH SLEEPINESS SCALE:  Scale:  (0)= no chance of dozing; (1)= slight chance of dozing; (2)= moderate chance of dozing; (3)= high chance of dozing  Chance  Situtation    Sitting and reading: 2    Watching TV: 2    Sitting Inactive in public: 1    As a passenger in car: 1      Lying down to rest: 3    Sitting and talking: 0    Sitting quielty after lunch: 2    In a car, stopped in traffic: 0   TOTAL SCORE:   11 out of 24    SLEEP STUDIES:  Split 02/06/2007 - AHi 42, SpO2 min 79.5%   CPAP COMPLIANCE DATA:  Date Range: 06/25/20 - 06/24/21  Average Daily Use: 7:13 hours  Median Use: 7:25 hours  Compliance for > 4 Hours: 96%  AHI: 0.8 respiratory events per hour  Days Used: 352/365  Mask Leak: 35.7 lpm  95th Percentile Pressure: Vpap Auto 25/10,  PS 6         LABS: No results found for this or any previous visit (from the past 2160  hour(s)).  Radiology: DG Hand Complete Right  Result Date: 05/16/2020 CLINICAL DATA:  Status post fall. Laceration to fifth digit and swelling  throughout the metacarpal bones. EXAM: RIGHT HAND - COMPLETE 3+ VIEW COMPARISON:  None FINDINGS: Marked dorsal soft tissue swelling is identified centered over the metacarpal phalangeal joints. There is a deformity involving the head of the third metacarpal bone. On the lateral radiograph there is an ossified density along the dorsal aspect of the head of the metatarsal bones which may reflect a displaced fracture fragment. Mild degenerative changes are noted within the DIP joints. IMPRESSION: 1. Marked dorsal soft tissue swelling. Deformity of the head of the third metatarsal bone is noted with suspicious ossific density along the dorsal aspect of the metacarpal heads, which is concerning for a displaced fracture fragment. 2. Osteoarthritis of the DIP joints. Electronically Signed   By: Kerby Moors M.D.   On: 05/16/2020 11:32    No results found.  No results found.    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[place]) 03/28/2019   Cardiac arrest with ventricular fibrillation ([place]) 03/18/2019   Moderate right ventricular systolic dysfunction 32/35/5732   Pain due to onychomycosis of toenails of both feet 01/17/2019   Diabetic neuropathy ([place]) 01/17/2019   On continuous oral anticoagulation 12/24/2018   Paroxysmal atrial fibrillation ([place]) 12/24/2018   Primary open angle glaucoma of both eyes, severe stage 12/04/2018   Pseudophakia of both eyes 12/04/2018   Vision changes 12/04/2018   Stroke ([place]) 06/26/2017   Intramuscular hematoma 04/13/2017   Acute on chronic diastolic heart failure ([place]) 10/25/2016   OSA on CPAP 10/25/2016   Chest pain, atypical 10/24/2016    Shortness of breath 10/24/2016   Bacteremia due to Gram-negative bacteria 06/07/2016   Hypokalemia 06/07/2016   Hypomagnesemia 06/07/2016   Diarrhea, unspecified 06/06/2016   Thrombocytopenia ([place]) 06/06/2016   Sepsis due to urinary tract infection ([place]) 06/05/2016   BPH with obstruction/lower urinary tract symptoms 09/22/2015   Special screening for malignant neoplasms, colon    Rectal polyp    Adiposity 03/18/2014   Heart failure ([place]) 11/07/2013   Type 2 diabetes mellitus treated with insulin ([place]) 04/08/2011   HLD (hyperlipidemia) 04/08/2011   Arthritis of knee, degenerative 04/08/2011   Diabetes ([place]) 05/15/2007   HYPERLIPIDEMIA 05/15/2007   Essential hypertension 05/15/2007   Coronary atherosclerosis 05/15/2007   DYSPNEA ON EXERTION 05/15/2007   TOBACCO ABUSE, HX OF 05/15/2007   CORONARY ARTERY BYPASS GRAFT, HX OF 05/15/2007   1. OSA treated with BiPAP The patient does tolerate PAP and reports  benefit from PAP use. The patient was reminded how to clean equipment and advised to replace supplies routinely. The patient was also counselled on weight loss. The compliance is excellent. The AHI is 0.8.   OSA treated with bipap, continue with excellent compliance with pap. F/u one year.   2. Encounter for BiPAP use counseling CPAP Counseling: had a lengthy discussion with the patient regarding the importance of PAP therapy in management of the sleep apnea. Patient appears to understand the risk factor reduction and also understands the risks associated with untreated sleep apnea. Patient will try to make a good faith effort to remain  compliant with therapy. Also instructed the patient on proper cleaning of the device including the water must be changed daily if possible and use of distilled water is preferred. Patient understands that the machine should be regularly cleaned with appropriate recommended cleaning solutions that do not damage the PAP machine for example given white vinegar  and water rinses. Other methods such as ozone treatment may not be as good as these simple methods to achieve cleaning.   3. Acute on chronic diastolic heart failure (HCC) Chronic, follows with cardiology. He is stable.      General Counseling: I have discussed the findings of the evaluation and examination with Iona Beard.  I have also discussed any further diagnostic evaluation thatmay be needed or ordered today. Avi verbalizes understanding of the findings of todays visit. We also reviewed his medications today and discussed drug interactions and side effects including but not limited excessive drowsiness and altered mental states. We also discussed that there is always a risk not just to him but also people around him. he has been encouraged to call the office with any questions or concerns that should arise related to todays visit.  No orders of the defined types were placed in this encounter.       I have personally obtained a history, examined the patient, evaluated laboratory and imaging results, formulated the assessment and plan and placed orders. This patient was seen today by Tressie Ellis, PA-C in collaboration with Dr. Devona Konig.   Allyne Gee, MD [facility] Diplomate ABMS Pulmonary Critical Care Medicine and Sleep Medicine

## 2021-06-28 ENCOUNTER — Ambulatory Visit (INDEPENDENT_AMBULATORY_CARE_PROVIDER_SITE_OTHER): Payer: Medicare Other | Admitting: Internal Medicine

## 2021-06-28 VITALS — BP 118/63 | HR 63 | Resp 20 | Ht 67.0 in | Wt 233.0 lb

## 2021-06-28 DIAGNOSIS — I5033 Acute on chronic diastolic (congestive) heart failure: Secondary | ICD-10-CM

## 2021-06-28 DIAGNOSIS — Z7189 Other specified counseling: Secondary | ICD-10-CM

## 2021-06-28 DIAGNOSIS — G4733 Obstructive sleep apnea (adult) (pediatric): Secondary | ICD-10-CM

## 2021-06-28 NOTE — Patient Instructions (Signed)

## 2021-07-01 IMAGING — CR DG HAND COMPLETE 3+V*R*
3 series · 3 of 3 positions shown · non-contrast
Comparison: None

CLINICAL DATA: Status post fall. Laceration to fifth digit and
swelling throughout the metacarpal bones.

EXAM:
RIGHT HAND - COMPLETE 3+ VIEW

[hand ap]
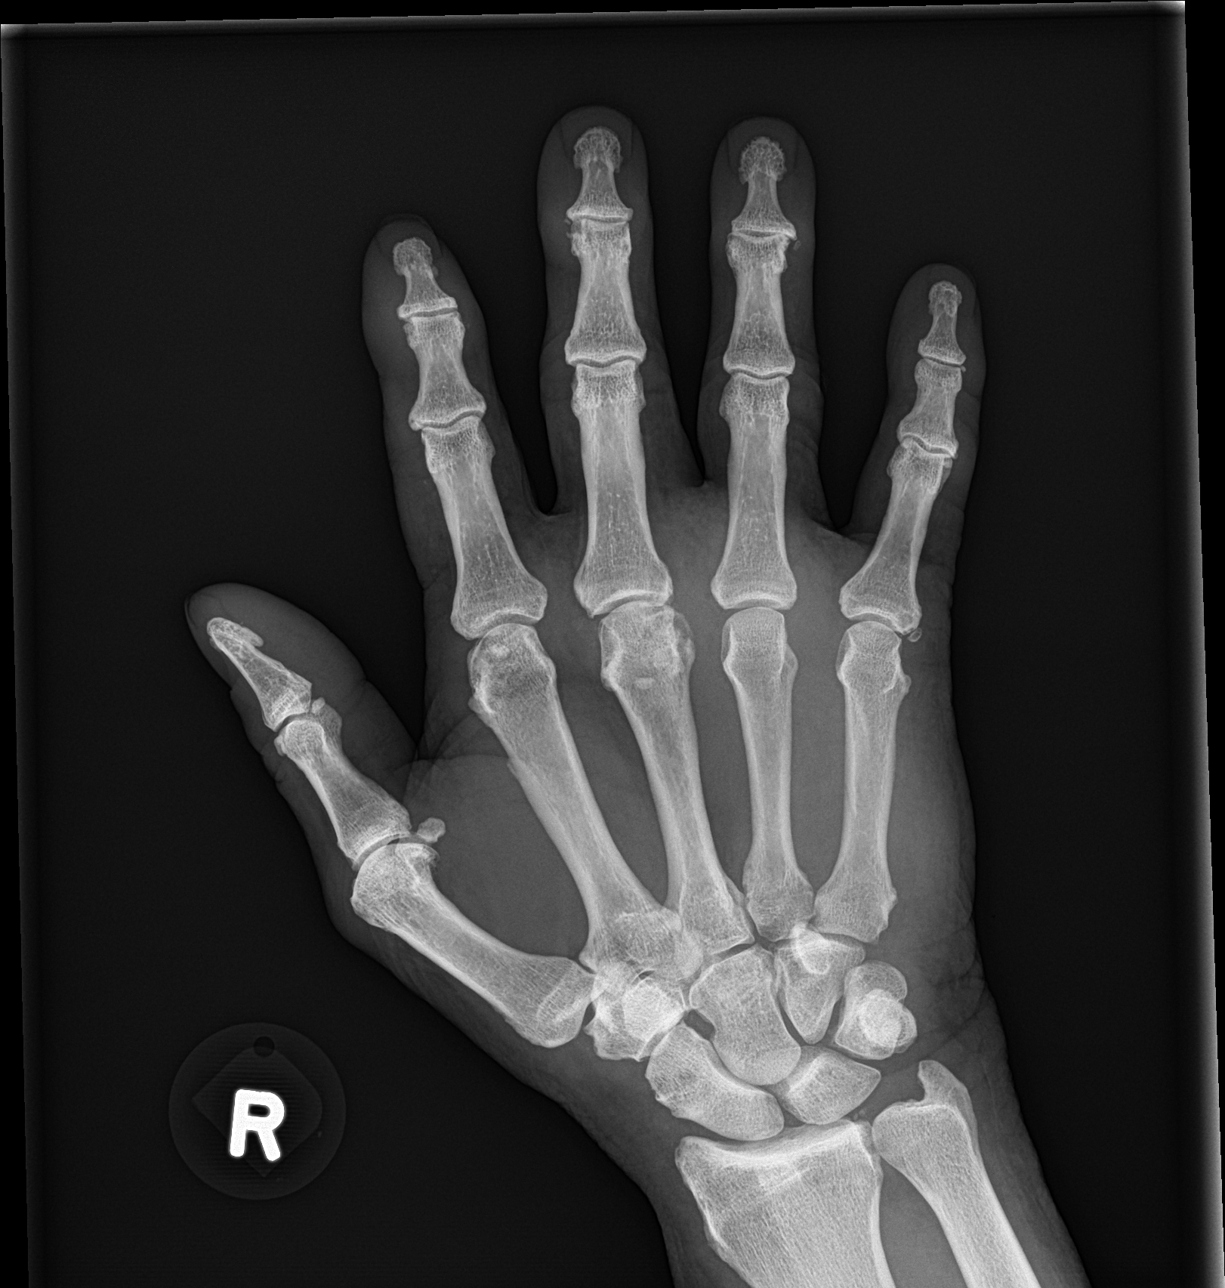

[hand obl]
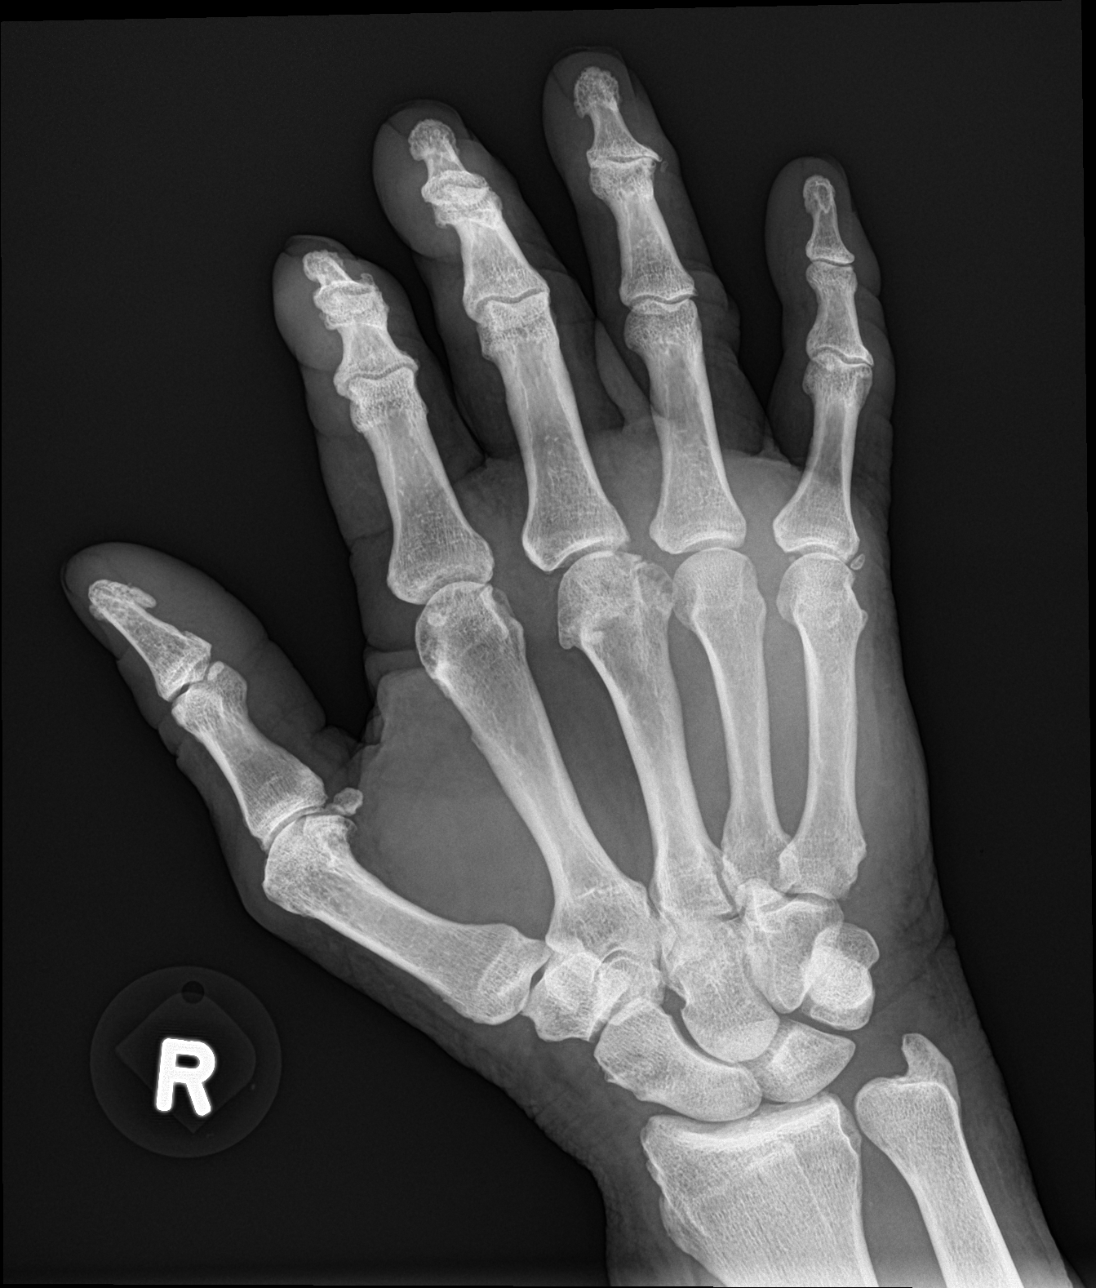

[hand lat]
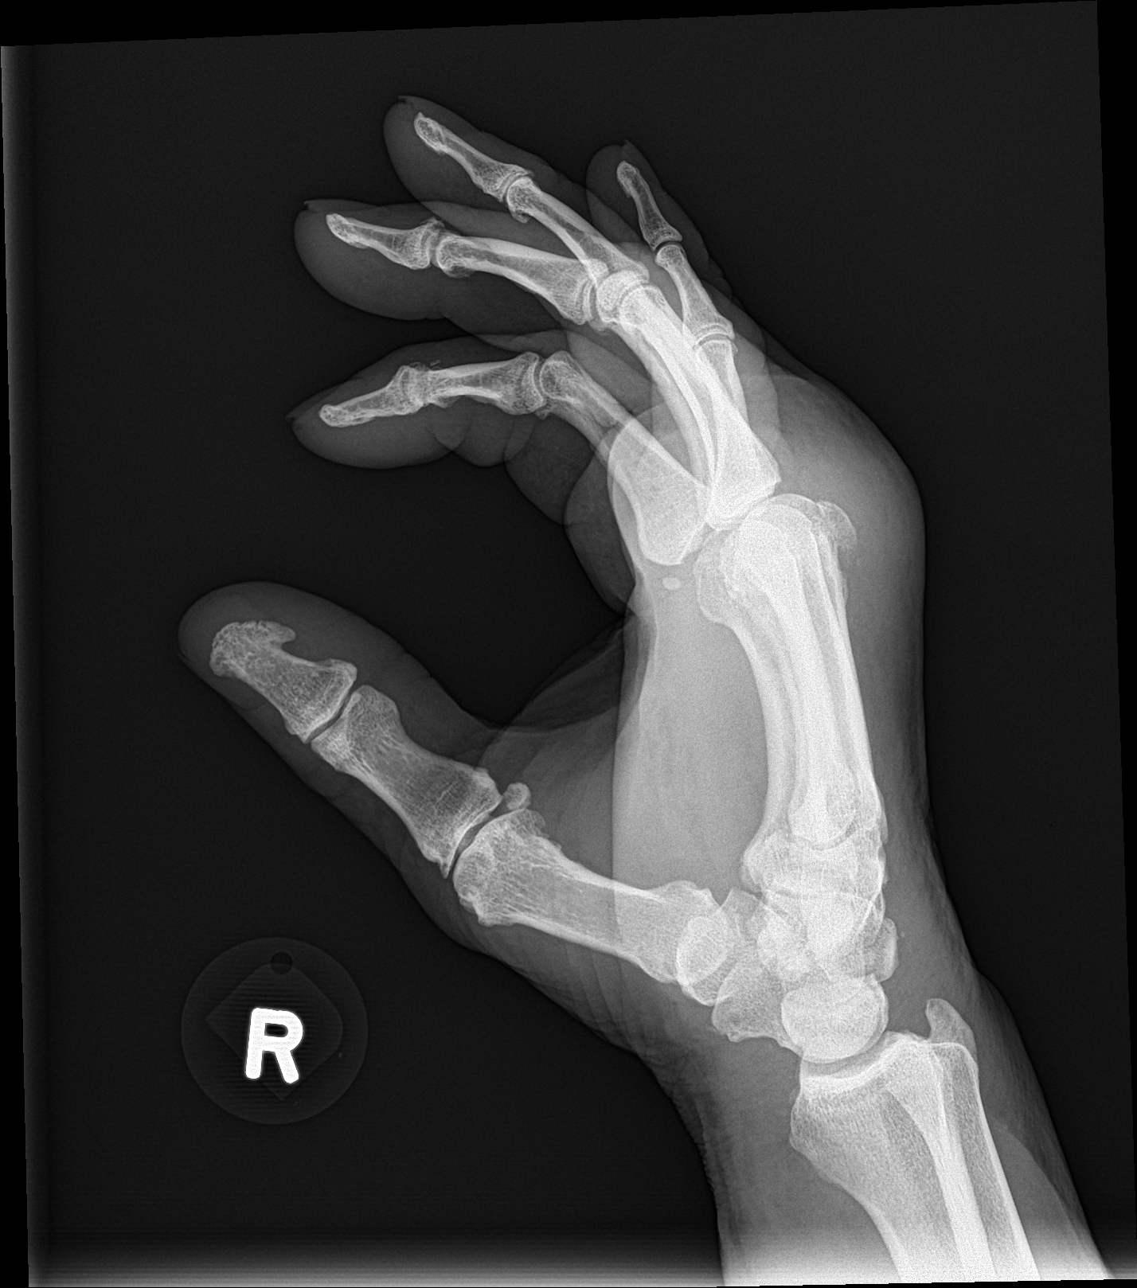

[3 of 3 positions shown; findings below may reference images not displayed]

FINDINGS: Marked dorsal soft tissue swelling is identified centered over the
metacarpal phalangeal joints. There is a deformity involving the
head of the third metacarpal bone. On the lateral radiograph there
is an ossified density along the dorsal aspect of the head of the
metatarsal bones which may reflect a displaced fracture fragment.
Mild degenerative changes are noted within the DIP joints.
IMPRESSION: 1. Marked dorsal soft tissue swelling. Deformity of the head of the
third metatarsal bone is noted with suspicious ossific density along
the dorsal aspect of the metacarpal heads, which is concerning for a
displaced fracture fragment.
2. Osteoarthritis of the DIP joints.

## 2021-08-11 ENCOUNTER — Other Ambulatory Visit: Payer: Self-pay

## 2021-08-11 ENCOUNTER — Encounter: Payer: Self-pay | Admitting: Podiatrist

## 2021-08-11 ENCOUNTER — Ambulatory Visit (INDEPENDENT_AMBULATORY_CARE_PROVIDER_SITE_OTHER): Payer: Medicare Other | Admitting: Podiatrist

## 2021-08-11 ENCOUNTER — Telehealth: Payer: Self-pay | Admitting: *Deleted

## 2021-08-11 DIAGNOSIS — S99922A Unspecified injury of left foot, initial encounter: Secondary | ICD-10-CM | POA: Diagnosis not present

## 2021-08-11 DIAGNOSIS — S90122A Contusion of left lesser toe(s) without damage to nail, initial encounter: Secondary | ICD-10-CM | POA: Diagnosis not present

## 2021-08-11 MED ORDER — DOXYCYCLINE HYCLATE 100 MG PO TABS: 100.0000 mg | ORAL_TABLET | Freq: Two times a day (BID) | ORAL | 0 refills | Status: DC

## 2021-08-11 NOTE — Patient Instructions (Signed)

## 2021-08-11 NOTE — Telephone Encounter (Signed)
Patient is having a lot of bleeding from an ingrown toe, saturated thru first dressing change, has soaked but still bleeding. The patient went to [facility]  and they called for advise. He is on a blood thinner and does not know what to do at this point. Please advise.  Called and spoke with patient's wife,instructed  to follow the soaking instructions given, keep elevated if it keeps bleeding to go to Urgent  Care or ER, do not let him go to much longer if it keeps soaking thru bandages.she verbalized understanding and said ok.

## 2021-08-11 NOTE — Progress Notes (Signed)
Chief Complaint  Patient presents with   Nail Problem    Patient presents today for concern of infection in left great toe x 2-3 days.  He denies any injury, but says its tender to touch, red/blue around bottom of nail bed and nail has become discolored     HPI: Patient is 81 y.o. male who presents today for swelling and bruising on the left great toe for the past 2 to 3 days.  Patient relates no injury that he can recall but states it is tender to touch and its red and bruised around the bottom of the nailbed.  He has noticed no pus or drainage from the toe.  He is diabetic and is on plavix.  Current Outpatient Medications on File Prior to Visit  Medication Sig Dispense Refill   acyclovir (ZOVIRAX) 400 MG tablet TK 1 T PO TID FOR 5 DAYS FOR EACH FEVER BLISTER OUTBREAK     amiodarone (PACERONE) 200 MG tablet Take by mouth.     amiodarone (PACERONE) 200 MG tablet Take 1.5 tablets by mouth daily.     amoxicillin (AMOXIL) 500 MG capsule Before dental procedures     apixaban (ELIQUIS) 5 MG TABS tablet Take 1 tablet by mouth 2 (two) times daily.     ascorbic acid (VITAMIN C) 1000 MG tablet Take 1 tablet by mouth daily.     Aspirin-Calcium Carbonate 81-777 MG TABS Take by mouth.     Besifloxacin HCl 0.6 % SUSP Apply to eye.     Blood Glucose Monitoring Suppl (ONETOUCH VERIO IQ SYSTEM) w/Device KIT U UTD TO CHECK BS QID     brimonidine (ALPHAGAN) 0.15 % ophthalmic solution Place 1 drop into the left eye 2 (two) times daily.     Brinzolamide-Brimonidine 1-0.2 % SUSP Apply 1 drop to eye 2 (two) times daily.     canagliflozin (INVOKANA) 100 MG TABS tablet Take by mouth.     cefdinir (OMNICEF) 300 MG capsule Take 300 mg by mouth 2 (two) times daily.     clopidogrel (PLAVIX) 75 MG tablet 1 tab by mouth daily     clopidogrel (PLAVIX) 75 MG tablet Take 1 tablet by mouth daily.     dorzolamide (TRUSOPT) 2 % ophthalmic solution Administer 2 drops to both eyes Two (2) times a day.     ELIQUIS 5 MG TABS  tablet      ezetimibe (ZETIA) 10 MG tablet Take 1 tablet by mouth daily.     FLUAD 0.5 ML SUSY TO BE ADMINISTERED BY PHARMACIST FOR IMMUNIZATION  0   fluticasone (FLONASE) 50 MCG/ACT nasal spray U 1 SPR NASALLY BID     glucagon 1 MG injection Use as directed for emergent low blood sugar,     Glucosamine HCl 1500 MG TABS      glucosamine-chondroitin 500-400 MG tablet Take 1 tablet by mouth daily.     glucose blood test strip TEST 3 TIMES DAILY     HYDROcodone-acetaminophen (NORCO/VICODIN) 5-325 MG tablet Take 1 tablet by mouth every 8 (eight) hours as needed. 10 tablet 0   insulin detemir (LEVEMIR) 100 UNIT/ML FlexPen Inject 35-40 Units into the skin at bedtime. Says 50 units is prescribed but will usually take 35-40 at night     insulin glargine (LANTUS SOLOSTAR) 100 UNIT/ML Solostar Pen Inject into the skin.     insulin glargine (LANTUS) 100 UNIT/ML Solostar Pen Inject into the skin.     Insulin Pen Needle (PEN NEEDLES 31GX5/16") 31G X 8 MM  MISC as directed     INVOKANA 100 MG TABS tablet      isosorbide mononitrate (IMDUR) 30 MG 24 hr tablet Take 30 mg by mouth daily.      KLOR-CON M20 20 MEQ tablet Take 20 mEq by mouth 2 (two) times daily.  2   latanoprost (XALATAN) 0.005 % ophthalmic solution      levothyroxine (SYNTHROID) 100 MCG tablet Take 100 mcg by mouth daily.     levothyroxine (SYNTHROID) 50 MCG tablet PLEASE SEE ATTACHED FOR DETAILED DIRECTIONS     lisinopril (ZESTRIL) 10 MG tablet Take 10 mg by mouth every morning.     metFORMIN (GLUCOPHAGE) 1000 MG tablet 1,000 mg 2 (two) times daily with a meal.      metFORMIN (GLUCOPHAGE-XR) 500 MG 24 hr tablet Take 1,000 mg by mouth 2 (two) times daily.     metoprolol succinate (TOPROL-XL) 25 MG 24 hr tablet TAKE 1/2 TABLET BY MOUTH EVERY DAY     metoprolol succinate (TOPROL-XL) 25 MG 24 hr tablet Take 1 tablet by mouth daily.     Multiple Vitamin (MULTI-VITAMIN) tablet Take by mouth.     Multiple Vitamins-Minerals (MULTIVITAMIN ADULT PO)  Take by mouth.     mupirocin ointment (BACTROBAN) 2 % Apply 1 application topically daily. Qd to excision site 22 g 0   nitroGLYCERIN (NITROSTAT) 0.4 MG SL tablet PLACE 1 TABLET UNDER THE TONGUE EVERY 5 MINUTES AS NEEDED     nitroGLYCERIN (NITROSTAT) 0.4 MG SL tablet Place under the tongue.     Omega-3 Fatty Acids (FISH OIL) 1000 MG CAPS Take 4 capsules by mouth 2 (two) times daily. 4 capsules in morning and one at night     Omega-3 Fatty Acids (OMEGA-3 FISH OIL PO) Take by mouth.     ONETOUCH DELICA LANCETS 09O MISC USE 1 LANCET 3 TIMES A DAY     potassium chloride (KLOR-CON) 10 MEQ tablet Take 10 mEq by mouth daily.     pravastatin (PRAVACHOL) 40 MG tablet TAKE 1 TABLET BY MOUTH EVERY EVENING     pravastatin (PRAVACHOL) 40 MG tablet Take 1 tablet by mouth every evening.     prednisoLONE acetate (PRED FORTE) 1 % ophthalmic suspension PLACE 1 DROP INTO THE RIGHT EYE 6 (SIX) TIMES DAILY     REPATHA SURECLICK 709 MG/ML SOAJ Inject 1 mL into the skin every 14 (fourteen) days.     tamsulosin (FLOMAX) 0.4 MG CAPS capsule TAKE 1 CAPSULE(0.4 MG) BY MOUTH DAILY 90 capsule 3   tamsulosin (FLOMAX) 0.4 MG CAPS capsule Take 1 capsule by mouth daily as needed.     torsemide (DEMADEX) 20 MG tablet Take by mouth.     TRADJENTA 5 MG TABS tablet      triamcinolone cream (KENALOG) 0.1 % Apply topically.     UNABLE TO FIND Take by mouth.     VASCEPA 0.5 g CAPS Take 4 capsules by mouth 2 (two) times daily.     vitamin B-12 (CYANOCOBALAMIN) 500 MCG tablet Take 500 mcg by mouth daily.     vitamin C (ASCORBIC ACID) 500 MG tablet Take 500 mg by mouth daily.     No current facility-administered medications on file prior to visit.    Allergies  Allergen Reactions   Statins Other (See Comments)    Suspected cause of muscle pain    Review of systems is negative except as noted in the HPI.  Denies nausea/ vomiting/ fevers/ chills or night sweats.   Denies difficulty  breathing, denies calf pain or  tenderness  Physical Exam  Patient is awake, alert, and oriented x 3.  In no acute distress.    Vascular status is intact with palpable pedal pulses DP and PT bilateral and capillary refill time less than 3 seconds bilateral.  No edema or erythema noted.   Neurological exam reveals epicritic and protective sensation grossly decreased bilateral.   Dermatological exam reveals left hallux has a bruise beneath the toenail along the medial and central portion of the nail and proximally toward the nail bed.  There are some redness along the base of the toenail and tenderness with pressure is noted.  No pus or purulence is expressed no malodor is noted.  Remainder of digital toenails are thick, discolored, dystrophic and clinically mycotic.  Musculoskeletal exam: Musculature intact with dorsiflexion, plantarflexion, inversion, eversion. Ankle and First MPJ joint range of motion normal.    Assessment: Toenail contusion with injury to the nail plate Subungual hematoma  Plan: Discussed with the patient and his wife that I recommend a nonpermanent removal of the toenail/incision and drainage in order to remove the pressure from the underlying hematoma as well as to avoid future infection from the blood under the proximal nail fold.  The patient agreed and the toe was anesthetized with lidocaine Marcaine mixture in a digital block fashion.  The toe was then prepped with Betadine.  It was not exsanguinated.  Using a freer elevator the toenail was easily lifted off the underlying nail bed.  Of note it was partially unattached at the proximal portion of the nail.  Once the nail was removed the nailbed was cleansed well with Betadine solution and antibiotic ointment and a dry and sterile compressive dressing was applied.  The patient was given instructions for soaking and aftercare and was instructed to keep it bandaged for the next 5 to 10 days.  I am also starting him on him on some doxycycline 1 tablet  p.o. twice daily.  This was called into his pharmacy.  If any increased redness, swelling, pus or purulence arise he is instructed to call our office.  Otherwise this should resolve with this incision and drainage.

## 2021-08-12 NOTE — Telephone Encounter (Signed)
Returned call to patient, no answer,left vmessage for call back

## 2021-08-12 NOTE — Telephone Encounter (Signed)
"  I'm calling for [employer].  We were returning you call about his condition of his toenail that got taken off yesterday. If you could, give [place] a call back."

## 2021-08-26 ENCOUNTER — Other Ambulatory Visit: Payer: Self-pay

## 2021-08-26 ENCOUNTER — Encounter: Payer: Self-pay | Admitting: Podiatry

## 2021-08-26 ENCOUNTER — Ambulatory Visit (INDEPENDENT_AMBULATORY_CARE_PROVIDER_SITE_OTHER): Payer: Medicare Other | Admitting: Podiatry

## 2021-08-26 DIAGNOSIS — Z09 Encounter for follow-up examination after completed treatment for conditions other than malignant neoplasm: Secondary | ICD-10-CM

## 2021-08-26 NOTE — Progress Notes (Signed)
This patient presents to the office for examination of his left great toenail.  His nail was removed about 2 weeks ago.  He said the toe bled and would not stop so he went to the ER.  The bleeding stopped and he has caring for the toe at home.  Last night he became concerned since he saw blackness noted at base of toenail left foot.  He has no pain but is concerned about the black discoloration.  He has history of diabetes and coagulation defect. Patient takes both eliquis and plavix. He presents for evaluation and treatment.  Vascular  Dorsalis pedis and posterior tibial pulses are palpable  B/L.  Capillary return  WNL.  Temperature gradient is  WNL.  Skin turgor  WNL  Sensorium  Senn Weinstein monofilament wire diminished.  . Diminished tactile sensation.  Nail Exam  Patient has normal nails with no evidence of bacterial or fungal infection.Thick disfigured discolored nails  except left hallux nail. Hematoma noted at the proximal nail fold left hallux.  Healing nail bed left hallux.  No redness or swelling or drainage noted left hallux.  Orthopedic  Exam  Muscle tone and muscle strength  WNL.  No limitations of motion feet  B/L.  No crepitus or joint effusion noted.  Foot type is unremarkable and digits show no abnormalities.  HAV  B/L.  Skin  No open lesions.  Normal skin texture and turgor.   Postop ROV.  The nail bed is healing slowly due to diabetes.  The blackened area at proximal nail fold is hematoma due to plavix and eliquis.  Patient was told thisarea is healing slowly without an infection.  Told to use peroxide and air dry surgical site.  RTC prn  Gardiner Barefoot DPM

## 2021-09-02 ENCOUNTER — Ambulatory Visit: Payer: Medicare Other | Admitting: Podiatry

## 2021-09-09 ENCOUNTER — Ambulatory Visit: Payer: Medicare Other | Admitting: Podiatry

## 2021-09-09 ENCOUNTER — Ambulatory Visit (INDEPENDENT_AMBULATORY_CARE_PROVIDER_SITE_OTHER): Payer: Medicare Other | Admitting: Podiatry

## 2021-09-09 ENCOUNTER — Other Ambulatory Visit: Payer: Self-pay

## 2021-09-09 ENCOUNTER — Encounter: Payer: Self-pay | Admitting: Podiatry

## 2021-09-09 DIAGNOSIS — M79675 Pain in left toe(s): Secondary | ICD-10-CM

## 2021-09-09 DIAGNOSIS — M79674 Pain in right toe(s): Secondary | ICD-10-CM | POA: Diagnosis not present

## 2021-09-09 DIAGNOSIS — Z09 Encounter for follow-up examination after completed treatment for conditions other than malignant neoplasm: Secondary | ICD-10-CM

## 2021-09-09 DIAGNOSIS — E1142 Type 2 diabetes mellitus with diabetic polyneuropathy: Secondary | ICD-10-CM | POA: Diagnosis not present

## 2021-09-09 DIAGNOSIS — B351 Tinea unguium: Secondary | ICD-10-CM

## 2021-09-09 DIAGNOSIS — D689 Coagulation defect, unspecified: Secondary | ICD-10-CM

## 2021-09-09 NOTE — Progress Notes (Signed)
This patient returns to my office for at risk foot care.  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  Patient is taking eliquiss.  This patient is unable to cut nails himself since the patient cannot reach his nails.These nails are painful walking and wearing shoes.  This patient presents for at risk foot care today. ? ?General Appearance  Alert, conversant and in no acute stress. ? ?Vascular  Dorsalis pedis and posterior tibial  pulses are palpable  bilaterally.  Capillary return is within normal limits  bilaterally. Temperature is within normal limits  bilaterally. ? ?Neurologic  Senn-Weinstein monofilament wire test diminished  bilaterally. Muscle power within normal limits bilaterally. ? ?Nails Thick disfigured discolored nails with subungual debris  from hallux to fifth toes bilaterally. No evidence of bacterial infection or drainage bilaterally. ? ?Orthopedic  No limitations of motion  feet .  No crepitus or effusions noted.  No bony pathology or digital deformities noted.  HAV  B/L.  Hammer toes  B/L. Resolved nail surgery left hallux toenail.  No drainage or infection. ? ?Skin  normotropic skin with no porokeratosis noted bilaterally.  No signs of infections or ulcers noted.    ? ?Onychomycosis  Pain in right toes  Pain in left toes ? ?Consent was obtained for treatment procedures.   Mechanical debridement of nails 1-5  bilaterally performed with a nail nipper.  Filed with dremel without incident.  ? ? ?Return office visit    10 weeks                 Told patient to return for periodic foot care and evaluation due to potential at risk complications. ? ? ?Gardiner Barefoot DPM  ?

## 2021-09-16 ENCOUNTER — Ambulatory Visit: Payer: Medicare Other | Admitting: Podiatry

## 2021-09-30 ENCOUNTER — Ambulatory Visit: Payer: Medicare Other | Admitting: Podiatry

## 2021-11-02 ENCOUNTER — Ambulatory Visit: Payer: Medicare Other | Admitting: Dermatology

## 2021-11-17 ENCOUNTER — Ambulatory Visit (INDEPENDENT_AMBULATORY_CARE_PROVIDER_SITE_OTHER): Payer: Medicare Other | Admitting: Dermatology

## 2021-11-17 DIAGNOSIS — L719 Rosacea, unspecified: Secondary | ICD-10-CM | POA: Diagnosis not present

## 2021-11-17 DIAGNOSIS — Z86007 Personal history of in-situ neoplasm of skin: Secondary | ICD-10-CM | POA: Diagnosis not present

## 2021-11-17 DIAGNOSIS — D1800 Hemangioma unspecified site: Secondary | ICD-10-CM

## 2021-11-17 DIAGNOSIS — L821 Other seborrheic keratosis: Secondary | ICD-10-CM

## 2021-11-17 DIAGNOSIS — L820 Inflamed seborrheic keratosis: Secondary | ICD-10-CM

## 2021-11-17 DIAGNOSIS — Z1283 Encounter for screening for malignant neoplasm of skin: Secondary | ICD-10-CM

## 2021-11-17 DIAGNOSIS — D229 Melanocytic nevi, unspecified: Secondary | ICD-10-CM

## 2021-11-17 DIAGNOSIS — L814 Other melanin hyperpigmentation: Secondary | ICD-10-CM

## 2021-11-17 DIAGNOSIS — L570 Actinic keratosis: Secondary | ICD-10-CM | POA: Diagnosis not present

## 2021-11-17 MED ORDER — DOXYCYCLINE HYCLATE 20 MG PO TABS: 20.0000 mg | ORAL_TABLET | Freq: Every day | ORAL | 4 refills | Status: AC

## 2021-11-17 NOTE — Patient Instructions (Addendum)

## 2021-11-17 NOTE — Progress Notes (Signed)
Follow-Up Visit   Subjective  [name] Ferguson is a 81 y.o. male who presents for the following: Total body skin exam (Hx of SCCs, hx of AKs). The patient presents for Total-Body Skin Exam (TBSE) for skin cancer screening and mole check.  The patient has spots, moles and lesions to be evaluated, some may be new or changing and the patient has concerns that these could be cancer.  The following portions of the chart were reviewed this encounter and updated as appropriate:   Tobacco  Allergies  Meds  Problems  Med Hx  Surg Hx  Fam Hx     Review of Systems:  No other skin or systemic complaints except as noted in HPI or Assessment and Plan.  Objective  Well appearing patient in no apparent distress; mood and affect are within normal limits.  A full examination was performed including scalp, head, eyes, ears, nose, lips, neck, chest, axillae, abdomen, back, buttocks, bilateral upper extremities, bilateral lower extremities, hands, feet, fingers, toes, fingernails, and toenails. All findings within normal limits unless otherwise noted below.  face, eyes Erythema of the face and eyes  R ear x 1 Pink scaly macules  R cheek x 1, R back x 1 (2) Stuck on waxy paps with erythema   Assessment & Plan   Lentigines - Scattered tan macules - Due to sun exposure - Benign-appearing, observe - Recommend daily broad spectrum sunscreen SPF 30+ to sun-exposed areas, reapply every 2 hours as needed. - Call for any changes  Seborrheic Keratoses - Stuck-on, waxy, tan-brown papules and/or plaques  - Benign-appearing - Discussed benign etiology and prognosis. - Observe - Call for any changes  Melanocytic Nevi - Tan-brown and/or pink-flesh-colored symmetric macules and papules - Benign appearing on exam today - Observation - Call clinic for new or changing moles - Recommend daily use of broad spectrum spf 30+ sunscreen to sun-exposed areas.   Hemangiomas - Red papules - Discussed  benign nature - Observe - Call for any changes  Actinic Damage - Chronic condition, secondary to cumulative UV/sun exposure - diffuse scaly erythematous macules with underlying dyspigmentation - Recommend daily broad spectrum sunscreen SPF 30+ to sun-exposed areas, reapply every 2 hours as needed.  - Staying in the shade or wearing long sleeves, sun glasses (UVA+UVB protection) and wide brim hats (4-inch brim around the entire circumference of the hat) are also recommended for sun protection.  - Call for new or changing lesions.  Skin cancer screening performed today.  History of Squamous Cell Carcinoma in Situ of the Skin - No evidence of recurrence today - Recommend regular full body skin exams - Recommend daily broad spectrum sunscreen SPF 30+ to sun-exposed areas, reapply every 2 hours as needed.  - Call if any new or changing lesions are noted between office visits  - L ant scalp, L proximal dorsum forearm  Rosacea With ocular rosacea face, eyes Rosacea is a chronic progressive skin condition usually affecting the face of adults, causing redness and/or acne bumps. It is treatable but not curable. It sometimes affects the eyes (ocular rosacea) as well. It may respond to topical and/or systemic medication and can flare with stress, sun exposure, alcohol, exercise and some foods.  Daily application of broad spectrum spf 30+ sunscreen to face is recommended to reduce flares.  Discussed Doxycycline  Start Doxycycline '20mg'$  1 po qd with dinner #90 4rfs  Doxycycline should be taken with food to prevent nausea. Do not lay down for 30 minutes after  taking. Be cautious with sun exposure and use good sun protection while on this medication. Pregnant women should not take this medication.    doxycycline (PERIOSTAT) 20 MG tablet - face, eyes Take 1 tablet (20 mg total) by mouth daily. 1 po qd in evening with dinner for Rosacea of face and eyes  AK (actinic keratosis) R ear x 1 Destruction  of lesion - R ear x 1 Complexity: simple   Destruction method: cryotherapy   Informed consent: discussed and consent obtained   Timeout:  patient name, date of birth, surgical site, and procedure verified Lesion destroyed using liquid nitrogen: Yes   Region frozen until ice ball extended beyond lesion: Yes   Outcome: patient tolerated procedure well with no complications   Post-procedure details: wound care instructions given    Inflamed seborrheic keratosis R cheek x 1, R back x 1 Destruction of lesion - R cheek x 1, R back x 1 Complexity: simple   Destruction method: cryotherapy   Informed consent: discussed and consent obtained   Timeout:  patient name, date of birth, surgical site, and procedure verified Lesion destroyed using liquid nitrogen: Yes   Region frozen until ice ball extended beyond lesion: Yes   Outcome: patient tolerated procedure well with no complications   Post-procedure details: wound care instructions given    Skin cancer screening  Return in about 1 year (around 11/18/2022) for TBSE, Hx of SCC IS, Hx of AKs.  I, Othelia Pulling, RMA, am acting as scribe for [name], MD . Documentation: I have reviewed the above documentation for accuracy and completeness, and I agree with the above.  [name], MD

## 2021-11-18 ENCOUNTER — Encounter: Payer: Self-pay | Admitting: Podiatry

## 2021-11-18 ENCOUNTER — Ambulatory Visit (INDEPENDENT_AMBULATORY_CARE_PROVIDER_SITE_OTHER): Payer: Medicare Other | Admitting: Podiatry

## 2021-11-18 DIAGNOSIS — M79674 Pain in right toe(s): Secondary | ICD-10-CM

## 2021-11-18 DIAGNOSIS — M79675 Pain in left toe(s): Secondary | ICD-10-CM | POA: Diagnosis not present

## 2021-11-18 DIAGNOSIS — B351 Tinea unguium: Secondary | ICD-10-CM | POA: Diagnosis not present

## 2021-11-18 DIAGNOSIS — D689 Coagulation defect, unspecified: Secondary | ICD-10-CM

## 2021-11-18 DIAGNOSIS — E1142 Type 2 diabetes mellitus with diabetic polyneuropathy: Secondary | ICD-10-CM

## 2021-11-18 NOTE — Progress Notes (Signed)
This patient returns to my office for at risk foot care.  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  Patient is taking eliquiss.  This patient is unable to cut nails himself since the patient cannot reach his nails.These nails are painful walking and wearing shoes.  This patient presents for at risk foot care today.  General Appearance  Alert, conversant and in no acute stress.  Vascular  Dorsalis pedis and posterior tibial  pulses are palpable  bilaterally.  Capillary return is within normal limits  bilaterally. Temperature is within normal limits  bilaterally.  Neurologic  Senn-Weinstein monofilament wire test diminished  bilaterally. Muscle power within normal limits bilaterally.  Nails Thick disfigured discolored nails with subungual debris  from hallux to fifth toes bilaterally. No evidence of bacterial infection or drainage bilaterally.  Orthopedic  No limitations of motion  feet .  No crepitus or effusions noted.  No bony pathology or digital deformities noted.  HAV  B/L.  Hammer toes  B/L. Resolved nail surgery left hallux toenail.  Necrotic tissue noted on nail bed. No drainage or infection.  Skin  normotropic skin with no porokeratosis noted bilaterally.  No signs of infections or ulcers noted.     Onychomycosis  Pain in right toes  Pain in left toes  Consent was obtained for treatment procedures.   Mechanical debridement of nails 1-5  bilaterally performed with a nail nipper.  Filed with dremel without incident.    Return office visit    10 weeks                 Told patient to return for periodic foot care and evaluation due to potential at risk complications.   Antario Yasuda DPM  

## 2021-12-01 ENCOUNTER — Encounter: Payer: Self-pay | Admitting: Dermatology

## 2021-12-31 DIAGNOSIS — T82198A Other mechanical complication of other cardiac electronic device, initial encounter: Secondary | ICD-10-CM | POA: Insufficient documentation

## 2022-01-27 ENCOUNTER — Ambulatory Visit (INDEPENDENT_AMBULATORY_CARE_PROVIDER_SITE_OTHER): Payer: Medicare Other | Admitting: Podiatry

## 2022-01-27 ENCOUNTER — Encounter: Payer: Self-pay | Admitting: Podiatry

## 2022-01-27 DIAGNOSIS — B351 Tinea unguium: Secondary | ICD-10-CM

## 2022-01-27 DIAGNOSIS — D689 Coagulation defect, unspecified: Secondary | ICD-10-CM

## 2022-01-27 DIAGNOSIS — M79675 Pain in left toe(s): Secondary | ICD-10-CM | POA: Diagnosis not present

## 2022-01-27 DIAGNOSIS — E1142 Type 2 diabetes mellitus with diabetic polyneuropathy: Secondary | ICD-10-CM

## 2022-01-27 DIAGNOSIS — M79674 Pain in right toe(s): Secondary | ICD-10-CM

## 2022-01-27 NOTE — Progress Notes (Signed)
This patient returns to my office for at risk foot care.  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  Patient is taking eliquiss.  This patient is unable to cut nails himself since the patient cannot reach his nails.These nails are painful walking and wearing shoes.  This patient presents for at risk foot care today.  General Appearance  Alert, conversant and in no acute stress.  Vascular  Dorsalis pedis and posterior tibial  pulses are palpable  bilaterally.  Capillary return is within normal limits  bilaterally. Temperature is within normal limits  bilaterally.  Neurologic  Senn-Weinstein monofilament wire test diminished  bilaterally. Muscle power within normal limits bilaterally.  Nails Thick disfigured discolored nails with subungual debris  from hallux to fifth toes bilaterally. No evidence of bacterial infection or drainage bilaterally.  Orthopedic  No limitations of motion  feet .  No crepitus or effusions noted.  No bony pathology or digital deformities noted.  HAV  B/L.  Hammer toes  B/L. Resolved nail surgery left hallux toenail.  Necrotic tissue noted on nail bed. No drainage or infection.  Skin  normotropic skin with no porokeratosis noted bilaterally.  No signs of infections or ulcers noted.     Onychomycosis  Pain in right toes  Pain in left toes  Consent was obtained for treatment procedures.   Mechanical debridement of nails 1-5  bilaterally performed with a nail nipper.  Filed with dremel without incident.    Return office visit    10 weeks                 Told patient to return for periodic foot care and evaluation due to potential at risk complications.   Gardiner Barefoot DPM

## 2022-02-02 ENCOUNTER — Ambulatory Visit (INDEPENDENT_AMBULATORY_CARE_PROVIDER_SITE_OTHER): Payer: Medicare Other | Admitting: Urology

## 2022-02-02 ENCOUNTER — Encounter: Payer: Self-pay | Admitting: Urology

## 2022-02-02 VITALS — BP 106/61 | HR 51 | Ht 67.0 in | Wt 219.0 lb

## 2022-02-02 DIAGNOSIS — N401 Enlarged prostate with lower urinary tract symptoms: Secondary | ICD-10-CM | POA: Diagnosis not present

## 2022-02-02 DIAGNOSIS — R350 Frequency of micturition: Secondary | ICD-10-CM

## 2022-02-02 DIAGNOSIS — R3915 Urgency of urination: Secondary | ICD-10-CM | POA: Diagnosis not present

## 2022-02-02 NOTE — Progress Notes (Signed)
02/02/22 12:09 PM   [name] Ferguson 29-Jun-1941 [id number]  Referring provider:  Kirk Ruths, MD [facility] [place],   [id number]  Chief Complaint  Patient presents with   Benign Prostatic Hypertrophy    Urological history  1. BPH with LU TS -PSA 2.6 in 01/2021 -I PSS 11/2 -managed with tamsulosin 0.4 mg daily   2. Prostate nodule -contrast CT 2017 - Enlarged prostate with small internal low attenuating structure that is not well visualized by CT; differential considerations include, but are not limited to a BPH nodule or sequela of prostatitis with possible tiny non drainable fluid collection  - stable on exam    3. Elevated PSA -PSA Trend 2.0 ng/mL on 09/07/2015             7.34 ng/mL on 06/10/2016                           Component     Latest Ref Rng & Units 06/27/2016 12/20/2016 12/26/2017 12/20/2018  Prostate Specific Ag, Serum     0.0 - 4.0 ng/mL 2.9 1.9 2.1 2.0    Component     Latest Ref Rng & Units 12/26/2019 01/18/2021  Prostate Specific Ag, Serum     0.0 - 4.0 ng/mL 2.1 2.6   HPI: [name] Ferguson is a 81 y.o.male who presents today for a 1 year follow-up with IPSS and exam.   He is accompanied by his wife. He reports that he continues to have frequency and urgency.  It is not bothersome to him at this time.   Patient denies any modifying or aggravating factors.  Patient denies any gross hematuria, dysuria or suprapubic/flank pain.  Patient denies any fevers, chills, nausea or vomiting.    IPSS     Row Name 02/02/22 1100         International Prostate Symptom Score   How often have you had the sensation of not emptying your bladder? Less than half the time     How often have you had to urinate less than every two hours? About half the time     How often have you found you stopped and started again several times when you urinated? Less than half the time     How often have you found it difficult to postpone  urination? About half the time     How often have you had a weak urinary stream? Not at All     How often have you had to strain to start urination? Not at All     How many times did you typically get up at night to urinate? 1 Time     Total IPSS Score 11       Quality of Life due to urinary symptoms   If you were to spend the rest of your life with your urinary condition just the way it is now how would you feel about that? 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   Stroke [facility])     Surgical History: Past Surgical History:  Procedure Laterality Date   BACK SURGERY     CARDIAC CATHETERIZATION     COLONOSCOPY     COLONOSCOPY WITH PROPOFOL N/A 09/22/2015   Procedure: COLONOSCOPY WITH PROPOFOL;  Surgeon: Lucilla Lame, MD;  Location: [facility] ENDOSCOPY;  Service: Endoscopy;  Laterality: N/A;   CORONARY ARTERY BYPASS GRAFT  1996   HEMORRHOID SURGERY  2007   HEMORRHOID SURGERY  08-12-15   excision internal hemorrhoid Dr Jamal Collin   JOINT REPLACEMENT Left 2004   Knee    Home Medications:  Allergies as of 02/02/2022       Reactions   Statins Other (See Comments)   Suspected cause of muscle pain        Medication List        Accurate as of February 02, 2022 12:09 PM. If you have any questions, ask your nurse or doctor.          acyclovir 400 MG  tablet Commonly known as: ZOVIRAX TK 1 T PO TID FOR 5 DAYS FOR EACH FEVER BLISTER OUTBREAK   amiodarone 200 MG tablet Commonly known as: PACERONE Take 1.5 tablets by mouth daily.   Aspirin-Calcium Carbonate 81-777 MG Tabs Take by mouth.   Besifloxacin HCl 0.6 % Susp Apply to eye.   brimonidine 0.15 % ophthalmic solution Commonly known as: ALPHAGAN Place 1 drop into the left eye 2 (two) times daily.   Brinzolamide-Brimonidine 1-0.2 % Susp Apply 1 drop to eye 2 (two) times daily.   clopidogrel 75 MG tablet Commonly known as: PLAVIX Take 1 tablet by mouth daily.   dorzolamide 2 % ophthalmic solution Commonly known as: TRUSOPT Administer 2 drops to both eyes Two (2) times a day.   doxycycline 20 MG tablet Commonly known as: PERIOSTAT Take 1 tablet (20 mg total) by mouth daily. 1 po qd in evening with dinner for Rosacea of face and eyes   Eliquis 5 MG Tabs tablet Generic drug: apixaban   Eliquis 5 MG Tabs tablet Generic drug: apixaban Take 1 tablet by mouth 2 (two) times daily.   ezetimibe 10 MG tablet Commonly known as: ZETIA Take 1 tablet by mouth daily.   Fluad 0.5 ML Susy Generic drug: Influenza Vac A&B Surf Ant Adj TO BE ADMINISTERED BY PHARMACIST FOR IMMUNIZATION   fluticasone 50 MCG/ACT nasal spray Commonly known as: FLONASE U 1 SPR NASALLY BID   glucagon 1 MG injection Use as directed for emergent low blood sugar,   Glucosamine HCl 1500 MG Tabs   glucosamine-chondroitin 500-400 MG tablet Take 1 tablet by mouth daily.   glucose blood test strip TEST 3 TIMES DAILY   HYDROcodone-acetaminophen 5-325 MG tablet Commonly known as: NORCO/VICODIN Take 1 tablet by mouth every 8 (eight) hours as needed.   insulin detemir 100 UNIT/ML FlexPen Commonly known as: LEVEMIR Inject 35-40 Units into the skin at bedtime. Says 50 units is prescribed but will usually take 35-40 at night   insulin glargine 100 UNIT/ML Solostar Pen Commonly known as: LANTUS Inject  into the skin.   Invokana 100 MG Tabs tablet Generic drug: canagliflozin  canagliflozin 100 MG Tabs tablet Commonly known as: INVOKANA Take by mouth.   isosorbide mononitrate 30 MG 24 hr tablet Commonly known as: IMDUR Take 30 mg by mouth daily.   Klor-Con M20 20 MEQ tablet Generic drug: potassium chloride SA Take 20 mEq by mouth 2 (two) times daily.   latanoprost 0.005 % ophthalmic solution Commonly known as: XALATAN   levothyroxine 50 MCG tablet Commonly known as: SYNTHROID PLEASE SEE ATTACHED FOR DETAILED DIRECTIONS   levothyroxine 100 MCG tablet Commonly known as: SYNTHROID Take 100 mcg by mouth daily.   lisinopril 10 MG tablet Commonly known as: ZESTRIL Take 10 mg by mouth every morning.   metFORMIN 500 MG 24 hr tablet Commonly known as: GLUCOPHAGE-XR Take 1,000 mg by mouth 2 (two) times daily.   metoprolol succinate 25 MG 24 hr tablet Commonly known as: TOPROL-XL Take 1 tablet by mouth daily.   MULTIVITAMIN ADULT PO Take by mouth.   mupirocin ointment 2 % Commonly known as: BACTROBAN Apply 1 application topically daily. Qd to excision site   nitroGLYCERIN 0.4 MG SL tablet Commonly known as: NITROSTAT Place under the tongue.   OMEGA-3 FISH OIL PO Take by mouth.   OneTouch Delica Lancets 82L Misc USE 1 LANCET 3 TIMES A DAY   OneTouch Verio IQ System w/Device Kit U UTD TO CHECK BS QID   PEN NEEDLES 31GX5/16" 31G X 8 MM Misc as directed   potassium chloride 10 MEQ tablet Commonly known as: KLOR-CON Take 10 mEq by mouth daily.   pravastatin 40 MG tablet Commonly known as: PRAVACHOL Take 1 tablet by mouth every evening.   prednisoLONE acetate 1 % ophthalmic suspension Commonly known as: PRED FORTE PLACE 1 DROP INTO THE RIGHT EYE 6 (SIX) TIMES DAILY   Repatha SureClick 078 MG/ML Soaj Generic drug: Evolocumab Inject 1 mL into the skin every 14 (fourteen) days.   tamsulosin 0.4 MG Caps capsule Commonly known as: FLOMAX Take 1 capsule by  mouth daily as needed.   torsemide 20 MG tablet Commonly known as: DEMADEX Take by mouth.   Tradjenta 5 MG Tabs tablet Generic drug: linagliptin   triamcinolone cream 0.1 % Commonly known as: KENALOG Apply topically.   UNABLE TO FIND Take by mouth.   Vascepa 0.5 g Caps Generic drug: Icosapent Ethyl Take 4 capsules by mouth 2 (two) times daily.   vitamin B-12 500 MCG tablet Commonly known as: CYANOCOBALAMIN Take 500 mcg by mouth daily.   vitamin C 500 MG tablet Commonly known as: ASCORBIC ACID Take 500 mg by mouth daily.   ascorbic acid 1000 MG tablet Commonly known as: VITAMIN C Take 1 tablet by mouth daily.        Allergies:  Allergies  Allergen Reactions   Statins Other (See Comments)    Suspected cause of muscle pain    Family History: Family History  Problem Relation Age of Onset   Diabetes Mother    Alzheimer's disease Mother    Cancer Sister    Heart disease Other        Family members in general   Diabetes Brother    Prostate cancer Neg Hx    Bladder Cancer Neg Hx    Kidney disease Neg Hx     Social History:  reports that he quit smoking about 40 years ago. His smoking use included cigarettes. He has never used smokeless tobacco. He reports current alcohol use. He reports that he does not use drugs.   Physical Exam: BP 106/61  Pulse (!) 51   Ht _0  (1.702 m)   Wt 219 lb (99.3 kg)   BMI 34.30 kg/m   Constitutional:  Alert and oriented, No acute distress. HEENT: [place] AT, moist mucus membranes.  Trachea midline, no masses. Cardiovascular: No clubbing, cyanosis, or edema. Respiratory: Normal respiratory effort, no increased work of breathing. GU: No CVA tenderness.  No bladder fullness or masses.  Patient with uncircumcised phallus. Foreskin easily retracted  Urethral meatus is patent.  No penile discharge. No penile lesions or rashes. Scrotum without lesions, cysts, rashes and/or edema.  Bilateral hydroceles, testicles are located scrotally  bilaterally. No masses are appreciated in the testicles. Left and right epididymis are normal. Bilateral hydroceles  Rectal: Patient with  normal sphincter tone. Anus and perineum without scarring or rashes. No rectal masses are appreciated. Prostate is approximately 50 grams, could only palpate the apex, no nodules are appreciated. Seminal vesicles could not be palpated Neurologic: Grossly intact, no focal deficits, moving all 4 extremities. Psychiatric: Normal mood and affect.  Laboratory Data:  Ref Range & Units 1 mo ago  Glucose 70 - 110 mg/dL 118 High    Sodium 136 - 145 mmol/L 137   Potassium 3.6 - 5.1 mmol/L 4.2   Chloride 97 - 109 mmol/L 100   Carbon Dioxide (CO2) 22.0 - 32.0 mmol/L 28.7   Urea Nitrogen (BUN) 7 - 25 mg/dL 34 High    Creatinine 0.7 - 1.3 mg/dL 1.7 High    Glomerular Filtration Rate (eGFR), MDRD Estimate >60 mL/min/1.73sq m 39 Low    Calcium 8.7 - 10.3 mg/dL 9.1   AST  8 - 39 U/L 33   ALT  6 - 57 U/L 33   Alk Phos (alkaline Phosphatase) 34 - 104 U/L 66   Albumin 3.5 - 4.8 g/dL 4.3   Bilirubin, Total 0.3 - 1.2 mg/dL 0.8   Protein, Total 6.1 - 7.9 g/dL 6.6   A/G Ratio 1.0 - 5.0 gm/dL 1.9   Resulting Agency  [facility]    Ref Range & Units 1 mo ago  Hemoglobin A1C 4.2 - 5.6 % 6.6 High    Average Blood Glucose (Calc) mg/dL 143   Resulting [facility]  Narrative Performed by [facility] Normal Range:    4.2 - 5.6%  Increased Risk:  5.7 - 6.4%  Diabetes:        >= 6.5%  Glycemic Control for adults with diabetes:  <7%    Specimen Collected: 12/15/21 07:51   Performed by: [facility] Last Resulted: 12/15/21 10:32  Received From: [place]  Result Received: 02/01/22 14:36   Received Information  Order: [id number]  Ref Range & Units 1 mo ago  Creatinine, Random Urine 40.0 - 300.0 mg/dL 17.8 Low    Urine Albumin, Random mg/L <7   Urine Albumin/Creatinine Ratio <30.0 ug/mg  <39.3 High       Assessment & Plan:    1. BPH with LU TS -continues to have issues with frequency and urgency but it is likely due to his diuretics -continue the tamsulosin 0.4 mg daily -script sent to pharmacy   Return in about 1 year (around 02/03/2023) for I PSS/exam.  [facility] [address] [place], [place] [address] [phone]  I, Kirke Shaggy Littlejohn,acting as a scribe for [facility], PA-C.,have documented all relevant documentation on the behalf of [facility], PA-C,as directed by  Chitara Clonch, PA-C while in the presence of Alyzza Andringa, PA-C.  I have reviewed the above documentation for accuracy and completeness, and I agree with the above.    Zara Council, PA-C

## 2022-03-12 ENCOUNTER — Other Ambulatory Visit: Payer: Self-pay | Admitting: Urology

## 2022-04-28 ENCOUNTER — Encounter: Payer: Self-pay | Admitting: Podiatry

## 2022-04-28 ENCOUNTER — Ambulatory Visit (INDEPENDENT_AMBULATORY_CARE_PROVIDER_SITE_OTHER): Payer: Medicare Other | Admitting: Podiatry

## 2022-04-28 DIAGNOSIS — M79674 Pain in right toe(s): Secondary | ICD-10-CM

## 2022-04-28 DIAGNOSIS — E1142 Type 2 diabetes mellitus with diabetic polyneuropathy: Secondary | ICD-10-CM

## 2022-04-28 DIAGNOSIS — M79675 Pain in left toe(s): Secondary | ICD-10-CM | POA: Diagnosis not present

## 2022-04-28 DIAGNOSIS — D689 Coagulation defect, unspecified: Secondary | ICD-10-CM

## 2022-04-28 DIAGNOSIS — B351 Tinea unguium: Secondary | ICD-10-CM | POA: Diagnosis not present

## 2022-04-28 NOTE — Progress Notes (Signed)
This patient returns to my office for at risk foot care.  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  Patient is taking eliquiss.  This patient is unable to cut nails himself since the patient cannot reach his nails.These nails are painful walking and wearing shoes.  This patient presents for at risk foot care today.  General Appearance  Alert, conversant and in no acute stress.  Vascular  Dorsalis pedis and posterior tibial  pulses are palpable  bilaterally.  Capillary return is within normal limits  bilaterally. Temperature is within normal limits  bilaterally.  Neurologic  Senn-Weinstein monofilament wire test diminished  bilaterally. Muscle power within normal limits bilaterally.  Nails Thick disfigured discolored nails with subungual debris  from hallux to fifth toes bilaterally. No evidence of bacterial infection or drainage bilaterally.  Orthopedic  No limitations of motion  feet .  No crepitus or effusions noted.  No bony pathology or digital deformities noted.  HAV  B/L.  Hammer toes  B/L. No drainage or infection.  Skin  normotropic skin with no porokeratosis noted bilaterally.  No signs of infections or ulcers noted.     Onychomycosis  Pain in right toes  Pain in left toes  Consent was obtained for treatment procedures.   Mechanical debridement of nails 1-5  bilaterally performed with a nail nipper.  Filed with dremel without incident.    Return office visit    10 weeks                 Told patient to return for periodic foot care and evaluation due to potential at risk complications.  Casted fro diabetic shoes.   Gardiner Barefoot DPM

## 2022-06-15 ENCOUNTER — Other Ambulatory Visit: Payer: Self-pay | Admitting: Urology

## 2022-06-16 ENCOUNTER — Telehealth: Payer: Self-pay | Admitting: Podiatry

## 2022-06-16 NOTE — Telephone Encounter (Signed)
Lmom for patient to call back and schedule picking up diabetic shoes - shoes are being sent to [facility] 06/16/22

## 2022-06-27 ENCOUNTER — Ambulatory Visit (INDEPENDENT_AMBULATORY_CARE_PROVIDER_SITE_OTHER): Payer: Medicare Other | Admitting: Podiatry

## 2022-06-27 ENCOUNTER — Encounter: Payer: Self-pay | Admitting: Podiatry

## 2022-06-27 ENCOUNTER — Ambulatory Visit (INDEPENDENT_AMBULATORY_CARE_PROVIDER_SITE_OTHER): Payer: Medicare Other | Admitting: Internal Medicine

## 2022-06-27 VITALS — BP 151/76

## 2022-06-27 VITALS — BP 121/66 | HR 63 | Resp 16 | Ht 67.0 in | Wt 215.0 lb

## 2022-06-27 DIAGNOSIS — M2041 Other hammer toe(s) (acquired), right foot: Secondary | ICD-10-CM

## 2022-06-27 DIAGNOSIS — E1142 Type 2 diabetes mellitus with diabetic polyneuropathy: Secondary | ICD-10-CM

## 2022-06-27 DIAGNOSIS — G4733 Obstructive sleep apnea (adult) (pediatric): Secondary | ICD-10-CM

## 2022-06-27 DIAGNOSIS — M2042 Other hammer toe(s) (acquired), left foot: Secondary | ICD-10-CM | POA: Diagnosis not present

## 2022-06-27 DIAGNOSIS — Z7189 Other specified counseling: Secondary | ICD-10-CM

## 2022-06-27 DIAGNOSIS — M2010 Hallux valgus (acquired), unspecified foot: Secondary | ICD-10-CM

## 2022-06-27 DIAGNOSIS — D689 Coagulation defect, unspecified: Secondary | ICD-10-CM

## 2022-06-27 NOTE — Patient Instructions (Signed)

## 2022-06-27 NOTE — Progress Notes (Signed)
This patient returns to my office for at risk foot care.  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  Patient is taking eliquiss.  This patient presents to pick up his diabetic shoes.  General Appearance  Alert, conversant and in no acute stress.  Vascular  Dorsalis pedis and posterior tibial  pulses are palpable  bilaterally.  Capillary return is within normal limits  bilaterally. Temperature is within normal limits  bilaterally.  Neurologic  Senn-Weinstein monofilament wire test diminished  bilaterally. Muscle power within normal limits bilaterally.  Nails Thick disfigured discolored nails with subungual debris  from hallux to fifth toes bilaterally. No evidence of bacterial infection or drainage bilaterally.  Orthopedic  No limitations of motion  feet .  No crepitus or effusions noted.  No bony pathology or digital deformities noted.  HAV  B/L.  Hammer toes  B/L. No drainage or infection.  Skin  normotropic skin with no porokeratosis noted bilaterally.  No signs of infections or ulcers noted.     Diabetic neuropathy.  Consent was obtained for treatment procedures.   Diabetic shoes dispensed    Return office visit   prn                Told patient to return for periodic foot care and evaluation due to potential at risk complications.     Gardiner Barefoot DPM

## 2022-06-27 NOTE — Progress Notes (Signed)
[facility], [place] [address]  Pulmonary Sleep Medicine   Office Visit Note  Patient Name: [name] Ferguson DOB: [birth year] MRN [id number]    Chief Complaint: Obstructive Sleep Apnea visit  Brief History:  [name] Ferguson is seen today for an annual follow up on BIPAP auto. The patient has a 15 year history of sleep apnea. Patient is using PAP nightly.  The patient feels rested after sleeping with PAP.  The patient reports benefiting from PAP use. Reported sleepiness is  improved and the Epworth Sleepiness Score is 9 out of 24. The patient does take naps, about 3-4 times for about an hour. The patient complains of the following: No complaints.  The compliance download shows 98% compliance with an average use time of 7 hours 10 minutes. The AHI is 2.3.  The patient does not complain of limb movements disrupting sleep.  ROS  General: (-) fever, (-) chills, (-) night sweat Nose and Sinuses: (-) nasal stuffiness or itchiness, (-) postnasal drip, (-) nosebleeds, (-) sinus trouble. Mouth and Throat: (-) sore throat, (-) hoarseness. Neck: (-) swollen glands, (-) enlarged thyroid, (-) neck pain. Respiratory: - cough, - shortness of breath, - wheezing. Neurologic: - numbness, - tingling. Psychiatric: - anxiety, - depression   Current Medication: Outpatient Encounter Medications as of 06/27/2022  Medication Sig   acyclovir (ZOVIRAX) 400 MG tablet TK 1 T PO TID FOR 5 DAYS FOR EACH FEVER BLISTER OUTBREAK   amiodarone (PACERONE) 200 MG tablet Take 1.5 tablets by mouth daily.   apixaban (ELIQUIS) 5 MG TABS tablet Take 1 tablet by mouth 2 (two) times daily.   ascorbic acid (VITAMIN C) 1000 MG tablet Take 1 tablet by mouth daily.   Aspirin-Calcium Carbonate 81-777 MG TABS Take by mouth.   Besifloxacin HCl 0.6 % SUSP Apply to eye.   Blood Glucose Monitoring Suppl (ONETOUCH VERIO IQ SYSTEM) w/Device KIT U UTD TO CHECK BS QID   brimonidine (ALPHAGAN) 0.15 % ophthalmic  solution Place 1 drop into the left eye 2 (two) times daily.   Brinzolamide-Brimonidine 1-0.2 % SUSP Apply 1 drop to eye 2 (two) times daily.   canagliflozin (INVOKANA) 100 MG TABS tablet Take by mouth.   dorzolamide (TRUSOPT) 2 % ophthalmic solution Administer 2 drops to both eyes Two (2) times a day.   ELIQUIS 5 MG TABS tablet    ezetimibe (ZETIA) 10 MG tablet Take 1 tablet by mouth daily.   FLUAD 0.5 ML SUSY TO BE ADMINISTERED BY PHARMACIST FOR IMMUNIZATION   fluticasone (FLONASE) 50 MCG/ACT nasal spray U 1 SPR NASALLY BID   glucagon 1 MG injection Use as directed for emergent low blood sugar,   Glucosamine HCl 1500 MG TABS    glucosamine-chondroitin 500-400 MG tablet Take 1 tablet by mouth daily.   glucose blood test strip TEST 3 TIMES DAILY   HYDROcodone-acetaminophen (NORCO/VICODIN) 5-325 MG tablet Take 1 tablet by mouth every 8 (eight) hours as needed.   insulin detemir (LEVEMIR) 100 UNIT/ML FlexPen Inject 35-40 Units into the skin at bedtime. Says 50 units is prescribed but will usually take 35-40 at night   insulin glargine (LANTUS) 100 UNIT/ML Solostar Pen Inject into the skin.   Insulin Pen Needle (PEN NEEDLES 31GX5/16") 31G X 8 MM MISC as directed   INVOKANA 100 MG TABS tablet    isosorbide mononitrate (IMDUR) 30 MG 24 hr tablet Take 30 mg by mouth daily.    KLOR-CON M20 20 MEQ tablet Take 20 mEq by mouth  2 (two) times daily.   latanoprost (XALATAN) 0.005 % ophthalmic solution    levothyroxine (SYNTHROID) 100 MCG tablet Take 100 mcg by mouth daily.   levothyroxine (SYNTHROID) 50 MCG tablet PLEASE SEE ATTACHED FOR DETAILED DIRECTIONS   lisinopril (ZESTRIL) 10 MG tablet Take 10 mg by mouth every morning.   metFORMIN (GLUCOPHAGE-XR) 500 MG 24 hr tablet Take 1,000 mg by mouth 2 (two) times daily.   metoprolol succinate (TOPROL-XL) 25 MG 24 hr tablet Take 1 tablet by mouth daily.   Multiple Vitamins-Minerals (MULTIVITAMIN ADULT PO) Take by mouth.   mupirocin ointment (BACTROBAN) 2 %  Apply 1 application topically daily. Qd to excision site   nitroGLYCERIN (NITROSTAT) 0.4 MG SL tablet Place under the tongue.   Omega-3 Fatty Acids (OMEGA-3 FISH OIL PO) Take by mouth.   ONETOUCH DELICA LANCETS 38G MISC USE 1 LANCET 3 TIMES A DAY   potassium chloride (KLOR-CON) 10 MEQ tablet Take 10 mEq by mouth daily.   pravastatin (PRAVACHOL) 40 MG tablet Take 1 tablet by mouth every evening.   prednisoLONE acetate (PRED FORTE) 1 % ophthalmic suspension PLACE 1 DROP INTO THE RIGHT EYE 6 (SIX) TIMES DAILY   REPATHA SURECLICK 536 MG/ML SOAJ Inject 1 mL into the skin every 14 (fourteen) days.   tamsulosin (FLOMAX) 0.4 MG CAPS capsule TAKE 1 CAPSULE(0.4 MG) BY MOUTH DAILY   torsemide (DEMADEX) 20 MG tablet Take by mouth.   TRADJENTA 5 MG TABS tablet    triamcinolone cream (KENALOG) 0.1 % Apply topically.   TRULICITY 4.68 EH/2.1YY SOPN Inject 0.75 mg into the skin once a week.   UNABLE TO FIND Take by mouth.   VASCEPA 0.5 g CAPS Take 4 capsules by mouth 2 (two) times daily.   vitamin B-12 (CYANOCOBALAMIN) 500 MCG tablet Take 500 mcg by mouth daily.   vitamin C (ASCORBIC ACID) 500 MG tablet Take 500 mg by mouth daily.   [DISCONTINUED] clopidogrel (PLAVIX) 75 MG tablet Take 1 tablet by mouth daily.   No facility-administered encounter medications on file as of 06/27/2022.    Surgical History: Past Surgical History:  Procedure Laterality Date   BACK SURGERY     CARDIAC CATHETERIZATION     COLONOSCOPY     COLONOSCOPY WITH PROPOFOL N/A 09/22/2015   Procedure: COLONOSCOPY WITH PROPOFOL;  Surgeon: Lucilla Lame, MD;  Location: [facility] ENDOSCOPY;  Service: Endoscopy;  Laterality: N/A;   CORONARY ANGIOPLASTY WITH STENT PLACEMENT     CORONARY ARTERY BYPASS GRAFT  07/11/1994   HEMORRHOID SURGERY  07/11/2005   HEMORRHOID SURGERY  08/12/2015   excision internal hemorrhoid Dr Jamal Collin   JOINT REPLACEMENT Left 07/11/2002   Knee    Medical History: Past Medical History:  Diagnosis Date   Actinic  keratosis    Angina pectoris ([place])    Atherosclerosis with limb claudication (HCC)    BPH (benign prostatic hypertrophy) with urinary obstruction    CHF (congestive heart failure) (HCC)    Diabetes mellitus without complication (HCC)    HLD (hyperlipidemia)    Hypertension    Obesity    Penile adhesions    Sleep apnea    SOB (shortness of breath)    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   Stroke [facility])     Family History: Non contributory to the present illness  Social History: Social History   Socioeconomic History  Marital status: Married    Spouse name: Not on file   Number of children: Not on file   Years of education: Not on file   Highest education level: Not on file  Occupational History   Not on file  Tobacco Use   Smoking status: Former    Types: Cigarettes    Quit date: 07/11/1981    Years since quitting: 40.9   Smokeless tobacco: Never  Substance and Sexual Activity   Alcohol use: Yes    Comment: occasionally   Drug use: No   Sexual activity: Not on file  Other Topics Concern   Not on file  Social History Narrative   Not on file   Social Determinants of Health   Financial Resource Strain: Not on file  Food Insecurity: Not on file  Transportation Needs: Not on file  Physical Activity: Not on file  Stress: Not on file  Social Connections: Not on file  Intimate Partner Violence: Not on file    Vital Signs: Blood pressure 121/66, pulse 63, resp. rate 16, height _0  (1.702 m), weight 215 lb (97.5 kg), SpO2 98 %. Body mass index is 33.67 kg/m.    Examination: General Appearance: The patient is well-developed, well-nourished, and in no distress. Neck Circumference: 46 cm Skin: Gross inspection of skin unremarkable. Head: normocephalic, no gross deformities. Eyes: no gross deformities noted. ENT: ears  appear grossly normal Neurologic: Alert and oriented. No involuntary movements.  STOP BANG RISK ASSESSMENT S (snore) Have you been told that you snore?     NO   T (tired) Are you often tired, fatigued, or sleepy during the day?   NO  O (obstruction) Do you stop breathing, choke, or gasp during sleep? NO   P (pressure) Do you have or are you being treated for high blood pressure? YES   B (BMI) Is your body index greater than 35 kg/m? NO   A (age) Are you 20 years old or older? YES   N (neck) Do you have a neck circumference greater than 16 inches?   YES   G (gender) Are you a male? YES   TOTAL STOP/BANG "YES" ANSWERS 4       A STOP-Bang score of 2 or less is considered low risk, and a score of 5 or more is high risk for having either moderate or severe OSA. For people who score 3 or 4, doctors may need to perform further assessment to determine how likely they are to have OSA.         EPWORTH SLEEPINESS SCALE:  Scale:  (0)= no chance of dozing; (1)= slight chance of dozing; (2)= moderate chance of dozing; (3)= high chance of dozing  Chance  Situtation    Sitting and reading: 0    Watching TV: 2    Sitting Inactive in public: 2    As a passenger in car: 2      Lying down to rest: 2    Sitting and talking: 0    Sitting quielty after lunch: 1    In a car, stopped in traffic: 0   TOTAL SCORE:   9 out of 24    SLEEP STUDIES:  SPLIT (02/06/07) AHI 42, min SPO2 79%   CPAP COMPLIANCE DATA:  Date Range: 06/23/21 - 06/22/22  Average Daily Use: 7 hours 10 minutes  Median Use: 7 hours 10 minutes  Compliance for > 4 Hours: 357 days  AHI: 2.3 respiratory events per hour  Days Used:  358/365  Mask Leak: 41.8  95th Percentile Pressure: 25/10 cmh20         LABS: No results found for this or any previous visit (from the past 2160 hour(s)).  Radiology: DG Hand Complete Right  Result Date: 05/16/2020 CLINICAL DATA:  Status post fall. Laceration to  fifth digit and swelling throughout the metacarpal bones. EXAM: RIGHT HAND - COMPLETE 3+ VIEW COMPARISON:  None FINDINGS: Marked dorsal soft tissue swelling is identified centered over the metacarpal phalangeal joints. There is a deformity involving the head of the third metacarpal bone. On the lateral radiograph there is an ossified density along the dorsal aspect of the head of the metatarsal bones which may reflect a displaced fracture fragment. Mild degenerative changes are noted within the DIP joints. IMPRESSION: 1. Marked dorsal soft tissue swelling. Deformity of the head of the third metatarsal bone is noted with suspicious ossific density along the dorsal aspect of the metacarpal heads, which is concerning for a displaced fracture fragment. 2. Osteoarthritis of the DIP joints. Electronically Signed   By: Kerby Moors M.D.   On: 05/16/2020 11:32    No results found.  No results found.    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   Abnormal TSH 06/26/2019   Acquired hypothyroidism 06/26/2019   History of cardiac arrest 04/10/2019   Stage 3a chronic kidney disease ([place]) 04/10/2019   Coagulation disorder ([place]) 03/28/2019   Cardiac arrest with ventricular fibrillation ([place]) 03/18/2019   Moderate right ventricular systolic dysfunction 16/04/9603   Pain due to onychomycosis of toenails of both feet 01/17/2019   Diabetic neuropathy ([place]) 01/17/2019   On continuous oral anticoagulation 12/24/2018   Paroxysmal atrial fibrillation ([place]) 12/24/2018   Primary open angle glaucoma of both eyes, severe stage  12/04/2018   Pseudophakia of both eyes 12/04/2018   Vision changes 12/04/2018   Stroke ([place]) 06/26/2017   Intramuscular hematoma 04/13/2017   Acute on chronic diastolic heart failure ([place]) 10/25/2016   OSA on CPAP 10/25/2016   Chest pain, atypical 10/24/2016   Shortness of breath 10/24/2016   Bacteremia due to Gram-negative bacteria 06/07/2016   Hypokalemia 06/07/2016   Hypomagnesemia 06/07/2016   Diarrhea, unspecified 06/06/2016   Thrombocytopenia ([place]) 06/06/2016   Sepsis due to urinary tract infection ([place]) 06/05/2016   BPH with obstruction/lower urinary tract symptoms 09/22/2015   Special screening for malignant neoplasms, colon    Rectal polyp    Adiposity 03/18/2014   Heart failure ([place]) 11/07/2013   Type 2 diabetes mellitus treated with insulin ([place]) 04/08/2011   HLD (hyperlipidemia) 04/08/2011   Arthritis of knee, degenerative 04/08/2011   Diabetes ([place]) 05/15/2007   HYPERLIPIDEMIA 05/15/2007   Essential hypertension 05/15/2007   Coronary atherosclerosis 05/15/2007   DYSPNEA ON EXERTION 05/15/2007   TOBACCO ABUSE, HX OF 05/15/2007   CORONARY ARTERY BYPASS GRAFT, HX OF 05/15/2007    1. OSA treated with BiPAP The patient does tolerate PAP and reports  benefit from PAP use. The patient was reminded how to clean equipment and advised to replace supplies routinely. The patient was also counselled on weight loss. The compliance is excellent. The AHI is 2.3.  OSA treated with bipap- continue with excellent compliance. F/u one year.    2. Encounter for BiPAP use counseling  Counseling: had a lengthy discussion with the patient regarding the importance of PAP therapy in management of the sleep apnea. Patient appears to understand the risk factor reduction and also understands the risks associated with untreated sleep apnea. Patient will try to make a good faith effort to remain compliant with therapy. Also instructed the patient on proper cleaning of the device including the  water must be changed daily if possible and use of distilled water is preferred. Patient understands that the machine should be regularly cleaned with appropriate recommended cleaning solutions that do not damage the PAP machine for example given white vinegar and water rinses. Other methods such as ozone treatment may not be as good as these simple methods to achieve cleaning.    General Counseling: I have discussed the findings of the evaluation and examination with Iona Beard.  I have also discussed any further diagnostic evaluation thatmay be needed or ordered today. Leviticus verbalizes understanding of the findings of todays visit. We also reviewed his medications today and discussed drug interactions and side effects including but not limited excessive drowsiness and altered mental states. We also discussed that there is always a risk not just to him but also people around him. he has been encouraged to call the office with any questions or concerns that should arise related to todays visit.  No orders of the defined types were placed in this encounter.       I have personally obtained a history, examined the patient, evaluated laboratory and imaging results, formulated the assessment and plan and placed orders. This patient was seen today by Tressie Ellis, PA-C in collaboration with Dr. Devona Konig.   Allyne Gee, MD [facility] Diplomate ABMS Pulmonary Critical Care Medicine and Sleep Medicine

## 2022-08-04 ENCOUNTER — Ambulatory Visit (INDEPENDENT_AMBULATORY_CARE_PROVIDER_SITE_OTHER): Payer: Medicare Other | Admitting: Podiatry

## 2022-08-04 ENCOUNTER — Ambulatory Visit: Payer: Medicare Other | Admitting: Podiatry

## 2022-08-04 ENCOUNTER — Encounter: Payer: Self-pay | Admitting: Podiatry

## 2022-08-04 VITALS — BP 135/68 | HR 73

## 2022-08-04 DIAGNOSIS — M79675 Pain in left toe(s): Secondary | ICD-10-CM | POA: Diagnosis not present

## 2022-08-04 DIAGNOSIS — B351 Tinea unguium: Secondary | ICD-10-CM | POA: Diagnosis not present

## 2022-08-04 DIAGNOSIS — E1142 Type 2 diabetes mellitus with diabetic polyneuropathy: Secondary | ICD-10-CM | POA: Diagnosis not present

## 2022-08-04 DIAGNOSIS — M79674 Pain in right toe(s): Secondary | ICD-10-CM | POA: Diagnosis not present

## 2022-08-04 NOTE — Progress Notes (Signed)
This patient returns to my office for at risk foot care.  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  Patient is taking eliquiss.  This patient is unable to cut nails himself since the patient cannot reach his nails.These nails are painful walking and wearing shoes.  This patient presents for at risk foot care today.  General Appearance  Alert, conversant and in no acute stress.  Vascular  Dorsalis pedis and posterior tibial  pulses are palpable  bilaterally.  Capillary return is within normal limits  bilaterally. Temperature is within normal limits  bilaterally.  Neurologic  Senn-Weinstein monofilament wire test diminished  bilaterally. Muscle power within normal limits bilaterally.  Nails Thick disfigured discolored nails with subungual debris  from hallux to fifth toes bilaterally. No evidence of bacterial infection or drainage bilaterally.  Orthopedic  No limitations of motion  feet .  No crepitus or effusions noted.  No bony pathology or digital deformities noted.  HAV  B/L.  Hammer toes  B/L. No drainage or infection.  Skin  normotropic skin with no porokeratosis noted bilaterally.  No signs of infections or ulcers noted.     Onychomycosis  Pain in right toes  Pain in left toes  Consent was obtained for treatment procedures.   Mechanical debridement of nails 1-5  bilaterally performed with a nail nipper.  Filed with dremel without incident.    Return office visit    10 weeks                 Told patient to return for periodic foot care and evaluation due to potential at risk complications.   Gardiner Barefoot DPM

## 2022-08-09 ENCOUNTER — Other Ambulatory Visit: Payer: Self-pay | Admitting: Urology

## 2022-11-03 ENCOUNTER — Ambulatory Visit (INDEPENDENT_AMBULATORY_CARE_PROVIDER_SITE_OTHER): Payer: Medicare Other | Admitting: Podiatry

## 2022-11-03 ENCOUNTER — Encounter: Payer: Self-pay | Admitting: Podiatry

## 2022-11-03 DIAGNOSIS — B351 Tinea unguium: Secondary | ICD-10-CM

## 2022-11-03 DIAGNOSIS — M79675 Pain in left toe(s): Secondary | ICD-10-CM

## 2022-11-03 DIAGNOSIS — M79674 Pain in right toe(s): Secondary | ICD-10-CM | POA: Diagnosis not present

## 2022-11-03 DIAGNOSIS — E1142 Type 2 diabetes mellitus with diabetic polyneuropathy: Secondary | ICD-10-CM

## 2022-11-03 NOTE — Progress Notes (Signed)
This patient returns to my office for at risk foot care.  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  Patient is taking eliquiss.  This patient is unable to cut nails himself since the patient cannot reach his nails.These nails are painful walking and wearing shoes.  This patient presents for at risk foot care today.  General Appearance  Alert, conversant and in no acute stress.  Vascular  Dorsalis pedis and posterior tibial  pulses are palpable  bilaterally.  Capillary return is within normal limits  bilaterally. Temperature is within normal limits  bilaterally.  Neurologic  Senn-Weinstein monofilament wire test diminished  bilaterally. Muscle power within normal limits bilaterally.  Nails Thick disfigured discolored nails with subungual debris  from hallux to fifth toes bilaterally. No evidence of bacterial infection or drainage bilaterally.  Orthopedic  No limitations of motion  feet .  No crepitus or effusions noted.  No bony pathology or digital deformities noted.  HAV  B/L.  Hammer toes  B/L. No drainage or infection.  Skin  normotropic skin with no porokeratosis noted bilaterally.  No signs of infections or ulcers noted.     Onychomycosis  Pain in right toes  Pain in left toes  Consent was obtained for treatment procedures.   Mechanical debridement of nails 1-5  bilaterally performed with a nail nipper.  Filed with dremel without incident.    Return office visit    10 weeks                 Told patient to return for periodic foot care and evaluation due to potential at risk complications.   Cathy Crounse DPM  

## 2022-11-18 ENCOUNTER — Other Ambulatory Visit: Payer: Medicare Other

## 2022-11-24 ENCOUNTER — Ambulatory Visit (INDEPENDENT_AMBULATORY_CARE_PROVIDER_SITE_OTHER): Payer: Medicare Other | Admitting: Dermatology

## 2022-11-24 VITALS — BP 109/72

## 2022-11-24 DIAGNOSIS — W908XXA Exposure to other nonionizing radiation, initial encounter: Secondary | ICD-10-CM | POA: Diagnosis not present

## 2022-11-24 DIAGNOSIS — L720 Epidermal cyst: Secondary | ICD-10-CM

## 2022-11-24 DIAGNOSIS — L821 Other seborrheic keratosis: Secondary | ICD-10-CM

## 2022-11-24 DIAGNOSIS — Z79899 Other long term (current) drug therapy: Secondary | ICD-10-CM

## 2022-11-24 DIAGNOSIS — Z1283 Encounter for screening for malignant neoplasm of skin: Secondary | ICD-10-CM

## 2022-11-24 DIAGNOSIS — Z86007 Personal history of in-situ neoplasm of skin: Secondary | ICD-10-CM

## 2022-11-24 DIAGNOSIS — Z8589 Personal history of malignant neoplasm of other organs and systems: Secondary | ICD-10-CM

## 2022-11-24 DIAGNOSIS — L820 Inflamed seborrheic keratosis: Secondary | ICD-10-CM | POA: Diagnosis not present

## 2022-11-24 DIAGNOSIS — L570 Actinic keratosis: Secondary | ICD-10-CM | POA: Diagnosis not present

## 2022-11-24 DIAGNOSIS — L578 Other skin changes due to chronic exposure to nonionizing radiation: Secondary | ICD-10-CM

## 2022-11-24 DIAGNOSIS — L814 Other melanin hyperpigmentation: Secondary | ICD-10-CM

## 2022-11-24 DIAGNOSIS — Z872 Personal history of diseases of the skin and subcutaneous tissue: Secondary | ICD-10-CM

## 2022-11-24 DIAGNOSIS — D692 Other nonthrombocytopenic purpura: Secondary | ICD-10-CM

## 2022-11-24 DIAGNOSIS — D1801 Hemangioma of skin and subcutaneous tissue: Secondary | ICD-10-CM

## 2022-11-24 DIAGNOSIS — L719 Rosacea, unspecified: Secondary | ICD-10-CM

## 2022-11-24 DIAGNOSIS — X32XXXA Exposure to sunlight, initial encounter: Secondary | ICD-10-CM

## 2022-11-24 MED ORDER — DOXYCYCLINE HYCLATE 20 MG PO TABS: 20.0000 mg | ORAL_TABLET | Freq: Every day | ORAL | 2 refills | Status: DC

## 2022-11-24 NOTE — Progress Notes (Signed)
Follow-Up Visit   Subjective  [name] Ferguson is a 82 y.o. male who presents for the following: Skin Cancer Screening and Full Body Skin Exam, Hx of SCC IS, hx of Aks, scaly spots scalp  The patient presents for Total-Body Skin Exam (TBSE) for skin cancer screening and mole check. The patient has spots, moles and lesions to be evaluated, some may be new or changing and the patient has concerns that these could be cancer.    The following portions of the chart were reviewed this encounter and updated as appropriate: medications, allergies, medical history  Review of Systems:  No other skin or systemic complaints except as noted in HPI or Assessment and Plan.  Objective  Well appearing patient in no apparent distress; mood and affect are within normal limits.  A full examination was performed including scalp, head, eyes, ears, nose, lips, neck, chest, axillae, abdomen, back, buttocks, bilateral upper extremities, bilateral lower extremities, hands, feet, fingers, toes, fingernails, and toenails. All findings within normal limits unless otherwise noted below.   Relevant physical exam findings are noted in the Assessment and Plan.  face (5) Pink scaly macules  face x 2 (2) Stuck on waxy paps with erythema    Assessment & Plan   LENTIGINES, SEBORRHEIC KERATOSES, HEMANGIOMAS - Benign normal skin lesions - Benign-appearing - Call for any changes  MELANOCYTIC NEVI - Tan-brown and/or pink-flesh-colored symmetric macules and papules - Benign appearing on exam today - Observation - Call clinic for new or changing moles - Recommend daily use of broad spectrum spf 30+ sunscreen to sun-exposed areas.   ACTINIC DAMAGE - Chronic condition, secondary to cumulative UV/sun exposure - diffuse scaly erythematous macules with underlying dyspigmentation - Recommend daily broad spectrum sunscreen SPF 30+ to sun-exposed areas, reapply every 2 hours as needed.  - Staying in the shade or  wearing long sleeves, sun glasses (UVA+UVB protection) and wide brim hats (4-inch brim around the entire circumference of the hat) are also recommended for sun protection.  - Call for new or changing lesions.  SKIN CANCER SCREENING PERFORMED TODAY.  HISTORY OF SQUAMOUS CELL CARCINOMA IN SITU OF THE SKIN - No evidence of recurrence today - Recommend regular full body skin exams - Recommend daily broad spectrum sunscreen SPF 30+ to sun-exposed areas, reapply every 2 hours as needed.  - Call if any new or changing lesions are noted between office visits  - L ant scalp, L proximal dorsum forearm   Purpura - Chronic; persistent and recurrent.  Treatable, but not curable. - Violaceous macules and patches - Benign - Related to trauma, age, sun damage and/or use of blood thinners, chronic use of topical and/or oral steroids - Observe - Can use OTC arnica containing moisturizer such as Dermend Bruise Formula if desired - Call for worsening or other concerns  ROSACEA Exam Mid face erythema with telangiectasias, erythema of the sclera    Rosacea is a chronic progressive skin condition usually affecting the face of adults, causing redness and/or acne bumps. It is treatable but not curable. It sometimes affects the eyes (ocular rosacea) as well. It may respond to topical and/or systemic medication and can flare with stress, sun exposure, alcohol, exercise, topical steroids (including hydrocortisone/cortisone 10) and some foods.  Daily application of broad spectrum spf 30+ sunscreen to face is recommended to reduce flares.    Treatment Plan Restart Doxycycline 20mg  1 po qd with dinner  Doxycycline should be taken with food to prevent nausea. Do not lay down  for 30 minutes after taking. Be cautious with sun exposure and use good sun protection while on this medication. Pregnant women should not take this medication.      EPIDERMAL INCLUSION CYST Exam: Subcutaneous nodule at L upper arm  2.0cm  Benign-appearing. Exam most consistent with an epidermal inclusion cyst. Discussed that a cyst is a benign growth that can grow over time and sometimes get irritated or inflamed. Recommend observation if it is not bothersome. Discussed option of surgical excision to remove it if it is growing, symptomatic, or other changes noted. Please call for new or changing lesions so they can be evaluated.  Recommend excising, will schedule  AK (actinic keratosis) (5) face  Destruction of lesion - face Complexity: simple   Destruction method: cryotherapy   Informed consent: discussed and consent obtained   Timeout:  patient name, date of birth, surgical site, and procedure verified Lesion destroyed using liquid nitrogen: Yes   Region frozen until ice ball extended beyond lesion: Yes   Outcome: patient tolerated procedure well with no complications   Post-procedure details: wound care instructions given    Inflamed seborrheic keratosis (2) face x 2  Destruction of lesion - face x 2 Complexity: simple   Destruction method: cryotherapy   Informed consent: discussed and consent obtained   Timeout:  patient name, date of birth, surgical site, and procedure verified Lesion destroyed using liquid nitrogen: Yes   Region frozen until ice ball extended beyond lesion: Yes   Outcome: patient tolerated procedure well with no complications   Post-procedure details: wound care instructions given     Return for to be scheduled for surgery cyst L arm, recheck aks at surgery appt.  I, Ardis Rowan, RMA, am acting as scribe for Armida Sans, MD .   Documentation: I have reviewed the above documentation for accuracy and completeness, and I agree with the above.  Armida Sans, MD

## 2022-11-24 NOTE — Patient Instructions (Addendum)
Dermend Bruise Formula for arms  Restart Doxycycline 20mg  1 pill with dinner meal (or can take with evening snack)     Pre-Operative Instructions  You are scheduled for a surgical procedure at [facility]. We recommend you read the following instructions. If you have any questions or concerns, please call the office at [phone].  Shower and wash the entire body with soap and water the day of your surgery paying special attention to cleansing at and around the planned surgery site.  Avoid aspirin or aspirin containing products at least fourteen (14) days prior to your surgical procedure and for at least one week (7 Days) after your surgical procedure. If you take aspirin on a regular basis for heart disease or history of stroke or for any other reason, we may recommend you continue taking aspirin but please notify us if you take this on a regular basis. Aspirin can cause more bleeding to occur during surgery as well as prolonged bleeding and bruising after surgery.   Avoid other nonsteroidal pain medications at least one week prior to surgery and at least one week prior to your surgery. These include medications such as Ibuprofen (Motrin, Advil and Nuprin), Naprosyn, Voltaren, Relafen, etc. If medications are used for therapeutic reasons, please inform us as they can cause increased bleeding or prolonged bleeding during and bruising after surgical procedures.   Please advise [place] if you are taking any "blood thinner" medications such as Coumadin or Dipyridamole or Plavix or similar medications. These cause increased bleeding and prolonged bleeding during procedures and bruising after surgical procedures. We may have to consider discontinuing these medications briefly prior to and shortly after your surgery if safe to do so.   Please inform us of all medications you are currently taking. All medications that are taken regularly should be taken the day of surgery as you always do.  Nevertheless, we need to be informed of what medications you are taking prior to surgery to know whether they will affect the procedure or cause any complications.   Please inform us of any medication allergies. Also inform us of whether you have allergies to Latex or rubber products or whether you have had any adverse reaction to Lidocaine or Epinephrine.  Please inform us of any prosthetic or artificial body parts such as artificial heart valve, joint replacements, etc., or similar condition that might require preoperative antibiotics.   We recommend avoidance of alcohol at least two weeks prior to surgery and continued avoidance for at least two weeks after surgery.   We recommend discontinuation of tobacco smoking at least two weeks prior to surgery and continued abstinence for at least two weeks after surgery.  Do not plan strenuous exercise, strenuous work or strenuous lifting for approximately four weeks after your surgery.   We request if you are unable to make your scheduled surgical appointment, please call us at least a week in advance or as soon as you are aware of a problem so that we can cancel or reschedule the appointment.   You MAY TAKE TYLENOL (acetaminophen) for pain as it is not a blood thinner.   PLEASE PLAN TO BE IN TOWN FOR TWO WEEKS FOLLOWING SURGERY, THIS IS IMPORTANT SO YOU CAN BE CHECKED FOR DRESSING CHANGES, SUTURE REMOVAL AND TO MONITOR FOR POSSIBLE COMPLICATIONS.   Due to recent changes in healthcare laws, you may see results of your pathology and/or laboratory studies on MyChart before the doctors have had a chance to review them. We understand  that in some cases there may be results that are confusing or concerning to you. Please understand that not all results are received at the same time and often the doctors may need to interpret multiple results in order to provide you with the best plan of care or course of treatment. Therefore, we ask that you please give [place] 2  business days to thoroughly review all your results before contacting the office for clarification. Should we see a critical lab result, you will be contacted sooner.   If You Need Anything After Your Visit  If you have any questions or concerns for your doctor, please call our main line at [phone] and press option 4 to reach your doctor's medical assistant. If no one answers, please leave a voicemail as directed and we will return your call as soon as possible. Messages left after 4 pm will be answered the following business day.   You may also send [place] a message via MyChart. We typically respond to MyChart messages within 1-2 business days.  For prescription refills, please ask your pharmacy to contact our office. Our fax number is [phone].  If you have an urgent issue when the clinic is closed that cannot wait until the next business day, you can page your doctor at the number below.    Please note that while we do our best to be available for urgent issues outside of office hours, we are not available 24/7.   If you have an urgent issue and are unable to reach [place], you may choose to seek medical care at your doctor's office, retail clinic, urgent care center, or emergency room.  If you have a medical emergency, please immediately call 911 or go to the emergency department.  Pager Numbers  - Dr. Gwen Pounds: [phone]  - Dr. Neale Burly: [phone]  - Dr. Roseanne Reno: [phone]  In the event of inclement weather, please call our main line at [phone] for an update on the status of any delays or closures.  Dermatology Medication Tips: Please keep the boxes that topical medications come in in order to help keep track of the instructions about where and how to use these. Pharmacies typically print the medication instructions only on the boxes and not directly on the medication tubes.   If your medication is too expensive, please contact our office at [phone] option 4 or  send [place] a message through MyChart.   We are unable to tell what your co-pay for medications will be in advance as this is different depending on your insurance coverage. However, we may be able to find a substitute medication at lower cost or fill out paperwork to get insurance to cover a needed medication.   If a prior authorization is required to get your medication covered by your insurance company, please allow [place] 1-2 business days to complete this process.  Drug prices often vary depending on where the prescription is filled and some pharmacies may offer cheaper prices.  The website www.goodrx.com contains coupons for medications through different pharmacies. The prices here do not account for what the cost may be with help from insurance (it may be cheaper with your insurance), but the website can give you the price if you did not use any insurance.  - You can print the associated coupon and take it with your prescription to the pharmacy.  - You may also stop by our office during regular business hours and pick up a GoodRx coupon card.  - If you need  your prescription sent electronically to a different pharmacy, notify our office through [facility] or by phone at [phone] option 4.     Si Usted Necesita Algo Despus de Su Visita  Tambin puede enviarnos un mensaje a travs de Pharmacist, community. Por lo general respondemos a los mensajes de MyChart en el transcurso de 1 a 2 das hbiles.  Para renovar recetas, por favor pida a su farmacia que se ponga en contacto con nuestra oficina. Harland Dingwall de fax es [place] [phone].  Si tiene un asunto urgente cuando la clnica est cerrada y que no puede esperar hasta el siguiente da hbil, puede llamar/localizar a su doctor(a) al nmero que aparece a continuacin.   Por favor, tenga en cuenta que aunque hacemos todo lo posible para estar disponibles para asuntos urgentes fuera del horario de [place], no estamos disponibles las 24 horas del  da, los 7 das de la [place].   Si tiene un problema urgente y no puede comunicarse con nosotros, puede optar por buscar atencin mdica  en el consultorio de su doctor(a), en una clnica privada, en un centro de atencin urgente o en una sala de emergencias.  Si tiene Engineering geologist, por favor llame inmediatamente al 911 o vaya a la sala de emergencias.  Nmeros de bper  - Dr. Nehemiah Massed: [phone]  - Dra. Moye: [phone]  - Dra. Nicole Kindred: [phone]  En caso de inclemencias del [place], por favor llame a Johnsie Kindred principal al [phone] para una actualizacin sobre el [place] de cualquier retraso o cierre.  Consejos para la medicacin en dermatologa: Por favor, guarde las cajas en las que vienen los medicamentos de uso tpico para ayudarle a seguir las instrucciones sobre dnde y cmo usarlos. Las farmacias generalmente imprimen las instrucciones del medicamento slo en las cajas y no directamente en los tubos del [place].   Si su medicamento es muy caro, por favor, pngase en contacto con Zigmund Daniel llamando al [phone] y presione la opcin 4 o envenos un mensaje a travs de Pharmacist, community.   No podemos decirle cul ser su copago por los medicamentos por adelantado ya que esto es diferente dependiendo de la cobertura de su seguro. Sin embargo, es posible que podamos encontrar un medicamento sustituto a Electrical engineer un formulario para que el seguro cubra el medicamento que se considera necesario.   Si se requiere una autorizacin previa para que su compaa de seguros [place] su medicamento, por favor permtanos de 1 a 2 das hbiles para completar este proceso.  Los precios de los medicamentos varan con frecuencia dependiendo del Environmental consultant de dnde se surte la receta y alguna farmacias pueden ofrecer precios ms baratos.  El sitio web www.goodrx.com tiene cupones para medicamentos de Airline pilot. Los precios aqu no tienen en cuenta lo que podra  costar con la ayuda del seguro (puede ser ms barato con su seguro), pero el sitio web puede darle el precio si no utiliz Research scientist (physical sciences).  - Puede imprimir el cupn correspondiente y llevarlo con su receta a la farmacia.  - Tambin puede pasar por nuestra oficina durante el horario de atencin regular y Charity fundraiser una tarjeta de cupones de GoodRx.  - Si necesita que su receta se enve electrnicamente a una farmacia diferente, informe a nuestra oficina a travs de MyChart de [place] o por telfono llamando al [phone] y presione la opcin 4.

## 2022-12-08 ENCOUNTER — Encounter: Payer: Self-pay | Admitting: Dermatology

## 2022-12-15 ENCOUNTER — Other Ambulatory Visit: Payer: Self-pay | Admitting: *Deleted

## 2022-12-15 MED ORDER — TAMSULOSIN HCL 0.4 MG PO CAPS: ORAL_CAPSULE | ORAL | 1 refills | Status: DC

## 2022-12-16 ENCOUNTER — Ambulatory Visit (INDEPENDENT_AMBULATORY_CARE_PROVIDER_SITE_OTHER): Payer: Medicare Other | Admitting: Podiatry

## 2022-12-16 DIAGNOSIS — M2041 Other hammer toe(s) (acquired), right foot: Secondary | ICD-10-CM

## 2022-12-16 DIAGNOSIS — E1142 Type 2 diabetes mellitus with diabetic polyneuropathy: Secondary | ICD-10-CM

## 2022-12-16 DIAGNOSIS — M2042 Other hammer toe(s) (acquired), left foot: Secondary | ICD-10-CM

## 2022-12-16 NOTE — Progress Notes (Signed)
Patient presents today to be measured diabetic shoes and insoles.  Patient was measured for 1 pair of diabetic shoes and 3 pairs of foam casted diabetic insoles.   Wt 215lb Shoe size 10W Shoe type 1. v835m                    2.  P7249m                     3. x556m  Treating physician : Dr Earlene Plater ([facility])    Re-appointment for regularly scheduled diabetic foot care visits or if they should experience any trouble with the shoes or insoles.

## 2022-12-20 ENCOUNTER — Ambulatory Visit (INDEPENDENT_AMBULATORY_CARE_PROVIDER_SITE_OTHER): Payer: Medicare Other | Admitting: Dermatology

## 2022-12-20 DIAGNOSIS — D492 Neoplasm of unspecified behavior of bone, soft tissue, and skin: Secondary | ICD-10-CM

## 2022-12-20 DIAGNOSIS — L720 Epidermal cyst: Secondary | ICD-10-CM | POA: Diagnosis not present

## 2022-12-20 MED ORDER — MUPIROCIN 2 % EX OINT
1.0000 | TOPICAL_OINTMENT | Freq: Every day | CUTANEOUS | 1 refills | Status: AC
Start: 2022-12-20 — End: ?

## 2022-12-20 NOTE — Patient Instructions (Addendum)

## 2022-12-20 NOTE — Progress Notes (Signed)
   Follow-Up Visit   Subjective  [name] Ferguson is a 82 y.o. male who presents for the following: Excision of Cyst vs other of left upper arm  The following portions of the chart were reviewed this encounter and updated as appropriate: medications, allergies, medical history  Review of Systems:  No other skin or systemic complaints except as noted in HPI or Assessment and Plan.  Objective  Well appearing patient in no apparent distress; mood and affect are within normal limits.  A focused examination was performed of the following areas: Left arm  Relevant physical exam findings are noted in the Assessment and Plan.   Left Upper Arm Cystic pap   Assessment & Plan   Neoplasm of skin - suspected symptomatic growing cyst Left Upper Arm  mupirocin ointment (BACTROBAN) 2 % Apply 1 Application topically daily. Qd to excision site  Skin excision  Lesion length (cm):  2.5 Lesion width (cm):  2.5 Margin per side (cm):  0 Total excision diameter (cm):  2.5 Informed consent: discussed and consent obtained   Timeout: patient name, date of birth, surgical site, and procedure verified   Procedure prep:  Patient was prepped and draped in usual sterile fashion Prep type:  Isopropyl alcohol and povidone-iodine Anesthesia: the lesion was anesthetized in a standard fashion   Anesthetic:  1% lidocaine w/ epinephrine 1-100,000 buffered w/ 8.4% NaHCO3 Instrument used: #15 blade   Hemostasis achieved with: pressure   Hemostasis achieved with comment:  Electrocautery Outcome: patient tolerated procedure well with no complications   Post-procedure details: sterile dressing applied and wound care instructions given   Dressing type: bandage and pressure dressing (Mupirocin)    Skin repair Complexity:  Complex Final length (cm):  3 Reason for type of repair: reduce tension to allow closure, reduce the risk of dehiscence, infection, and necrosis, reduce subcutaneous dead space and avoid a  hematoma, allow closure of the large defect, preserve normal anatomy, preserve normal anatomical and functional relationships and enhance both functionality and cosmetic results   Undermining: area extensively undermined   Undermining comment:  Undermining Defect 2.5 cm Subcutaneous layers (deep stitches):  Suture size:  2-0 Suture type: Vicryl (polyglactin 910)   Subcutaneous suture technique: Inverted Dermal. Fine/surface layer approximation (top stitches):  Suture size:  2-0 Stitches: simple running   Suture removal (days):  7 Hemostasis achieved with: pressure Outcome: patient tolerated procedure well with no complications   Post-procedure details: sterile dressing applied and wound care instructions given   Dressing type: bandage, pressure dressing and bacitracin (Mupirocin)    Specimen 1 - Surgical pathology Differential Diagnosis: D48.5 Cyst vs other  Check Margins: No Cystic pap  Cyst vs other, excised today Start Mupirocin oint qd to excision site     Return in about 1 week (around 12/27/2022) for suture removal.  I, Joanie Coddington, CMA, am acting as scribe for Armida Sans, MD .  Documentation: I have reviewed the above documentation for accuracy and completeness, and I agree with the above.  Armida Sans, MD

## 2022-12-21 ENCOUNTER — Telehealth: Payer: Self-pay

## 2022-12-21 NOTE — Telephone Encounter (Signed)
Spoke with patient regarding surgery. He is doing fine/hd  

## 2022-12-23 ENCOUNTER — Encounter: Payer: Self-pay | Admitting: Dermatology

## 2022-12-27 ENCOUNTER — Ambulatory Visit (INDEPENDENT_AMBULATORY_CARE_PROVIDER_SITE_OTHER): Payer: Medicare Other | Admitting: Dermatology

## 2022-12-27 ENCOUNTER — Encounter: Payer: Self-pay | Admitting: Dermatology

## 2022-12-27 VITALS — BP 129/69

## 2022-12-27 DIAGNOSIS — L729 Follicular cyst of the skin and subcutaneous tissue, unspecified: Secondary | ICD-10-CM

## 2022-12-27 DIAGNOSIS — L570 Actinic keratosis: Secondary | ICD-10-CM | POA: Diagnosis not present

## 2022-12-27 DIAGNOSIS — L578 Other skin changes due to chronic exposure to nonionizing radiation: Secondary | ICD-10-CM

## 2022-12-27 DIAGNOSIS — L820 Inflamed seborrheic keratosis: Secondary | ICD-10-CM

## 2022-12-27 DIAGNOSIS — W908XXA Exposure to other nonionizing radiation, initial encounter: Secondary | ICD-10-CM

## 2022-12-27 NOTE — Progress Notes (Signed)
   Follow-Up Visit   Subjective  [name] Ferguson is a 82 y.o. male who presents for the following: Post op Cyst bx proven L upper arm, pt presents for suture removal  The following portions of the chart were reviewed this encounter and updated as appropriate: medications, allergies, medical history  Review of Systems:  No other skin or systemic complaints except as noted in HPI or Assessment and Plan.  Objective  Well appearing patient in no apparent distress; mood and affect are within normal limits.  A focused examination was performed of the following areas: Face, scalp, arms, hands  Relevant exam findings are noted in the Assessment and Plan.  scalp, face x 9 (9) Pink scaly macules  arms, hands x 3 (3) Stuck on waxy paps with erythema    Assessment & Plan   CYST, BX PROVEN  Exam: L upper arm healing excision site  Treatment Plan: Encounter for Removal of Sutures - Incision site at the L upper arm is clean, dry and intact - Wound cleansed, sutures removed, wound cleansed and steri strips applied.  - Discussed pathology results showing Cyst  - Patient advised to keep steri-strips dry until they fall off. - Scars remodel for a full year. - Once steri-strips fall off, patient can apply over-the-counter silicone scar cream each night to help with scar remodeling if desired. - Patient advised to call with any concerns or if they notice any new or changing lesions.    AK (actinic keratosis) (9) scalp, face x 9  Destruction of lesion - scalp, face x 9 Complexity: simple   Destruction method: cryotherapy   Informed consent: discussed and consent obtained   Timeout:  patient name, date of birth, surgical site, and procedure verified Lesion destroyed using liquid nitrogen: Yes   Region frozen until ice ball extended beyond lesion: Yes   Outcome: patient tolerated procedure well with no complications   Post-procedure details: wound care instructions given    Inflamed  seborrheic keratosis (3) arms, hands x 3  Symptomatic, irritating, patient would like treated.   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  - Recommend staying in the shade or wearing long sleeves, sun glasses (UVA+UVB protection) and wide brim hats (4-inch brim around the entire circumference of the hat). - Call for new or changing lesions.   Return in about 8 months (around 08/29/2023) for AK f/u.  I, Ardis Rowan, RMA, am acting as scribe for Armida Sans, MD .  Documentation: I have reviewed the above documentation for accuracy and completeness, and I agree with the above.  Armida Sans, MD

## 2022-12-27 NOTE — Patient Instructions (Addendum)
Cryotherapy Aftercare  Wash gently with soap and water everyday.   Apply Vaseline and Band-Aid daily until healed.     Due to recent changes in healthcare laws, you may see results of your pathology and/or laboratory studies on MyChart before the doctors have had a chance to review them. We understand that in some cases there may be results that are confusing or concerning to you. Please understand that not all results are received at the same time and often the doctors may need to interpret multiple results in order to provide you with the best plan of care or course of treatment. Therefore, we ask that you please give us 2 business days to thoroughly review all your results before contacting the office for clarification. Should we see a critical lab result, you will be contacted sooner.   If You Need Anything After Your Visit  If you have any questions or concerns for your doctor, please call our main line at [phone] and press option 4 to reach your doctor's medical assistant. If no one answers, please leave a voicemail as directed and we will return your call as soon as possible. Messages left after 4 pm will be answered the following business day.   You may also send us a message via MyChart. We typically respond to MyChart messages within 1-2 business days.  For prescription refills, please ask your pharmacy to contact our office. Our fax number is [phone].  If you have an urgent issue when the clinic is closed that cannot wait until the next business day, you can page your doctor at the number below.    Please note that while we do our best to be available for urgent issues outside of office hours, we are not available 24/7.   If you have an urgent issue and are unable to reach us, you may choose to seek medical care at your doctor's office, retail clinic, urgent care center, or emergency room.  If you have a medical emergency, please immediately call 911 or go to the  emergency department.  Pager Numbers  - Dr. Kowalski: [phone]  - Dr. Moye: [phone]  - Dr. Stewart: [phone]  In the event of inclement weather, please call our main line at [phone] for an update on the status of any delays or closures.  Dermatology Medication Tips: Please keep the boxes that topical medications come in in order to help keep track of the instructions about where and how to use these. Pharmacies typically print the medication instructions only on the boxes and not directly on the medication tubes.   If your medication is too expensive, please contact our office at [phone] option 4 or send us a message through MyChart.   We are unable to tell what your co-pay for medications will be in advance as this is different depending on your insurance coverage. However, we may be able to find a substitute medication at lower cost or fill out paperwork to get insurance to cover a needed medication.   If a prior authorization is required to get your medication covered by your insurance company, please allow us 1-2 business days to complete this process.  Drug prices often vary depending on where the prescription is filled and some pharmacies may offer cheaper prices.  The website www.goodrx.com contains coupons for medications through different pharmacies. The prices here do not account for what the cost may be with help from insurance (it may be cheaper with your insurance), but the website can   give you the price if you did not use any insurance.  - You can print the associated coupon and take it with your prescription to the pharmacy.  - You may also stop by our office during regular business hours and pick up a GoodRx coupon card.  - If you need your prescription sent electronically to a different pharmacy, notify our office through [place] MyChart or by phone at [phone] option 4.     Si Usted Necesita Algo Despus de Su Visita  Tambin puede  enviarnos un mensaje a travs de MyChart. Por lo general respondemos a los mensajes de MyChart en el transcurso de 1 a 2 das hbiles.  Para renovar recetas, por favor pida a su farmacia que se ponga en contacto con nuestra oficina. Nuestro nmero de fax es el [phone].  Si tiene un asunto urgente cuando la clnica est cerrada y que no puede esperar hasta el siguiente da hbil, puede llamar/localizar a su doctor(a) al nmero que aparece a continuacin.   Por favor, tenga en cuenta que aunque hacemos todo lo posible para estar disponibles para asuntos urgentes fuera del horario de oficina, no estamos disponibles las 24 horas del da, los 7 das de la semana.   Si tiene un problema urgente y no puede comunicarse con nosotros, puede optar por buscar atencin mdica  en el consultorio de su doctor(a), en una clnica privada, en un centro de atencin urgente o en una sala de emergencias.  Si tiene una emergencia mdica, por favor llame inmediatamente al 911 o vaya a la sala de emergencias.  Nmeros de bper  - Dr. Kowalski: [phone]  - Dra. Moye: [phone]  - Dra. Stewart: [phone]  En caso de inclemencias del tiempo, por favor llame a nuestra lnea principal al [phone] para una actualizacin sobre el estado de cualquier retraso o cierre.  Consejos para la medicacin en dermatologa: Por favor, guarde las cajas en las que vienen los medicamentos de uso tpico para ayudarle a seguir las instrucciones sobre dnde y cmo usarlos. Las farmacias generalmente imprimen las instrucciones del medicamento slo en las cajas y no directamente en los tubos del medicamento.   Si su medicamento es muy caro, por favor, pngase en contacto con nuestra oficina llamando al [phone] y presione la opcin 4 o envenos un mensaje a travs de MyChart.   No podemos decirle cul ser su copago por los medicamentos por adelantado ya que esto es diferente dependiendo de la cobertura de su seguro.  Sin embargo, es posible que podamos encontrar un medicamento sustituto a menor costo o llenar un formulario para que el seguro cubra el medicamento que se considera necesario.   Si se requiere una autorizacin previa para que su compaa de seguros cubra su medicamento, por favor permtanos de 1 a 2 das hbiles para completar este proceso.  Los precios de los medicamentos varan con frecuencia dependiendo del lugar de dnde se surte la receta y alguna farmacias pueden ofrecer precios ms baratos.  El sitio web www.goodrx.com tiene cupones para medicamentos de diferentes farmacias. Los precios aqu no tienen en cuenta lo que podra costar con la ayuda del seguro (puede ser ms barato con su seguro), pero el sitio web puede darle el precio si no utiliz ningn seguro.  - Puede imprimir el cupn correspondiente y llevarlo con su receta a la farmacia.  - Tambin puede pasar por nuestra oficina durante el horario de atencin regular y recoger una tarjeta de cupones de GoodRx.  -   Si necesita que su receta se enve electrnicamente a una farmacia diferente, informe a nuestra oficina a travs de MyChart de [place] o por telfono llamando al [phone] y presione la opcin 4.  

## 2023-02-02 ENCOUNTER — Ambulatory Visit: Payer: Medicare Other | Admitting: Podiatry

## 2023-02-02 NOTE — Progress Notes (Signed)
02/03/23 11:32 AM   [name] Ferguson 08/21/1940 [id number]  Referring provider:  Lauro Regulus, MD [address] [facility] [place],  [place] [address]  Urological history  1. BPH with LU TS -PSA (2022)  2.6  -tamsulosin 0.4 mg daily   2. Prostate nodule -contrast CT 2017 - Enlarged prostate with small internal low attenuating structure that is not well visualized by CT; differential considerations include, but are not limited to a BPH nodule or sequela of prostatitis with possible tiny non drainable fluid collection  - stable on exam    3. Elevated PSA -7.34 ng/mL on 06/10/2016                        Chief Complaint  Patient presents with   Follow-up    HPI: [name] Ferguson is a 82 y.o.male who presents today for a 1 year follow-up with IPSS and exam with his wife, [name] Ferguson.    I PSS 12/1  He has no complaints.  He is sitting to urinate.  Patient denies any modifying or aggravating factors.  Patient denies any recent UTI's, gross hematuria, dysuria or suprapubic/flank pain.  Patient denies any fevers, chills, nausea or vomiting.   He takes tamsulosin 0.4 mg daily.    IPSS     Row Name 02/03/23 1100         International Prostate Symptom Score   How often have you had the sensation of not emptying your bladder? Less than half the time     How often have you had to urinate less than every two hours? About half the time     How often have you found it difficult to postpone urination? About half the time     How often have you had a weak urinary stream? Less than half the time     How often have you had to strain to start urination? Less than 1 in 5 times     How many times did you typically get up at night to urinate? 1 Time     Total IPSS Score 12       Quality of Life due to urinary symptoms   If you were to spend the rest of your life with your urinary condition just the way it is now how would you feel about that? 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 If you have any questions, ask your nurse or doctor.          STOP taking these medications    Glucosamine HCl 1500 MG Tabs Stopped by: Trianna Lupien   glucosamine-chondroitin 500-400 MG tablet Stopped by: Nalaysia Manganiello       TAKE these medications    acyclovir 400 MG tablet Commonly known as: ZOVIRAX TK 1 T PO TID FOR 5 DAYS FOR EACH FEVER BLISTER OUTBREAK   amiodarone 200 MG tablet Commonly known as: PACERONE Take 1.5 tablets by mouth  daily.   ascorbic acid 500 MG tablet Commonly known as: VITAMIN C Take 500 mg by mouth daily. What changed: Another medication with the same name was removed. Continue taking this medication, and follow the directions you see here. Changed by: Michiel Cowboy   Aspirin-Calcium Carbonate 81-777 MG Tabs Take by mouth.   Besifloxacin HCl 0.6 % Susp Apply to eye.   brimonidine 0.15 % ophthalmic solution Commonly known as: ALPHAGAN Place 1 drop into the left eye 2 (two) times daily.   Brinzolamide-Brimonidine 1-0.2 % Susp Apply 1 drop to eye 2 (two) times daily.   dorzolamide 2 % ophthalmic solution Commonly known as: TRUSOPT Administer 2 drops to both eyes Two (2) times a day.   doxycycline 20 MG tablet Commonly known as: PERIOSTAT Take 1 tablet (20 mg total) by mouth daily. Take 1 po qd with dinner meal   Eliquis 2.5 MG Tabs tablet Generic drug: apixaban Take 2.5 mg by mouth 2 (two) times daily.   ezetimibe 10 MG tablet Commonly known as: ZETIA Take 1 tablet by mouth daily.   Fluad 0.5 ML Susy Generic drug: Influenza Vac A&B Surf Ant Adj TO BE ADMINISTERED BY PHARMACIST FOR IMMUNIZATION   fluticasone 50 MCG/ACT nasal spray Commonly known as: FLONASE U 1 SPR NASALLY BID   glucagon 1 MG injection Use as directed for emergent low blood sugar,   glucose blood test strip TEST 3 TIMES DAILY   HYDROcodone-acetaminophen 5-325 MG tablet Commonly known as: NORCO/VICODIN Take 1 tablet by mouth every 8 (eight) hours as needed.   insulin detemir 100 UNIT/ML FlexPen Commonly known as: LEVEMIR Inject 35-40 Units into the skin at bedtime. Says 50 units is prescribed but will usually take 35-40 at night   insulin glargine 100 UNIT/ML Solostar Pen Commonly known as: LANTUS Inject into the skin.   Invokana 100 MG Tabs tablet Generic drug: canagliflozin What changed: Another medication with the same name was removed. Continue taking this medication, and follow the directions  you see here. Changed by: Michiel Cowboy   isosorbide mononitrate 30 MG 24 hr tablet Commonly known as: IMDUR Take 30 mg by mouth daily.   Klor-Con M20 20 MEQ tablet Generic drug: potassium chloride SA Take 20 mEq by mouth 2 (two) times daily.   latanoprost 0.005 %  ophthalmic solution Commonly known as: XALATAN   levothyroxine 50 MCG tablet Commonly known as: SYNTHROID PLEASE SEE ATTACHED FOR DETAILED DIRECTIONS What changed: Another medication with the same name was removed. Continue taking this medication, and follow the directions you see here. Changed by: Carollee Herter Aquila Menzie   lisinopril 10 MG tablet Commonly known as: ZESTRIL Take 10 mg by mouth every morning.   metFORMIN 500 MG 24 hr tablet Commonly known as: GLUCOPHAGE-XR Take 1,000 mg by mouth 2 (two) times daily.   metoprolol succinate 25 MG 24 hr tablet Commonly known as: TOPROL-XL Take 1 tablet by mouth daily.   MULTIVITAMIN ADULT PO Take by mouth.   mupirocin ointment 2 % Commonly known as: BACTROBAN Apply 1 Application topically daily. Qd to excision site   nitroGLYCERIN 0.4 MG SL tablet Commonly known as: NITROSTAT Place under the tongue.   OMEGA-3 FISH OIL PO Take by mouth.   OneTouch Delica Lancets 33G Misc USE 1 LANCET 3 TIMES A DAY   OneTouch Verio IQ System w/Device Kit U UTD TO CHECK BS QID   PEN NEEDLES 31GX5/16" 31G X 8 MM Misc as directed   potassium chloride 10 MEQ tablet Commonly known as: KLOR-CON Take 10 mEq by mouth daily.   pravastatin 40 MG tablet Commonly known as: PRAVACHOL Take 1 tablet by mouth every evening.   prednisoLONE acetate 1 % ophthalmic suspension Commonly known as: PRED FORTE PLACE 1 DROP INTO THE RIGHT EYE 6 (SIX) TIMES DAILY   Repatha SureClick 140 MG/ML Soaj Generic drug: Evolocumab Inject 1 mL into the skin every 14 (fourteen) days.   tamsulosin 0.4 MG Caps capsule Commonly known as: FLOMAX TAKE 1 CAPSULE(0.4 MG) BY MOUTH DAILY   torsemide 20 MG  tablet Commonly known as: DEMADEX Take by mouth.   Tradjenta 5 MG Tabs tablet Generic drug: linagliptin   triamcinolone cream 0.1 % Commonly known as: KENALOG Apply topically.   Trulicity 0.75 MG/0.5ML Sopn Generic drug: Dulaglutide Inject 0.75 mg into the skin once a week.   UNABLE TO FIND Take by mouth.   Vascepa 0.5 g Caps Generic drug: Icosapent Ethyl Take 4 capsules by mouth 2 (two) times daily.   vitamin B-12 500 MCG tablet Commonly known as: CYANOCOBALAMIN Take 500 mcg by mouth daily.        Allergies:  Allergies  Allergen Reactions   Statins Other (See Comments)    Suspected cause of muscle pain   Metformin Diarrhea    Family History: Family History  Problem Relation Age of Onset   Diabetes Mother    Alzheimer's disease Mother    Cancer Sister    Heart disease Other        Family members in general   Diabetes Brother    Prostate cancer Neg Hx    Bladder Cancer Neg Hx    Kidney disease Neg Hx     Social History:  reports that he quit smoking about 41 years ago. His smoking use included cigarettes. He has never used smokeless tobacco. He reports current alcohol use. He reports that he does not use drugs.   Physical Exam: BP (!) 98/58   Pulse 67   Wt 214 lb (97.1 kg)   BMI 33.52 kg/m   Constitutional:  Well nourished. Alert and oriented, No acute distress. HEENT: [place] AT, moist mucus membranes.  Trachea midline Cardiovascular: No clubbing, cyanosis, or edema. Respiratory: Normal respiratory effort, no increased work of breathing. Neurologic: Grossly intact, no focal deficits, moving all 4 extremities. Psychiatric:  Normal mood and affect.   Laboratory Data: Comprehensive Metabolic Panel (CMP) Order: [id number] Component Ref Range & Units 3 mo ago Glucose 70 - 110 mg/dL 409 High  Sodium 811 - 145 mmol/L 141 Potassium 3.6 - 5.1 mmol/L 4.0 Chloride 97 - 109 mmol/L 103 Carbon Dioxide (CO2) 22.0 - 32.0 mmol/L 30.0 Urea Nitrogen  (BUN) 7 - 25 mg/dL 30 High  Creatinine 0.7 - 1.3 mg/dL 1.9 High  Glomerular Filtration Rate (eGFR) >60 mL/min/1.73sq m 35 Low  Comment: CKD-EPI (2021) does not include patient's race in the calculation of eGFR.  Monitoring changes of plasma creatinine and eGFR over time is useful for monitoring kidney function.  Interpretive Ranges for eGFR (CKD-EPI 2021):  eGFR:       >60 mL/min/1.73 sq. m - Normal eGFR:       30-59 mL/min/1.73 sq. m - Moderately Decreased eGFR:       15-29 mL/min/1.73 sq. m  - Severely Decreased eGFR:       < 15 mL/min/1.73 sq. m  - Kidney Failure   Note: These eGFR calculations do not apply in acute situations when eGFR is changing rapidly or patients on dialysis. Calcium 8.7 - 10.3 mg/dL 9.0 AST 8 - 39 U/L 42 High  ALT 6 - 57 U/L 53 Alk Phos (alkaline Phosphatase) 34 - 104 U/L 94 Albumin 3.5 - 4.8 g/dL 4.3 Bilirubin, Total 0.3 - 1.2 mg/dL 0.9 Protein, Total 6.1 - 7.9 g/dL 6.7 A/G Ratio 1.0 - 5.0 gm/dL 1.8 Resulting Agency [facility]  Specimen Collected: 10/18/22 08:08 Performed by: [facility] Last Resulted: 10/18/22 10:30 Received From: [facility]  Result Received: 11/03/22 13:01   Hemoglobin A1C Order: [id number] Component Ref Range & Units 3 mo ago Hemoglobin A1C 4.2 - 5.6 % 7.4 High  Average Blood Glucose (Calc) mg/dL 213 Resulting Agency [facility] Narrative Performed by [facility] Normal Range:    4.2 - 5.6% Increased Risk:  5.7 - 6.4% Diabetes:        >= 6.5% Glycemic Control for adults with diabetes:  <7%    Specimen Collected: 10/18/22 08:08 Performed by: [facility] Last Resulted: 10/18/22 09:31 Received From: [facility]  Result Received: 11/03/22 13:01  Pertinent Imaging N/A  Assessment & Plan:    1. BPH with LUTS -symptoms - weak stream  -continue conservative management, avoiding  bladder irritants and timed voiding's -Continue tamsulosin 0.4 mg daily     Return in about 1 year (around 02/03/2024) for I PSS .  Cloretta Ned   [facility] [address] [place], [place] [address] [phone]

## 2023-02-03 ENCOUNTER — Ambulatory Visit (INDEPENDENT_AMBULATORY_CARE_PROVIDER_SITE_OTHER): Payer: Medicare Other | Admitting: Urology

## 2023-02-03 ENCOUNTER — Encounter: Payer: Self-pay | Admitting: Urology

## 2023-02-03 VITALS — BP 98/58 | HR 67 | Wt 214.0 lb

## 2023-02-03 DIAGNOSIS — N401 Enlarged prostate with lower urinary tract symptoms: Secondary | ICD-10-CM | POA: Diagnosis not present

## 2023-02-03 DIAGNOSIS — R3912 Poor urinary stream: Secondary | ICD-10-CM | POA: Diagnosis not present

## 2023-02-03 MED ORDER — TAMSULOSIN HCL 0.4 MG PO CAPS
ORAL_CAPSULE | ORAL | 3 refills | Status: DC
Start: 2023-02-03 — End: 2024-03-19

## 2023-02-09 ENCOUNTER — Ambulatory Visit (INDEPENDENT_AMBULATORY_CARE_PROVIDER_SITE_OTHER): Payer: Medicare Other | Admitting: Podiatry

## 2023-02-09 VITALS — BP 130/71

## 2023-02-09 DIAGNOSIS — M79675 Pain in left toe(s): Secondary | ICD-10-CM | POA: Diagnosis not present

## 2023-02-09 DIAGNOSIS — E1142 Type 2 diabetes mellitus with diabetic polyneuropathy: Secondary | ICD-10-CM | POA: Diagnosis not present

## 2023-02-09 DIAGNOSIS — M79674 Pain in right toe(s): Secondary | ICD-10-CM | POA: Diagnosis not present

## 2023-02-09 DIAGNOSIS — B351 Tinea unguium: Secondary | ICD-10-CM | POA: Diagnosis not present

## 2023-02-16 ENCOUNTER — Encounter: Payer: Self-pay | Admitting: Podiatry

## 2023-02-16 NOTE — Progress Notes (Signed)
  Subjective:  Patient ID: [name] Ferguson, male    DOB: Nov 12, 1940,  MRN: [id number]  [name] Ferguson presents to clinic today for: at risk foot care with history of diabetic neuropathy and painful thick toenails that are difficult to trim. Pain interferes with ambulation. Aggravating factors include wearing enclosed shoe gear. Pain is relieved with periodic professional debridement.  Chief Complaint  Patient presents with   Nail Problem    DFC,Referring Provider Lauro Regulus, MD,lov:04/24,A1C:7.4       PCP is Lauro Regulus, MD.  Allergies  Allergen Reactions   Statins Other (See Comments)    Suspected cause of muscle pain   Metformin Diarrhea    Review of Systems: Negative except as noted in the HPI.  Objective: No changes noted in today's physical examination. Vitals:   02/09/23 1504  BP: 130/71    [name] Ferguson is a pleasant 82 y.o. male in NAD. AAO x 3.  Vascular Examination: Capillary refill time <3 seconds b/l LE. Palpable pedal pulses b/l LE. Digital hair present b/l. No pedal edema b/l. Skin temperature gradient WNL b/l. No varicosities b/l. Marland Kitchen  Dermatological Examination: Pedal skin with normal turgor, texture and tone b/l. No open wounds. No interdigital macerations b/l. Toenails 1-5 b/l thickened, discolored, dystrophic with subungual debris. There is pain on palpation to dorsal aspect of nailplates. .  Neurological Examination: Protective sensation intact with 10 gram monofilament b/l LE. Vibratory sensation intact b/l LE. Pt has subjective symptoms of neuropathy.  Musculoskeletal Examination: Normal muscle strength 5/5 to all lower extremity muscle groups bilaterally. HAV with bunion bilaterally and hammertoes 2-5 b/l.Marland Kitchen No pain, crepitus or joint limitation noted with ROM b/l LE.  Patient ambulates independently without assistive aids.  Assessment/Plan: 1. Pain due to onychomycosis of toenails of both feet   2. Diabetic polyneuropathy  associated with type 2 diabetes mellitus (HCC)    -Consent given for treatment as described below: -Examined patient. -Patient to continue soft, supportive shoe gear daily. -Toenails 1-5 bilaterally were debrided in length and girth with sterile nail nippers and dremel. Pinpoint bleeding of L 3rd toe addressed with Lumicain Hemostatic Solution, cleansed with alcohol. triple antibiotic ointment applied. Patient/careigver instructed to apply Neosporin Cream once daily for 7 days. -Patient/POA to call should there be question/concern in the interim.   Return in about 10 weeks (around 04/20/2023).  Freddie Breech, DPM

## 2023-03-27 ENCOUNTER — Encounter: Payer: Self-pay | Admitting: Dermatology

## 2023-03-27 ENCOUNTER — Ambulatory Visit (INDEPENDENT_AMBULATORY_CARE_PROVIDER_SITE_OTHER): Payer: Medicare Other | Admitting: Dermatology

## 2023-03-27 VITALS — BP 117/65 | HR 69

## 2023-03-27 DIAGNOSIS — C4442 Squamous cell carcinoma of skin of scalp and neck: Secondary | ICD-10-CM

## 2023-03-27 DIAGNOSIS — L570 Actinic keratosis: Secondary | ICD-10-CM | POA: Diagnosis not present

## 2023-03-27 DIAGNOSIS — D692 Other nonthrombocytopenic purpura: Secondary | ICD-10-CM

## 2023-03-27 DIAGNOSIS — L821 Other seborrheic keratosis: Secondary | ICD-10-CM

## 2023-03-27 DIAGNOSIS — W908XXA Exposure to other nonionizing radiation, initial encounter: Secondary | ICD-10-CM

## 2023-03-27 DIAGNOSIS — L814 Other melanin hyperpigmentation: Secondary | ICD-10-CM

## 2023-03-27 DIAGNOSIS — D485 Neoplasm of uncertain behavior of skin: Secondary | ICD-10-CM

## 2023-03-27 DIAGNOSIS — L578 Other skin changes due to chronic exposure to nonionizing radiation: Secondary | ICD-10-CM | POA: Diagnosis not present

## 2023-03-27 DIAGNOSIS — D229 Melanocytic nevi, unspecified: Secondary | ICD-10-CM

## 2023-03-27 NOTE — Patient Instructions (Addendum)
Wound Care Instructions  Cleanse wound gently with soap and water once a day then pat dry with clean gauze. Apply a thin coat of Petrolatum (petroleum jelly, "Vaseline") over the wound (unless you have an allergy to this). We recommend that you use a new, sterile tube of Vaseline. Do not pick or remove scabs. Do not remove the yellow or white "healing tissue" from the base of the wound.  Cover the wound with fresh, clean, nonstick gauze and secure with paper tape. You may use Band-Aids in place of gauze and tape if the wound is small enough, but would recommend trimming much of the tape off as there is often too much. Sometimes Band-Aids can irritate the skin.  You should call the office for your biopsy report after 1 week if you have not already been contacted.  If you experience any problems, such as abnormal amounts of bleeding, swelling, significant bruising, significant pain, or evidence of infection, please call the office immediately.  FOR ADULT SURGERY PATIENTS: If you need something for pain relief you may take 1 extra strength Tylenol (acetaminophen) AND 2 Ibuprofen (200mg  each) together every 4 hours as needed for pain. (do not take these if you are allergic to them or if you have a reason you should not take them.) Typically, you may only need pain medication for 1 to 3 days.         Due to recent changes in healthcare laws, you may see results of your pathology and/or laboratory studies on MyChart before the doctors have had a chance to review them. We understand that in some cases there may be results that are confusing or concerning to you. Please understand that not all results are received at the same time and often the doctors may need to interpret multiple results in order to provide you with the best plan of care or course of treatment. Therefore, we ask that you please give [place] 2 business days to thoroughly review all your results before contacting the office for clarification.  Should we see a critical lab result, you will be contacted sooner.   If You Need Anything After Your Visit  If you have any questions or concerns for your doctor, please call our main line at [phone] and press option 4 to reach your doctor's medical assistant. If no one answers, please leave a voicemail as directed and we will return your call as soon as possible. Messages left after 4 pm will be answered the following business day.   You may also send [place] a message via MyChart. We typically respond to MyChart messages within 1-2 business days.  For prescription refills, please ask your pharmacy to contact our office. Our fax number is [phone].  If you have an urgent issue when the clinic is closed that cannot wait until the next business day, you can page your doctor at the number below.    Please note that while we do our best to be available for urgent issues outside of office hours, we are not available 24/7.   If you have an urgent issue and are unable to reach [place], you may choose to seek medical care at your doctor's office, retail clinic, urgent care center, or emergency room.  If you have a medical emergency, please immediately call 911 or go to the emergency department.  Pager Numbers  - Dr. Gwen Pounds: [phone]  - Dr. Roseanne Reno: [phone]  - Dr. Katrinka Blazing: [phone]   In the event of inclement weather, please  call our main line at [phone] for an update on the status of any delays or closures.  Dermatology Medication Tips: Please keep the boxes that topical medications come in in order to help keep track of the instructions about where and how to use these. Pharmacies typically print the medication instructions only on the boxes and not directly on the medication tubes.   If your medication is too expensive, please contact our office at [phone] option 4 or send [place] a message through MyChart.   We are unable to tell what your co-pay for medications will be  in advance as this is different depending on your insurance coverage. However, we may be able to find a substitute medication at lower cost or fill out paperwork to get insurance to cover a needed medication.   If a prior authorization is required to get your medication covered by your insurance company, please allow [place] 1-2 business days to complete this process.  Drug prices often vary depending on where the prescription is filled and some pharmacies may offer cheaper prices.  The website www.goodrx.com contains coupons for medications through different pharmacies. The prices here do not account for what the cost may be with help from insurance (it may be cheaper with your insurance), but the website can give you the price if you did not use any insurance.  - You can print the associated coupon and take it with your prescription to the pharmacy.  - You may also stop by our office during regular business hours and pick up a GoodRx coupon card.  - If you need your prescription sent electronically to a different pharmacy, notify our office through [facility] or by phone at [phone] option 4.     Si Usted Necesita Algo Despus de Su Visita  Tambin puede enviarnos un mensaje a travs de Clinical cytogeneticist. Por lo general respondemos a los mensajes de MyChart en el transcurso de 1 a 2 das hbiles.  Para renovar recetas, por favor pida a su farmacia que se ponga en contacto con nuestra oficina. Annie Sable de fax es [place] [phone].  Si tiene un asunto urgente cuando la clnica est cerrada y que no puede esperar hasta el siguiente da hbil, puede llamar/localizar a su doctor(a) al nmero que aparece a continuacin.   Por favor, tenga en cuenta que aunque hacemos todo lo posible para estar disponibles para asuntos urgentes fuera del horario de [place], no estamos disponibles las 24 horas del da, los 7 [address]  [address] de la [place].   Si tiene un problema urgente y no puede comunicarse con nosotros,  puede optar por buscar atencin mdica  en el consultorio de su doctor(a), en una clnica privada, en un centro de atencin urgente o en una sala de emergencias.  Si tiene Engineer, drilling, por favor llame inmediatamente al 911 o vaya a la sala de emergencias.  Nmeros de bper  - Dr. Gwen Pounds: [phone]  - Dra. Roseanne Reno: [phone]  - Dr. Katrinka Blazing: [phone]   En caso de inclemencias del tiempo, por favor llame a Lacy Duverney principal al [phone] para una actualizacin sobre el [place] de cualquier retraso o cierre.  Consejos para la medicacin en dermatologa: Por favor, guarde las cajas en las que vienen los medicamentos de uso tpico para ayudarle a seguir las instrucciones sobre dnde y cmo usarlos. Las farmacias generalmente imprimen las instrucciones del medicamento slo en las cajas y no directamente en los tubos del [place].   Si su medicamento es [employer],  por favor, pngase en contacto con nuestra oficina llamando al [phone] y presione la opcin 4 o envenos un mensaje a travs de Clinical cytogeneticist.   No podemos decirle cul ser su copago por los medicamentos por adelantado ya que esto es diferente dependiendo de la cobertura de su seguro. Sin embargo, es posible que podamos encontrar un medicamento sustituto a Audiological scientist un formulario para que el seguro cubra el medicamento que se considera necesario.   Si se requiere una autorizacin previa para que su compaa de seguros [place] su medicamento, por favor permtanos de 1 a 2 das hbiles para completar [address]  Los precios de los medicamentos varan con frecuencia dependiendo del Environmental consultant de dnde se surte la receta y alguna farmacias pueden ofrecer precios ms baratos.  El sitio web www.goodrx.com tiene cupones para medicamentos de Health and safety inspector. Los precios aqu no tienen en cuenta lo que podra costar con la ayuda del seguro (puede ser ms barato con su seguro), pero el sitio web puede darle  el precio si no utiliz Tourist information centre manager.  - Puede imprimir el cupn correspondiente y llevarlo con su receta a la farmacia.  - Tambin puede pasar por nuestra oficina durante el horario de atencin regular y Education officer, museum una tarjeta de cupones de GoodRx.  - Si necesita que su receta se enve electrnicamente a una farmacia diferente, informe a nuestra oficina a travs de MyChart de [place] o por telfono llamando al [phone] y presione la opcin 4.

## 2023-03-27 NOTE — Progress Notes (Signed)
Follow-Up Visit   Subjective  [name] Ferguson is a 82 y.o. male who presents for the following: Irregular skin lesions on the scalp that are tender to the touch. Patient is concerned and would like them checked today.  The patient has spots, moles and lesions to be evaluated, some may be new or changing and the patient may have concern these could be cancer.   The following portions of the chart were reviewed this encounter and updated as appropriate: medications, allergies, medical history  Review of Systems:  No other skin or systemic complaints except as noted in HPI or Assessment and Plan.  Objective  Well appearing patient in no apparent distress; mood and affect are within normal limits.   A focused examination was performed of the following areas: the face, scalp, arms, and hands   Relevant exam findings are noted in the Assessment and Plan.  vertex and frontal scalp x 8, R frontal scalp x 1, R central scalp x 1 (10) Erythematous thin papules/macules with gritty scale.   Posterior vertex scalp Pink papule with central scale 0.6 cm       Assessment & Plan   Purpura - Chronic; persistent and recurrent.  Treatable, but not curable. - Violaceous macules and patches - Benign - Related to trauma, age, sun damage and/or use of blood thinners, chronic use of topical and/or oral steroids - Observe - Can use OTC arnica containing moisturizer such as Dermend Bruise Formula if desired - Call for worsening or other concerns  ACTINIC DAMAGE - chronic, secondary to cumulative UV radiation exposure/sun exposure over time - diffuse scaly erythematous macules with underlying dyspigmentation - Recommend daily broad spectrum sunscreen SPF 30+ to sun-exposed areas, reapply every 2 hours as needed.  - Recommend staying in the shade or wearing long sleeves, sun glasses (UVA+UVB protection) and wide brim hats (4-inch brim around the entire circumference of the hat). - Call for new  or changing lesions.  SEBORRHEIC KERATOSIS - Stuck-on, waxy, tan-brown papules and/or plaques  - Benign-appearing - Discussed benign etiology and prognosis. - Observe - Call for any changes  LENTIGINES Exam: scattered tan macules Due to sun exposure Treatment Plan: Benign-appearing, observe. Recommend daily broad spectrum sunscreen SPF 30+ to sun-exposed areas, reapply every 2 hours as needed.  Call for any changes  MELANOCYTIC NEVI Exam: Tan-brown and/or pink-flesh-colored symmetric macules and papules  Treatment Plan: Benign appearing on exam today. Recommend observation. Call clinic for new or changing moles. Recommend daily use of broad spectrum spf 30+ sunscreen to sun-exposed areas.   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 They are chronic with expected duration over 1 year. A portion of actinic keratoses will progress to squamous cell carcinoma of the skin. It is not possible to reliably predict which spots will progress to skin cancer and so treatment is recommended to prevent development of skin cancer.  Recommend daily broad spectrum sunscreen SPF 30+ to sun-exposed areas, reapply every 2 hours as needed.  Recommend staying in the shade or wearing long sleeves, sun glasses (UVA+UVB protection) and wide brim hats (4-inch brim around the entire circumference of the hat). Call for new or changing lesions.   Destruction of lesion - vertex and frontal scalp x 8, R frontal scalp x 1, R central scalp x 1 (10) Complexity: simple  Destruction method: cryotherapy   Informed consent: discussed and consent obtained   Timeout:  patient name, date of birth, surgical site, and procedure verified Lesion destroyed using liquid nitrogen: Yes   Region frozen until ice ball extended beyond lesion: Yes    Outcome: patient tolerated procedure well with no complications   Post-procedure details: wound care instructions given    Neoplasm of uncertain behavior of skin Posterior vertex scalp  Skin / nail biopsy Type of biopsy: tangential   Informed consent: discussed and consent obtained   Timeout: patient name, date of birth, surgical site, and procedure verified   Procedure prep:  Patient was prepped and draped in usual sterile fashion Prep type:  Isopropyl alcohol Anesthesia: the lesion was anesthetized in a standard fashion   Anesthetic:  1% lidocaine w/ epinephrine 1-100,000 buffered w/ 8.4% NaHCO3 Instrument used: flexible razor blade   Hemostasis achieved with: pressure, aluminum chloride and electrodesiccation   Outcome: patient tolerated procedure well   Post-procedure details: sterile dressing applied and wound care instructions given   Dressing type: bandage and petrolatum    Specimen 1 - Surgical pathology Differential Diagnosis: D48.5 r/o SCC  Check Margins: No   Return for appointment as scheduled.  Maylene Roes, CMA, am acting as scribe for Elie Goody, MD .  Documentation: I have reviewed the above documentation for accuracy and completeness, and I agree with the above.  Elie Goody, MD

## 2023-03-30 LAB — SURGICAL PATHOLOGY

## 2023-04-04 ENCOUNTER — Telehealth: Payer: Self-pay

## 2023-04-04 NOTE — Telephone Encounter (Signed)
Called patient N/A. LMOVM to C/B for pathology results and treatment plan.

## 2023-04-04 NOTE — Telephone Encounter (Signed)
-----   Message from [place] sent at 03/31/2023  9:35 AM EDT ----- Diagnosis: WELL DIFFERENTIATED SQUAMOUS CELL CARCINOMA  Please call with diagnosis and determine where the patient would like to have Mohs surgery.  Explanation: This is a squamous cell skin cancer that has grown beyond the surface of the skin and is invading the second layer of the skin. It has the potential to spread beyond the skin and threaten your health, so I recommend treating it.  Treatment: Given the location and type of skin cancer, I recommend Mohs surgery. Mohs surgery involves cutting out the skin cancer and then checking under the microscope to ensure the whole skin cancer was removed. If any skin cancer remains, the surgeon will cut out more until it is fully removed. The cure rate is about 98-99%. Once the Mohs surgeon confirms the skin cancer is out, they will discuss the options to repair or heal the area. You must take it easy for about two weeks after surgery (no lifting over 10-15 lbs, avoid activity to get your heart rate and blood pressure up). It is done at another office outside of [place] ([place], [place], or [place]).  If the patient asks for my recommendation for Mohs surgeon, I recommend Dr Caprice Beaver or Dr Coralie Carpen at [facility] if the patient is able to make the trip.

## 2023-04-05 NOTE — Telephone Encounter (Signed)
Surgery scheduled

## 2023-04-05 NOTE — Telephone Encounter (Signed)
Patient has asked if Dr. Gwen Pounds would look at BX report to see if this something that he would be okay handling in office so he would not have to travel.  Spoke with Dr. Katrinka Blazing and he highly encourages patient to do the [facility] surgery but okay with Dr. Gwen Pounds giving second opinion and scheduling in office if possible.    Dr. Gwen Pounds please review. Thank you!

## 2023-04-11 ENCOUNTER — Encounter: Payer: Self-pay | Admitting: Dermatology

## 2023-04-11 ENCOUNTER — Ambulatory Visit: Payer: Medicare Other | Admitting: Dermatology

## 2023-04-11 VITALS — BP 117/61

## 2023-04-11 DIAGNOSIS — C4492 Squamous cell carcinoma of skin, unspecified: Secondary | ICD-10-CM

## 2023-04-11 DIAGNOSIS — D492 Neoplasm of unspecified behavior of bone, soft tissue, and skin: Secondary | ICD-10-CM

## 2023-04-11 DIAGNOSIS — L570 Actinic keratosis: Secondary | ICD-10-CM | POA: Diagnosis not present

## 2023-04-11 DIAGNOSIS — C4442 Squamous cell carcinoma of skin of scalp and neck: Secondary | ICD-10-CM

## 2023-04-11 MED ORDER — MUPIROCIN 2 % EX OINT
1.0000 | TOPICAL_OINTMENT | Freq: Every day | CUTANEOUS | 1 refills | Status: DC
Start: 2023-04-11 — End: 2023-10-05

## 2023-04-11 MED ORDER — DOXYCYCLINE MONOHYDRATE 100 MG PO CAPS
100.0000 mg | ORAL_CAPSULE | Freq: Two times a day (BID) | ORAL | 0 refills | Status: AC
Start: 2023-04-11 — End: 2023-04-18

## 2023-04-11 NOTE — Patient Instructions (Signed)
Wound Care Instructions  On the day following your surgery, you should begin doing daily dressing changes: Remove the old dressing and discard it. Cleanse the wound gently with tap water. This may be done in the shower or by placing a wet gauze pad directly on the wound and letting it soak for several minutes. It is important to gently remove any dried blood from the wound in order to encourage healing. This may be done by gently rolling a moistened Q-tip on the dried blood. Do not pick at the wound. If the wound should start to bleed, continue cleaning the wound, then place a moist gauze pad on the wound and hold pressure for a few minutes.  Make sure you then dry the skin surrounding the wound completely or the tape will not stick to the skin. Do not use cotton balls on the wound. After the wound is clean and dry, apply the ointment gently with a Q-tip. Cut a non-stick pad to fit the size of the wound. Lay the pad flush to the wound. If the wound is draining, you may want to reinforce it with a small amount of gauze on top of the non-stick pad for a little added compression to the area. Use the tape to seal the area completely. Select from the following with respect to your individual situation: If your wound has been stitched closed: continue the above steps 1-8 at least daily until your sutures are removed. If your wound has been left open to heal: continue steps 1-8 at least daily for the first 3-4 weeks. We would like for you to take a few extra precautions for at least the next week. Sleep with your head elevated on pillows if our wound is on your head. Do not bend over or lift heavy items to reduce the chance of elevated blood pressure to the wound Do not participate in particularly strenuous activities.   Below is a list of dressing supplies you might need.  Cotton-tipped applicators - Q-tips Gauze pads (2x2 and/or 4x4) - All-Purpose Sponges Non-stick dressing material - Telfa Tape -  Paper or Hypafix New and clean tube of petroleum jelly - Vaseline    Comments on Post-Operative Period Slight swelling and redness often appear around the wound. This is normal and will disappear within several days following the surgery. The healing wound will drain a brownish-red-yellow discharge during healing. This is a normal phase of wound healing. As the wound begins to heal, the drainage may increase in amount. Again, this drainage is normal. Notify us if the drainage becomes persistently bloody, excessively swollen, or intensely painful or develops a foul odor or red streaks.  If you should experience mild discomfort during the healing phase, you may take an aspirin-free medication such as Tylenol (acetaminophen). Notify us if the discomfort is severe or persistent. Avoid alcoholic beverages when taking pain medicine.  In Case of Wound Hemorrhage A wound hemorrhage is when the bandage suddenly becomes soaked with bright red blood and flows profusely. If this happens, sit down or lie down with your head elevated. If the wound has a dressing on it, do not remove the dressing. Apply pressure to the existing gauze. If the wound is not covered, use a gauze pad to apply pressure and continue applying the pressure for 20 minutes without peeking. DO NOT COVER THE WOUND WITH A LARGE TOWEL OR WASH CLOTH. Release your hand from the wound site but do not remove the dressing. If the bleeding has stopped,   gently clean around the wound. Leave the dressing in place for 24 hours if possible. This wait time allows the blood vessels to close off so that you do not spark a new round of bleeding by disrupting the newly clotted blood vessels with an immediate dressing change. If the bleeding does not subside, continue to hold pressure. If matters are out of your control, contact an After Hours clinic or go to the Emergency Room.

## 2023-04-11 NOTE — Progress Notes (Unsigned)
Follow-Up Visit   Subjective  [name] Ferguson is a 82 y.o. male who presents for the following: SCC bx proven, post vertex scalp, pt presents for excision today The patient has spots, moles and lesions to be evaluated, some may be new or changing and the patient may have concern these could be cancer.   The following portions of the chart were reviewed this encounter and updated as appropriate: medications, allergies, medical history  Review of Systems:  No other skin or systemic complaints except as noted in HPI or Assessment and Plan.  Objective  Well appearing patient in no apparent distress; mood and affect are within normal limits.  A focused examination was performed of the following areas: scalp  Relevant exam findings are noted in the Assessment and Plan.  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  Skin excision  Lesion length (cm):  1.1 Lesion width (cm):  1.1 Margin per side (cm):  0.2 Total excision diameter (cm):  1.5 Informed consent: discussed and consent obtained   Timeout: patient name, date of birth, surgical site, and procedure verified   Procedure prep:  Patient was prepped and draped in usual sterile fashion Prep type:  Isopropyl alcohol and povidone-iodine Anesthesia: the lesion was anesthetized in a standard fashion   Anesthetic:  1% lidocaine w/ epinephrine 1-100,000 buffered w/ 8.4% NaHCO3 (3cc lido w/ epi, 3cc bupivicaine, Total of 6cc) Instrument used comment:  #15c blade Hemostasis achieved with: pressure   Hemostasis achieved with comment:  Electrocautery Outcome: patient tolerated procedure well with no complications   Post-procedure details: sterile dressing applied and wound care instructions given   Dressing type: bandage and pressure dressing (Mupirocin)    mupirocin ointment  (BACTROBAN) 2 % Apply 1 Application topically daily. qd to excision site on scalp  Skin repair Complexity:  Complex Final length (cm):  3 Reason for type of repair: reduce tension to allow closure, reduce the risk of dehiscence, infection, and necrosis, reduce subcutaneous dead space and avoid a hematoma, allow closure of the large defect, preserve normal anatomy, preserve normal anatomical and functional relationships and enhance both functionality and cosmetic results   Undermining: area extensively undermined   Undermining comment:  Undermining Defect 1.5cm Subcutaneous layers (deep stitches):  Suture size:  3-0 Suture type: Vicryl (polyglactin 910)   Subcutaneous suture technique: Inverted Dermal. Fine/surface layer approximation (top stitches):  Suture size:  3-0 Suture type: nylon   Stitches: horizontal mattress   Suture removal (days):  7 Hemostasis achieved with: pressure Outcome: patient tolerated procedure well with no complications   Post-procedure details: sterile dressing applied and wound care instructions given   Dressing type: bandage, pressure dressing and bacitracin (Mupirocin)    doxycycline (MONODOX) 100 MG capsule Take 1 capsule (100 mg total) by mouth 2 (two) times daily for 7 days. Take with food and drink  Specimen 1 - Surgical pathology Differential Diagnosis: Bx proven SCC  Check Margins: yes Pink bx site 1.1cm [phone]  SCC bx proven, excised today Start Mupirocin oint qd to excision site Start Doxycycline 100mg  1 po bid with food and drinks for 7 days, pt was instructed to d/c Doxycycline 20mg  until he finishes his week course of Doxycycline 100  Doxycycline should be taken with food to prevent nausea. Do not lay down for 30 minutes after taking.  Be cautious with sun exposure and use good sun protection while on this medication. Pregnant women should not take this medication.    Neoplasm of skin 1.5cm sup medial to SCC site post vertex  scalp  Epidermal / dermal shaving  Lesion diameter (cm):  0.6 Informed consent: discussed and consent obtained   Timeout: patient name, date of birth, surgical site, and procedure verified   Procedure prep:  Patient was prepped and draped in usual sterile fashion Prep type:  Isopropyl alcohol Anesthesia: the lesion was anesthetized in a standard fashion   Anesthetic:  1% lidocaine w/ epinephrine 1-100,000 buffered w/ 8.4% NaHCO3 Instrument used: flexible razor blade   Hemostasis achieved with: pressure, aluminum chloride and electrodesiccation   Outcome: patient tolerated procedure well   Post-procedure details: sterile dressing applied and wound care instructions given   Dressing type: bandage and petrolatum    Destruction of lesion Complexity: extensive   Destruction method: electrodesiccation and curettage   Informed consent: discussed and consent obtained   Timeout:  patient name, date of birth, surgical site, and procedure verified Procedure prep:  Patient was prepped and draped in usual sterile fashion Prep type:  Isopropyl alcohol Anesthesia: the lesion was anesthetized in a standard fashion   Anesthetic:  1% lidocaine w/ epinephrine 1-100,000 buffered w/ 8.4% NaHCO3 Curettage performed in three different directions: Yes   Electrodesiccation performed over the curetted area: Yes   Lesion length (cm):  0.6 Lesion width (cm):  0.6 Margin per side (cm):  0.2 Final wound size (cm):  1 Hemostasis achieved with:  pressure, aluminum chloride and electrodesiccation Outcome: patient tolerated procedure well with no complications   Post-procedure details: sterile dressing applied and wound care instructions given   Dressing type: bandage and petrolatum    Specimen 2 - Surgical pathology Differential Diagnosis: [id number] R/O SCC  Check Margins: yes 0.6cm hyperkeratotic pap EDC     Return in about 1 week (around 04/18/2023) for suture removal.  I, Ardis Rowan, RMA, am acting as  scribe for Armida Sans, MD .   Documentation: I have reviewed the above documentation for accuracy and completeness, and I agree with the above.  Armida Sans, MD

## 2023-04-13 LAB — SURGICAL PATHOLOGY

## 2023-04-17 ENCOUNTER — Telehealth: Payer: Self-pay

## 2023-04-17 NOTE — Telephone Encounter (Signed)
-----   Message from Armida Sans sent at 04/13/2023  5:51 PM EDT ----- FINAL DIAGNOSIS        1. Skin (M), posterior vertex scalp :       NO RESIDUAL SQUAMOUS CELL CARCINOMA, MARGINS FREE        2. Skin, 1.5cm sup medial to SCC site post vertex scalp :       ACTINIC KERATOSIS, CLOSE TO MARGIN   1- Site of Cancer = SCC Margins Clear 2- PreCancer  Already treated  Recheck next visit

## 2023-04-17 NOTE — Telephone Encounter (Signed)
Patient informed of pathology results 

## 2023-04-18 ENCOUNTER — Ambulatory Visit (INDEPENDENT_AMBULATORY_CARE_PROVIDER_SITE_OTHER): Payer: Medicare Other | Admitting: Dermatology

## 2023-04-18 ENCOUNTER — Encounter: Payer: Self-pay | Admitting: Dermatology

## 2023-04-18 DIAGNOSIS — Z8589 Personal history of malignant neoplasm of other organs and systems: Secondary | ICD-10-CM

## 2023-04-18 NOTE — Progress Notes (Signed)
   Follow-Up Visit   Subjective  [name] Ferguson is a 82 y.o. male who presents for the following: Suture removal  Pathology showed NO RESIDUAL SQUAMOUS CELL CARCINOMA, MARGINS FREE at posterior vertex scalp  The following portions of the chart were reviewed this encounter and updated as appropriate: medications, allergies, medical history  Review of Systems:  No other skin or systemic complaints except as noted in HPI or Assessment and Plan.  Objective  Well appearing patient in no apparent distress; mood and affect are within normal limits.  Areas Examined: scalp Relevant physical exam findings are noted in the Assessment and Plan.    Assessment & Plan    Encounter for Removal of Sutures - Incision site is clean, dry and intact. - Wound cleansed, sutures removed, wound cleansed and steri strips applied.  - Discussed pathology results showing NO RESIDUAL SQUAMOUS CELL CARCINOMA, MARGINS FREE at posterior vertex scalp - Patient advised to keep steri-strips dry until they fall off. - Scars remodel for a full year. - Once steri-strips fall off, patient can apply over-the-counter silicone scar cream once to twice a day to help with scar remodeling if desired. - Patient advised to call with any concerns or if they notice any new or changing lesions.  Return for keep follow up as scheduled .  IAsher Muir, CMA, am acting as scribe for Armida Sans, MD.   Documentation: I have reviewed the above documentation for accuracy and completeness, and I agree with the above.  Armida Sans, MD

## 2023-04-18 NOTE — Patient Instructions (Signed)

## 2023-04-21 ENCOUNTER — Ambulatory Visit: Payer: Medicare Other | Admitting: Podiatry

## 2023-04-21 ENCOUNTER — Encounter: Payer: Self-pay | Admitting: Podiatry

## 2023-04-21 VITALS — BP 120/76

## 2023-04-21 DIAGNOSIS — E1142 Type 2 diabetes mellitus with diabetic polyneuropathy: Secondary | ICD-10-CM

## 2023-04-21 DIAGNOSIS — B351 Tinea unguium: Secondary | ICD-10-CM

## 2023-04-21 DIAGNOSIS — M79675 Pain in left toe(s): Secondary | ICD-10-CM | POA: Diagnosis not present

## 2023-04-21 DIAGNOSIS — M79674 Pain in right toe(s): Secondary | ICD-10-CM | POA: Diagnosis not present

## 2023-04-27 NOTE — Progress Notes (Signed)
Subjective:  Patient ID: [name] Ferguson, male    DOB: 16-May-1941,  MRN: [id number]  82 y.o. male presents at risk foot care with history of diabetic neuropathy and painful elongated mycotic toenails 1-5 bilaterally which are tender when wearing enclosed shoe gear. Pain is relieved with periodic professional debridement. Patient states he is headed to the gym this morning. Chief Complaint  Patient presents with   RFC   Diabetes    BS-189 A1C-6.3 PCPV-12/2022    New problem(s): None   PCP is Lauro Regulus, MD.  Allergies  Allergen Reactions   Statins Other (See Comments)    Suspected cause of muscle pain   Metformin Diarrhea    Review of Systems: Negative except as noted in the HPI.   Objective:  SOCTT OBROCHTA is a pleasant 82 y.o. male obese in NAD. AAO x 3.  Vascular Examination: Vascular status intact b/l with palpable pedal pulses. CFT <3 seconds b/l. Pedal hair present. No edema. No pain with calf compression b/l. Skin temperature gradient WNL b/l. No varicosities noted. No cyanosis or clubbing noted.  Neurological Examination: Pt has subjective symptoms of neuropathy. Sensation grossly intact b/l with 10 gram monofilament. Vibratory sensation intact b/l.  Dermatological Examination: Pedal skin with normal turgor, texture and tone b/l. No open wounds nor interdigital macerations noted. Toenails 1-5 b/l thick, discolored, elongated with subungual debris and pain on dorsal palpation. No hyperkeratotic lesions noted b/l.   Musculoskeletal Examination: Muscle strength 5/5 to b/l LE.  No pain, crepitus noted b/l. No gross pedal deformities. Utilizes cane for ambulation assistance.  Radiographs: None  Assessment:   1. Pain due to onychomycosis of toenails of both feet   2. Diabetic polyneuropathy associated with type 2 diabetes mellitus (HCC)    Plan:   -Patient was evaluated and treated. All patient's and/or POA's questions/concerns answered on today's  visit. -Continue foot and shoe inspections daily. Monitor blood glucose per PCP/Endocrinologist's recommendations. -Continue supportive shoe gear daily. -Mycotic toenails 1-5 bilaterally were debrided in length and girth with sterile nail nippers and dremel without incident. -Patient/POA to call should there be question/concern in the interim.  Return in about 10 weeks (around 06/30/2023).  Freddie Breech, DPM

## 2023-04-28 ENCOUNTER — Ambulatory Visit (INDEPENDENT_AMBULATORY_CARE_PROVIDER_SITE_OTHER): Payer: Medicare Other

## 2023-04-28 DIAGNOSIS — E1142 Type 2 diabetes mellitus with diabetic polyneuropathy: Secondary | ICD-10-CM

## 2023-04-28 DIAGNOSIS — M2041 Other hammer toe(s) (acquired), right foot: Secondary | ICD-10-CM

## 2023-04-28 DIAGNOSIS — M2042 Other hammer toe(s) (acquired), left foot: Secondary | ICD-10-CM | POA: Diagnosis not present

## 2023-04-28 DIAGNOSIS — M2010 Hallux valgus (acquired), unspecified foot: Secondary | ICD-10-CM

## 2023-04-28 DIAGNOSIS — M79674 Pain in right toe(s): Secondary | ICD-10-CM

## 2023-04-28 NOTE — Progress Notes (Signed)

## 2023-06-23 NOTE — Progress Notes (Signed)
No show

## 2023-06-26 ENCOUNTER — Ambulatory Visit: Payer: Medicare Other | Admitting: Internal Medicine

## 2023-06-30 ENCOUNTER — Ambulatory Visit (INDEPENDENT_AMBULATORY_CARE_PROVIDER_SITE_OTHER): Payer: Medicare Other | Admitting: Podiatry

## 2023-06-30 ENCOUNTER — Encounter: Payer: Self-pay | Admitting: Podiatry

## 2023-06-30 DIAGNOSIS — B351 Tinea unguium: Secondary | ICD-10-CM | POA: Diagnosis not present

## 2023-06-30 DIAGNOSIS — M2042 Other hammer toe(s) (acquired), left foot: Secondary | ICD-10-CM

## 2023-06-30 DIAGNOSIS — M2041 Other hammer toe(s) (acquired), right foot: Secondary | ICD-10-CM

## 2023-06-30 DIAGNOSIS — M79674 Pain in right toe(s): Secondary | ICD-10-CM | POA: Diagnosis not present

## 2023-06-30 DIAGNOSIS — E1142 Type 2 diabetes mellitus with diabetic polyneuropathy: Secondary | ICD-10-CM

## 2023-06-30 DIAGNOSIS — E119 Type 2 diabetes mellitus without complications: Secondary | ICD-10-CM | POA: Diagnosis not present

## 2023-06-30 DIAGNOSIS — M79675 Pain in left toe(s): Secondary | ICD-10-CM | POA: Diagnosis not present

## 2023-06-30 NOTE — Progress Notes (Signed)
[facility] [address] [place], [place] [address]  Pulmonary Sleep Medicine   Office Visit Note  Patient Name: [name] Ferguson DOB: August 03, 1940 MRN [id number]    Chief Complaint: Obstructive Sleep Apnea visit  Brief History:  [name] Ferguson is seen today for an annual follow up on Auto BiPAP Max IPAP 25, min EPAP 10, PS 6.  The patient has a 16 year history of sleep apnea. Patient is using PAP nightly.  The patient feels rested after sleeping with PAP.  The patient reports benefiting from PAP use. Reported sleepiness is  improved and the Epworth Sleepiness Score is 8 out of 24. The patient will occasionally take naps. The patient complains of the following: pt is complaining of some dryness. Addressed by discussing frequent changes of seals.  The compliance download shows  98% compliance with an average use time of 7.5 hours. The AHI is 4.5  The patient does not complain of limb movements disrupting sleep. The patient continues to require PAP therapy in order to eliminate sleep apnea. He was recently hospitalized for two heart stents. Echo was performed   ROS  General: (-) fever, (-) chills, (-) night sweat Nose and Sinuses: (-) nasal stuffiness or itchiness, (-) postnasal drip, (-) nosebleeds, (-) sinus trouble. Mouth and Throat: (-) sore throat, (-) hoarseness. Neck: (-) swollen glands, (-) enlarged thyroid, (-) neck pain. Respiratory: - cough, +shortness of breath, - wheezing. Neurologic: - numbness, - tingling. Psychiatric: - anxiety, - depression   Current Medication: Outpatient Encounter Medications as of 07/03/2023  Medication Sig   clopidogrel (PLAVIX) 75 MG tablet Take by mouth.   JARDIANCE 10 MG TABS tablet Take by mouth.   acyclovir (ZOVIRAX) 400 MG tablet TK 1 T PO TID FOR 5 DAYS FOR EACH FEVER BLISTER OUTBREAK (Patient not taking: Reported on 03/27/2023)   amiodarone (PACERONE) 200 MG tablet Take 1.5 tablets by mouth daily.   Aspirin-Calcium Carbonate 81-777 MG  TABS Take by mouth.   Besifloxacin HCl 0.6 % SUSP Apply to eye.   Blood Glucose Monitoring Suppl (ONETOUCH VERIO IQ SYSTEM) w/Device KIT U UTD TO CHECK BS QID   brimonidine (ALPHAGAN) 0.15 % ophthalmic solution Place 1 drop into the left eye 2 (two) times daily.   Brinzolamide-Brimonidine 1-0.2 % SUSP Apply 1 drop to eye 2 (two) times daily.   dorzolamide (TRUSOPT) 2 % ophthalmic solution Administer 2 drops to both eyes Two (2) times a day.   doxycycline (PERIOSTAT) 20 MG tablet Take 1 tablet (20 mg total) by mouth daily. Take 1 po qd with dinner meal   ELIQUIS 2.5 MG TABS tablet Take 2.5 mg by mouth 2 (two) times daily.   ezetimibe (ZETIA) 10 MG tablet Take 1 tablet by mouth daily.   FLUAD 0.5 ML SUSY TO BE ADMINISTERED BY PHARMACIST FOR IMMUNIZATION   fluticasone (FLONASE) 50 MCG/ACT nasal spray U 1 SPR NASALLY BID   glucagon 1 MG injection Use as directed for emergent low blood sugar,   glucose blood test strip TEST 3 TIMES DAILY   HYDROcodone-acetaminophen (NORCO/VICODIN) 5-325 MG tablet Take 1 tablet by mouth every 8 (eight) hours as needed.   insulin detemir (LEVEMIR) 100 UNIT/ML FlexPen Inject 35-40 Units into the skin at bedtime. Says 50 units is prescribed but will usually take 35-40 at night   insulin glargine (LANTUS) 100 UNIT/ML Solostar Pen Inject into the skin.   Insulin Pen Needle (PEN NEEDLES 31GX5/16") 31G X 8 MM MISC as directed   INVOKANA 100 MG TABS  tablet    isosorbide mononitrate (IMDUR) 30 MG 24 hr tablet Take 30 mg by mouth daily.    KLOR-CON M20 20 MEQ tablet Take 20 mEq by mouth 2 (two) times daily.   latanoprost (XALATAN) 0.005 % ophthalmic solution    levothyroxine (SYNTHROID) 50 MCG tablet PLEASE SEE ATTACHED FOR DETAILED DIRECTIONS   metFORMIN (GLUCOPHAGE-XR) 500 MG 24 hr tablet Take 1,000 mg by mouth 2 (two) times daily.   metoprolol succinate (TOPROL-XL) 25 MG 24 hr tablet Take 1 tablet by mouth daily.   Multiple Vitamins-Minerals (MULTIVITAMIN ADULT PO) Take  by mouth.   mupirocin ointment (BACTROBAN) 2 % Apply 1 Application topically daily. qd to excision site on scalp   nitroGLYCERIN (NITROSTAT) 0.4 MG SL tablet Place under the tongue.   Omega-3 Fatty Acids (OMEGA-3 FISH OIL PO) Take by mouth.   ONETOUCH DELICA LANCETS 33G MISC USE 1 LANCET 3 TIMES A DAY   potassium chloride (KLOR-CON) 10 MEQ tablet Take 10 mEq by mouth daily.   pravastatin (PRAVACHOL) 40 MG tablet Take 1 tablet by mouth every evening.   prednisoLONE acetate (PRED FORTE) 1 % ophthalmic suspension PLACE 1 DROP INTO THE RIGHT EYE 6 (SIX) TIMES DAILY   REPATHA SURECLICK 140 MG/ML SOAJ Inject 1 mL into the skin every 14 (fourteen) days.   tamsulosin (FLOMAX) 0.4 MG CAPS capsule TAKE 1 CAPSULE(0.4 MG) BY MOUTH DAILY   torsemide (DEMADEX) 20 MG tablet Take by mouth.   TRADJENTA 5 MG TABS tablet    triamcinolone cream (KENALOG) 0.1 % Apply topically.   TRULICITY 0.75 MG/0.5ML SOPN Inject 0.75 mg into the skin once a week.   UNABLE TO FIND Take by mouth.   VASCEPA 0.5 g CAPS Take 4 capsules by mouth 2 (two) times daily.   vitamin B-12 (CYANOCOBALAMIN) 500 MCG tablet Take 500 mcg by mouth daily.   vitamin C (ASCORBIC ACID) 500 MG tablet Take 500 mg by mouth daily.   [DISCONTINUED] lisinopril (ZESTRIL) 10 MG tablet Take 10 mg by mouth every morning.   No facility-administered encounter medications on file as of 07/03/2023.    Surgical History: Past Surgical History:  Procedure Laterality Date   BACK SURGERY     CARDIAC CATHETERIZATION     COLONOSCOPY     COLONOSCOPY WITH PROPOFOL N/A 09/22/2015   Procedure: COLONOSCOPY WITH PROPOFOL;  Surgeon: Midge Minium, MD;  Location: [facility] ENDOSCOPY;  Service: Endoscopy;  Laterality: N/A;   CORONARY ANGIOPLASTY WITH STENT PLACEMENT     CORONARY ARTERY BYPASS GRAFT  07/11/1994   HEMORRHOID SURGERY  07/11/2005   HEMORRHOID SURGERY  08/12/2015   excision internal hemorrhoid Dr Evette Cristal   JOINT REPLACEMENT Left 07/11/2002   Knee    Medical  History: Past Medical History:  Diagnosis Date   Actinic keratosis    Angina pectoris (HCC)    Atherosclerosis with limb claudication (HCC)    BPH (benign prostatic hypertrophy) with urinary obstruction    CHF (congestive heart failure) (HCC)    Diabetes mellitus without complication (HCC)    HLD (hyperlipidemia)    Hypertension    Obesity    Penile adhesions    Sleep apnea    SOB (shortness of breath)    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   Squamous cell carcinoma of skin 03/27/2023   Posterior vertex scalp. WD SCC. EXC 04/11/23   Stroke (  HCC)     Family History: Non contributory to the present illness  Social History: Social History   Socioeconomic History   Marital status: Married    Spouse name: Not on file   Number of children: Not on file   Years of education: Not on file   Highest education level: Not on file  Occupational History   Not on file  Tobacco Use   Smoking status: Former    Current packs/day: 0.00    Types: Cigarettes    Quit date: 07/11/1981    Years since quitting: 42.0   Smokeless tobacco: Never  Substance and Sexual Activity   Alcohol use: Yes    Comment: occasionally   Drug use: No   Sexual activity: Not on file  Other Topics Concern   Not on file  Social History Narrative   Not on file   Social Drivers of Health   Financial Resource Strain: Low Risk  (06/22/2023)   Received from [facility]   Overall Financial Resource Strain (CARDIA)    Difficulty of Paying Living Expenses: Not hard at all  Food Insecurity: No Food Insecurity (06/22/2023)   Received from [facility]   Hunger Vital Sign    Worried About Running Out of Food in the Last Year: Never true    Ran Out of Food in the Last Year: Never true  Transportation Needs: No Transportation Needs  (06/22/2023)   Received from [facility] - [facility]    In the past 12 months, has lack of transportation kept you from medical appointments or from getting medications?: No    Lack of Transportation (Non-Medical): No  Physical Activity: Sufficiently Active (05/17/2020)   Received from [facility], [facility]   Exercise Vital Sign    Days of Exercise per Week: 6 days    Minutes of Exercise per Session: 30 min  Stress: No Stress Concern Present (05/17/2020)   Received from [facility], [facility]   [employer] of Occupational [facility] - Occupational Stress Questionnaire    Feeling of Stress : Not at all  Social Connections: Not on file  Intimate Partner Violence: Not on file    Vital Signs: Blood pressure 125/77, pulse 70, resp. rate 18, height 5\' 6"  (1.676 m), weight 205 lb (93 kg), SpO2 98%. Body mass index is 33.09 kg/m.    Examination: General Appearance: The patient is well-developed, well-nourished, and in no distress. Neck Circumference: 46 cm Skin: Gross inspection of skin unremarkable. Head: normocephalic, no gross deformities. Eyes: no gross deformities noted. ENT: ears appear grossly normal Neurologic: Alert and oriented. No involuntary movements.  STOP BANG RISK ASSESSMENT S (snore) Have you been told that you snore?     NO   T (tired) Are you often tired, fatigued, or sleepy during the day?   NO  O (obstruction) Do you stop breathing, choke, or gasp during sleep? NO   P (pressure) Do you have or are you being treated for high blood pressure? YES   B (BMI) Is your body index greater than 35 kg/m? NO   A (age) Are you 1 years old or older? YES   N (neck) Do you have a neck circumference greater than 16 inches?   YES   G (gender) Are you a male? YES   TOTAL STOP/BANG "YES" ANSWERS 4       A STOP-Bang score of 2 or  less is considered low risk, and  a score of 5 or more is high risk for having either moderate or severe OSA. For people who score 3 or 4, doctors may need to perform further assessment to determine how likely they are to have OSA.         EPWORTH SLEEPINESS SCALE:  Scale:  (0)= no chance of dozing; (1)= slight chance of dozing; (2)= moderate chance of dozing; (3)= high chance of dozing  Chance  Situtation    Sitting and reading: 1    Watching TV: 1    Sitting Inactive in public: 1    As a passenger in car: 1      Lying down to rest: 3    Sitting and talking: 0    Sitting quielty after lunch: 1    In a car, stopped in traffic: 0   TOTAL SCORE:   8 out of 24    SLEEP STUDIES:  SPLIT (02/06/07) AHI 42, min SP02 79% PSG 09/01/2014 AHI 59.5/hr min SP02 86% Titration (09/08/2014) Auto BIPAP with EPAP min 10, PS 6, Max IPAP 25   CPAP COMPLIANCE DATA:  Date Range: 06/30/2022-06/29/2023  Average Daily Use: 7.5 hours  Median Use: 7.5  Compliance for > 4 Hours: 98% days  AHI: 4.5 respiratory events per hour  Days Used: 361/365  Mask Leak: 55.9  95th Percentile Pressure: IPAP 18.5, EPAP 12.5 PS 6         LABS: Recent Results (from the past 2160 hours)  Surgical pathology     Status: None   Collection Time: 04/11/23 12:00 AM  Result Value Ref Range   SURGICAL PATHOLOGY      SURGICAL PATHOLOGY [facility] [address] [place], [place] [address] Telephone [phone] or [phone] Fax [phone]  REPORT OF DERMATOPATHOLOGY   Accession #: [id number] Patient Name: LIZZIE, DAGAN Visit # : [id number]  MRN: [id number] Cytotechnologist: Birt Hugh Md, Efraim Kaufmann, Dermatopathologist, Electronic Signature DOB/Age Dec 04, 1940 (Age: 27) Gender: M Collected Date: 04/11/2023 Received Date: 04/11/2023  FINAL DIAGNOSIS       1. Skin (M), posterior vertex scalp :       NO RESIDUAL SQUAMOUS CELL CARCINOMA, MARGINS FREE       2. Skin, 1.5cm sup medial to SCC  site post vertex scalp :       ACTINIC KERATOSIS, CLOSE TO MARGIN       ELECTRONIC SIGNATURE : Munoz-Bishop Md, Melissa, Dermatopathologist, Electronic Signature  MICROSCOPIC DESCRIPTION 1. There are inflammatory and reactive changes consistent with a prior procedure.There is no residual squamous cell carcinoma and the margins are free of malignancy . 2. The epidermis exhibits partial thickness keratinocytic atypia and parakeratosis.There is solar elastosis in the dermis.This is an actinic keratosis.The lesion extends close to, but does not clearly involve, a surgical margin.  CASE COMMENTS STAINS USED IN DIAGNOSIS: H&E H&E H&E H&E    CLINICAL HISTORY  SPECIMEN(S) OBTAINED 1. Skin (M), Posterior Vertex Scalp 2. Skin, 1.5cm Sup Medial To SCC Site Post Vertex Scalp  SPECIMEN COMMENTS: 1. Pink bx site 1.1cm, check margins, [phone] 2. 0.6cm hyperkeratotic pap, check margins, EDC SPECIMEN CLINICAL INFORMATION: 1. Squamous cell carcinoma of skin, bx proven SCC 2. Neoplasm of skin, R/O SCC    Gross Description 1. Shape: Includes limited  fat; elliptical      Size: 16 x 11 x 6 mm      Lesion: 5 mm plaque      Orientation: None; all  margins inked      Block summary: Specimen has been serially sectioned and submitted      A = 4 cross sections      B = 2 radial sections      2  Block(s) submitted.  ( nn) 2. Formalin fixed specimen received:  15 X 9 X 2 MM, TOTO (5 P) (2 B) ( nn ) margins inked.      a-b=3        Report signed out from the following location(s) [facility] [address], [place] [address] CLIA #: [id number]  [facility] [address] [place], [place] [address] CLIA #: [id number]     Radiology: DG Hand Complete Right Result Date: 05/16/2020 CLINICAL DATA:  Status post fall. Laceration to fifth digit and swelling throughout the metacarpal bones. EXAM: RIGHT HAND - COMPLETE 3+ VIEW COMPARISON:   None FINDINGS: Marked dorsal soft tissue swelling is identified centered over the metacarpal phalangeal joints. There is a deformity involving the head of the third metacarpal bone. On the lateral radiograph there is an ossified density along the dorsal aspect of the head of the metatarsal bones which may reflect a displaced fracture fragment. Mild degenerative changes are noted within the DIP joints. IMPRESSION: 1. Marked dorsal soft tissue swelling. Deformity of the head of the third metatarsal bone is noted with suspicious ossific density along the dorsal aspect of the metacarpal heads, which is concerning for a displaced fracture fragment. 2. Osteoarthritis of the DIP joints. Electronically Signed   By: Signa Kell M.D.   On: 05/16/2020 11:32    No results found.  No results found.    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   Abnormal TSH 06/26/2019   Acquired hypothyroidism 06/26/2019   History of cardiac arrest 04/10/2019   Stage 3a chronic kidney disease (HCC) 04/10/2019   Coagulation disorder (HCC) 03/28/2019   Cardiac arrest with ventricular fibrillation (HCC) 03/18/2019   Moderate right ventricular systolic dysfunction 03/18/2019   Pain due to onychomycosis of toenails of both feet 01/17/2019   Diabetic neuropathy (HCC) 01/17/2019   On continuous oral anticoagulation 12/24/2018   Paroxysmal atrial fibrillation (HCC) 12/24/2018   Primary open angle glaucoma of both eyes, severe stage 12/04/2018   Pseudophakia  of both eyes 12/04/2018   Vision changes 12/04/2018   Stroke (HCC) 06/26/2017   Intramuscular hematoma 04/13/2017   Acute on chronic diastolic heart failure (HCC) 10/25/2016   OSA on CPAP 10/25/2016   Chest pain, atypical 10/24/2016   Shortness of breath 10/24/2016   Bacteremia due to Gram-negative bacteria 06/07/2016   Hypokalemia 06/07/2016   Hypomagnesemia 06/07/2016   Diarrhea, unspecified 06/06/2016   Thrombocytopenia (HCC) 06/06/2016   Sepsis due to urinary tract infection (HCC) 06/05/2016   BPH with obstruction/lower urinary tract symptoms 09/22/2015   Special screening for malignant neoplasms, colon    Rectal polyp    Adiposity 03/18/2014   Heart failure (HCC) 11/07/2013   Type 2 diabetes mellitus treated with insulin (HCC) 04/08/2011   HLD (hyperlipidemia) 04/08/2011   Arthritis  of knee, 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 OSA treated with BiPAP (Primary)  The patient does tolerate PAP and reports  benefit from PAP use, however his apnea is not controlled. Recommend bipap titration. Of note, the patient had a recent echo which shows normal LVEF. The patient was reminded how to clean equipment and advised to replace supplies routinely. The patient was also counselled on weight loss. The compliance is excellent. The AHI is 4.5 (but some nights he is having upwards of .20 apneas/hour.    OSA not optimally controlled. Bipap titration recommended. F/u after study.  2. Encounter for BiPAP use counseling Counseling: had a lengthy discussion with the patient regarding the importance of PAP therapy in management of the sleep apnea. Patient appears to understand the risk factor reduction and also understands the risks associated with untreated  sleep apnea. Patient will try to make a good faith effort to remain compliant with therapy. Also instructed the patient on proper cleaning of the device including the water must be changed daily if possible and use of distilled water is preferred. Patient understands that the machine should be regularly cleaned with appropriate recommended cleaning solutions that do not damage the PAP machine for example given white vinegar and water rinses. Other methods such as ozone treatment may not be as good as these simple methods to achieve cleaning.      General Counseling: I have discussed the findings of the evaluation and examination with Greggory Stallion.  I have also discussed any further diagnostic evaluation thatmay be needed or ordered today. Aubie verbalizes understanding of the findings of todays visit. We also reviewed his medications today and discussed drug interactions and side effects including but not limited excessive drowsiness and altered mental states. We also discussed that there is always a risk not just to him but also people around him. he has been encouraged to call the office with any questions or concerns that should arise related to todays visit.  No orders of the defined types were placed in this encounter.       I have personally obtained a history, examined the patient, evaluated laboratory and imaging results, formulated the assessment and plan and placed orders. This patient was seen today by Emmaline Kluver, PA-C in collaboration with Dr. Freda Munro.   Yevonne Pax, MD [facility] Diplomate ABMS Pulmonary Critical Care Medicine and Sleep Medicine

## 2023-06-30 NOTE — Progress Notes (Signed)
ANNUAL DIABETIC FOOT EXAM  Subjective: [name] Ferguson presents today for annual diabetic foot exam.  Patient confirms h/o diabetes.  Patient denies any h/o foot wounds.  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  Lauro Regulus, MD is patient's PCP. LOV 06/29/2023.  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   Squamous cell carcinoma of skin 03/27/2023   Posterior vertex scalp. WD SCC. 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   Abnormal TSH 06/26/2019   Acquired hypothyroidism 06/26/2019   History of cardiac arrest 04/10/2019   Stage 3a chronic kidney disease (HCC) 04/10/2019   Coagulation disorder (HCC) 03/28/2019   Cardiac arrest with ventricular fibrillation (HCC) 03/18/2019   Moderate right ventricular systolic dysfunction 03/18/2019   Pain due to onychomycosis of toenails of both  feet 01/17/2019   Diabetic neuropathy (HCC) 01/17/2019   On continuous oral anticoagulation 12/24/2018   Paroxysmal atrial fibrillation (HCC) 12/24/2018   Primary open angle glaucoma of both eyes, severe stage 12/04/2018   Pseudophakia of both eyes 12/04/2018   Vision changes 12/04/2018   Stroke (HCC) 06/26/2017   Intramuscular hematoma 04/13/2017   Acute on chronic diastolic heart failure (HCC) 10/25/2016   OSA on CPAP 10/25/2016   Chest pain, atypical 10/24/2016   Shortness of breath 10/24/2016   Bacteremia due to Gram-negative bacteria 06/07/2016   Hypokalemia 06/07/2016   Hypomagnesemia 06/07/2016   Diarrhea, unspecified 06/06/2016   Thrombocytopenia (HCC) 06/06/2016   Sepsis due to urinary tract infection (HCC) 06/05/2016   BPH with obstruction/lower urinary tract symptoms 09/22/2015   Special screening for malignant neoplasms, colon    Rectal polyp    Adiposity 03/18/2014   Heart failure (HCC) 11/07/2013   Type 2 diabetes mellitus treated with insulin (HCC) 04/08/2011   HLD (hyperlipidemia) 04/08/2011   Arthritis of knee, degenerative 04/08/2011   Diabetes (HCC) 05/15/2007   HYPERLIPIDEMIA 05/15/2007   Essential hypertension 05/15/2007   Coronary atherosclerosis 05/15/2007   DYSPNEA ON EXERTION 05/15/2007   TOBACCO ABUSE, HX OF 05/15/2007   CORONARY ARTERY BYPASS GRAFT, HX OF 05/15/2007   Past Surgical History:  Procedure Laterality Date   BACK SURGERY     CARDIAC CATHETERIZATION     COLONOSCOPY     COLONOSCOPY WITH PROPOFOL N/A 09/22/2015   Procedure: COLONOSCOPY WITH PROPOFOL;  Surgeon: Midge Minium, MD;  Location: [facility] ENDOSCOPY;  Service: Endoscopy;  Laterality: N/A;   CORONARY  ANGIOPLASTY WITH STENT PLACEMENT     CORONARY ARTERY BYPASS GRAFT  07/11/1994   HEMORRHOID SURGERY  07/11/2005   HEMORRHOID SURGERY  08/12/2015   excision internal hemorrhoid Dr Evette Cristal   JOINT REPLACEMENT Left 07/11/2002   Knee   Current Outpatient Medications on File Prior to Visit   Medication Sig Dispense Refill   acyclovir (ZOVIRAX) 400 MG tablet TK 1 T PO TID FOR 5 DAYS FOR EACH FEVER BLISTER OUTBREAK (Patient not taking: Reported on 03/27/2023)     amiodarone (PACERONE) 200 MG tablet Take 1.5 tablets by mouth daily.     Aspirin-Calcium Carbonate 81-777 MG TABS Take by mouth.     Besifloxacin HCl 0.6 % SUSP Apply to eye.     Blood Glucose Monitoring Suppl (ONETOUCH VERIO IQ SYSTEM) w/Device KIT U UTD TO CHECK BS QID     brimonidine (ALPHAGAN) 0.15 % ophthalmic solution Place 1 drop into the left eye 2 (two) times daily.     Brinzolamide-Brimonidine 1-0.2 % SUSP Apply 1 drop to eye 2 (two) times daily.     dorzolamide (TRUSOPT) 2 % ophthalmic solution Administer 2 drops to both eyes Two (2) times a day.     doxycycline (PERIOSTAT) 20 MG tablet Take 1 tablet (20 mg total) by mouth daily. Take 1 po qd with dinner meal 90 tablet 2   ELIQUIS 2.5 MG TABS tablet Take 2.5 mg by mouth 2 (two) times daily.     ezetimibe (ZETIA) 10 MG tablet Take 1 tablet by mouth daily.     FLUAD 0.5 ML SUSY TO BE ADMINISTERED BY PHARMACIST FOR IMMUNIZATION  0   fluticasone (FLONASE) 50 MCG/ACT nasal spray U 1 SPR NASALLY BID     glucagon 1 MG injection Use as directed for emergent low blood sugar,     glucose blood test strip TEST 3 TIMES DAILY     HYDROcodone-acetaminophen (NORCO/VICODIN) 5-325 MG tablet Take 1 tablet by mouth every 8 (eight) hours as needed. 10 tablet 0   insulin detemir (LEVEMIR) 100 UNIT/ML FlexPen Inject 35-40 Units into the skin at bedtime. Says 50 units is prescribed but will usually take 35-40 at night     insulin glargine (LANTUS) 100 UNIT/ML Solostar Pen Inject into the skin.     Insulin Pen Needle (PEN NEEDLES 31GX5/16") 31G X 8 MM MISC as directed     INVOKANA 100 MG TABS tablet      isosorbide mononitrate (IMDUR) 30 MG 24 hr tablet Take 30 mg by mouth daily.      KLOR-CON M20 20 MEQ tablet Take 20 mEq by mouth 2 (two) times daily.  2   latanoprost (XALATAN) 0.005  % ophthalmic solution      levothyroxine (SYNTHROID) 50 MCG tablet PLEASE SEE ATTACHED FOR DETAILED DIRECTIONS     lisinopril (ZESTRIL) 10 MG tablet Take 10 mg by mouth every morning.     metFORMIN (GLUCOPHAGE-XR) 500 MG 24 hr tablet Take 1,000 mg by mouth 2 (two) times daily.     metoprolol succinate (TOPROL-XL) 25 MG 24 hr tablet Take 1 tablet by mouth daily.     Multiple Vitamins-Minerals (MULTIVITAMIN ADULT PO) Take by mouth.     mupirocin ointment (BACTROBAN) 2 % Apply 1 Application topically daily. qd to excision site on scalp 22 g 1   nitroGLYCERIN (NITROSTAT) 0.4 MG SL tablet Place under the tongue.     Omega-3 Fatty Acids (OMEGA-3 FISH OIL PO) Take by mouth.     ONETOUCH DELICA LANCETS 33G MISC  USE 1 LANCET 3 TIMES A DAY     potassium chloride (KLOR-CON) 10 MEQ tablet Take 10 mEq by mouth daily.     pravastatin (PRAVACHOL) 40 MG tablet Take 1 tablet by mouth every evening.     prednisoLONE acetate (PRED FORTE) 1 % ophthalmic suspension PLACE 1 DROP INTO THE RIGHT EYE 6 (SIX) TIMES DAILY     REPATHA SURECLICK 140 MG/ML SOAJ Inject 1 mL into the skin every 14 (fourteen) days.     tamsulosin (FLOMAX) 0.4 MG CAPS capsule TAKE 1 CAPSULE(0.4 MG) BY MOUTH DAILY 90 capsule 3   torsemide (DEMADEX) 20 MG tablet Take by mouth.     TRADJENTA 5 MG TABS tablet      triamcinolone cream (KENALOG) 0.1 % Apply topically.     TRULICITY 0.75 MG/0.5ML SOPN Inject 0.75 mg into the skin once a week.     UNABLE TO FIND Take by mouth.     VASCEPA 0.5 g CAPS Take 4 capsules by mouth 2 (two) times daily.     vitamin B-12 (CYANOCOBALAMIN) 500 MCG tablet Take 500 mcg by mouth daily.     vitamin C (ASCORBIC ACID) 500 MG tablet Take 500 mg by mouth daily.     No current facility-administered medications on file prior to visit.    Allergies  Allergen Reactions   Statins Other (See Comments)    Suspected cause of muscle pain   Metformin Diarrhea   Social History   Occupational History   Not on file   Tobacco Use   Smoking status: Former    Current packs/day: 0.00    Types: Cigarettes    Quit date: 07/11/1981    Years since quitting: 41.9   Smokeless tobacco: Never  Substance and Sexual Activity   Alcohol use: Yes    Comment: occasionally   Drug use: No   Sexual activity: Not on file   Family History  Problem Relation Age of Onset   Diabetes Mother    Alzheimer's disease Mother    Cancer Sister    Heart disease Other        Family members in general   Diabetes Brother    Prostate cancer Neg Hx    Bladder Cancer Neg Hx    Kidney disease Neg Hx    Immunization History  Administered Date(s) Administered   Fluad Quad(high Dose 65+) 03/25/2021   Influenza, High Dose Seasonal PF 03/10/2019   Influenza-Unspecified 04/13/2016, 04/14/2017, 07/12/2019   PFIZER Comirnaty(Gray Top)Covid-19 Tri-Sucrose Vaccine 07/30/2019, 08/19/2019, 03/18/2020   PFIZER(Purple Top)SARS-COV-2 Vaccination 07/30/2019, 08/19/2019   Pneumococcal Conjugate-13 03/08/2017, 04/23/2020   Tdap 03/08/2011   Zoster Recombinant(Shingrix) 04/18/2017, 09/04/2017     Review of Systems: Negative except as noted in the HPI.   Objective: There were no vitals filed for this visit.  [name] Ferguson is a pleasant 82 y.o. male in NAD. AAO X 3.  Diabetic foot exam was performed with the following findings:   Intact posterior tibialis and dorsalis pedis pulses Pt has subjective symptoms of neuropathy.  Pedal integument with normal turgor, texture and tone BLE. No open wounds b/l LE. No interdigital macerations noted b/l LE. Toenails 1-5 b/l elongated, discolored, dystrophic, thickened, crumbly with subungual debris and tenderness to dorsal palpation. No corns, calluses nor porokeratotic lesions noted. 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   Muscle strength 5/5 to all lower extremity muscle groups bilaterally. No pain, crepitus or joint limitation noted with ROM bilateral LE. Hammertoe  deformity noted 2-5  b/l. Utilizes cane for ambulation assistance.       ADA Risk Categorization: Low Risk :  Patient has all of the following: Intact protective sensation No prior foot ulcer  No severe deformity Pedal pulses present  Assessment: 1. Pain due to onychomycosis of toenails of both feet   2. Hammer toes of both feet   3. Diabetic polyneuropathy associated with type 2 diabetes mellitus (HCC)   4. Encounter for diabetic foot exam (HCC)     Plan: -Consent given for treatment as described below: -Examined patient. -Diabetic foot examination performed today. -Continue diabetic foot care principles: inspect feet daily, monitor glucose as recommended by PCP and/or Endocrinologist, and follow prescribed diet per PCP, Endocrinologist and/or dietician. -Patient to continue soft, supportive shoe gear daily. -Toenails 1-5 b/l were debrided in length and girth with sterile nail nippers and dremel without iatrogenic bleeding.  -Patient/POA to call should there be question/concern in the interim. Return in about 10 weeks (around 09/08/2023).  Freddie Breech, DPM      [facility] LOCATION: [address], [place] [address]                   Office [phone]   [facility] LOCATION: [address] [place], [place] [address] Office [phone]

## 2023-07-03 ENCOUNTER — Ambulatory Visit (INDEPENDENT_AMBULATORY_CARE_PROVIDER_SITE_OTHER): Payer: Medicare Other | Admitting: Internal Medicine

## 2023-07-03 VITALS — BP 125/77 | HR 70 | Resp 18 | Ht 66.0 in | Wt 205.0 lb

## 2023-07-03 DIAGNOSIS — G4733 Obstructive sleep apnea (adult) (pediatric): Secondary | ICD-10-CM | POA: Diagnosis not present

## 2023-07-03 DIAGNOSIS — Z7189 Other specified counseling: Secondary | ICD-10-CM | POA: Diagnosis not present

## 2023-07-03 NOTE — Patient Instructions (Signed)

## 2023-07-24 ENCOUNTER — Other Ambulatory Visit: Payer: Self-pay | Admitting: Ophthalmology

## 2023-07-24 DIAGNOSIS — G453 Amaurosis fugax: Secondary | ICD-10-CM

## 2023-07-28 ENCOUNTER — Ambulatory Visit
Admission: RE | Admit: 2023-07-28 | Discharge: 2023-07-28 | Disposition: A | Discharge status: Home / Self Care | Payer: Medicare Other | Source: Ambulatory Visit | Attending: Ophthalmology | Admitting: Ophthalmology

## 2023-07-28 DIAGNOSIS — G453 Amaurosis fugax: Secondary | ICD-10-CM | POA: Diagnosis present

## 2023-08-03 ENCOUNTER — Encounter (INDEPENDENT_AMBULATORY_CARE_PROVIDER_SITE_OTHER): Payer: Self-pay

## 2023-08-23 ENCOUNTER — Other Ambulatory Visit: Payer: Self-pay | Admitting: Dermatology

## 2023-08-23 DIAGNOSIS — L719 Rosacea, unspecified: Secondary | ICD-10-CM

## 2023-08-28 ENCOUNTER — Ambulatory Visit (INDEPENDENT_AMBULATORY_CARE_PROVIDER_SITE_OTHER): Payer: Medicare Other | Admitting: Dermatology

## 2023-08-28 ENCOUNTER — Encounter: Payer: Self-pay | Admitting: Dermatology

## 2023-08-28 DIAGNOSIS — L578 Other skin changes due to chronic exposure to nonionizing radiation: Secondary | ICD-10-CM

## 2023-08-28 DIAGNOSIS — L570 Actinic keratosis: Secondary | ICD-10-CM | POA: Diagnosis not present

## 2023-08-28 DIAGNOSIS — Z5111 Encounter for antineoplastic chemotherapy: Secondary | ICD-10-CM

## 2023-08-28 DIAGNOSIS — W908XXA Exposure to other nonionizing radiation, initial encounter: Secondary | ICD-10-CM | POA: Diagnosis not present

## 2023-08-28 DIAGNOSIS — D1801 Hemangioma of skin and subcutaneous tissue: Secondary | ICD-10-CM

## 2023-08-28 DIAGNOSIS — Z872 Personal history of diseases of the skin and subcutaneous tissue: Secondary | ICD-10-CM

## 2023-08-28 DIAGNOSIS — L729 Follicular cyst of the skin and subcutaneous tissue, unspecified: Secondary | ICD-10-CM

## 2023-08-28 DIAGNOSIS — D229 Melanocytic nevi, unspecified: Secondary | ICD-10-CM

## 2023-08-28 DIAGNOSIS — L814 Other melanin hyperpigmentation: Secondary | ICD-10-CM | POA: Diagnosis not present

## 2023-08-28 DIAGNOSIS — Z8589 Personal history of malignant neoplasm of other organs and systems: Secondary | ICD-10-CM

## 2023-08-28 DIAGNOSIS — L821 Other seborrheic keratosis: Secondary | ICD-10-CM

## 2023-08-28 DIAGNOSIS — D692 Other nonthrombocytopenic purpura: Secondary | ICD-10-CM

## 2023-08-28 DIAGNOSIS — Z85828 Personal history of other malignant neoplasm of skin: Secondary | ICD-10-CM

## 2023-08-28 DIAGNOSIS — Z7189 Other specified counseling: Secondary | ICD-10-CM

## 2023-08-28 DIAGNOSIS — Z1283 Encounter for screening for malignant neoplasm of skin: Secondary | ICD-10-CM

## 2023-08-28 DIAGNOSIS — L720 Epidermal cyst: Secondary | ICD-10-CM

## 2023-08-28 DIAGNOSIS — Z79899 Other long term (current) drug therapy: Secondary | ICD-10-CM

## 2023-08-28 MED ORDER — FLUOROURACIL 5 % EX CREA: TOPICAL_CREAM | CUTANEOUS | 2 refills | Status: DC

## 2023-08-28 NOTE — Patient Instructions (Addendum)
Cryotherapy Aftercare  Wash gently with soap and water everyday.   Apply Vaseline Jelly daily until healed.    Begin in 2 weeks Apply 5-fluorouracil/calcipotriene cream twice a day for 7 days to affected areas including upper forehead and frontal scalp. Prescription sent to [facility]. Patient advised they will receive an email to purchase the medication online and have it sent to their home. Patient provided with handout reviewing treatment course and side effects and advised to call or message [place] on MyChart with any concerns.  5-fluorouracil/calcipotriene cream is is a type of field treatment used to treat precancers, thin skin cancers, and areas of sun damage. Expected reaction includes irritation and mild inflammation potentially progressing to more severe inflammation including redness, scaling, crusting and open sores/erosions.  If too much irritation occurs, ensure application of only a thin layer and decrease frequency of use to achieve a tolerable level of inflammation. Recommend applying Vaseline ointment to open sores as needed.  Minimize sun exposure while under treatment. Recommend daily broad spectrum sunscreen SPF 30+ to sun-exposed areas, reapply every 2 hours as needed.    Instructions for [facility] Medications  One or more of your medications was sent to the [facility] mail order compound[facility] pharmacy. You will receive an email from them and can purchase the medicine through that link. It will then be mailed to your home at the address you confirmed. If for any reason you do not receive an email from them, please check your spam folder. If you still do not find the email, please let us know. [facility] phone number is [phone].     Recommend daily broad spectrum sunscreen SPF 30+ to sun-exposed areas, reapply every 2 hours as needed. Call for new or changing lesions.  Staying in the shade or wearing long sleeves, sun glasses (UVA+UVB  protection) and wide brim hats (4-inch brim around the entire circumference of the hat) are also recommended for sun protection.      Due to recent changes in healthcare laws, you may see results of your pathology and/or laboratory studies on MyChart before the doctors have had a chance to review them. We understand that in some cases there may be results that are confusing or concerning to you. Please understand that not all results are received at the same time and often the doctors may need to interpret multiple results in order to provide you with the best plan of care or course of treatment. Therefore, we ask that you please give [place] 2 business days to thoroughly review all your results before contacting the office for clarification. Should we see a critical lab result, you will be contacted sooner.   If You Need Anything After Your Visit  If you have any questions or concerns for your doctor, please call our main line at [phone] and press option 4 to reach your doctor's medical assistant. If no one answers, please leave a voicemail as directed and we will return your call as soon as possible. Messages left after 4 pm will be answered the following business day.   You may also send [place] a message via MyChart. We typically respond to MyChart messages within 1-2 business days.  For prescription refills, please ask your pharmacy to contact our office. Our fax number is [phone].  If you have an urgent issue when the clinic is closed that cannot wait until the next business day, you can page your doctor at the number below.    Please  note that while we do our best to be available for urgent issues outside of office hours, we are not available 24/7.   If you have an urgent issue and are unable to reach [place], you may choose to seek medical care at your doctor's office, retail clinic, urgent care center, or emergency room.  If you have a medical emergency, please immediately call 911 or go to  the emergency department.  Pager Numbers  - Dr. Gwen Pounds: [phone]  - Dr. Roseanne Reno: [phone]  - Dr. Katrinka Blazing: [phone]   In the event of inclement weather, please call our main line at [phone] for an update on the status of any delays or closures.  Dermatology Medication Tips: Please keep the boxes that topical medications come in in order to help keep track of the instructions about where and how to use these. Pharmacies typically print the medication instructions only on the boxes and not directly on the medication tubes.   If your medication is too expensive, please contact our office at [phone] option 4 or send [place] a message through MyChart.   We are unable to tell what your co-pay for medications will be in advance as this is different depending on your insurance coverage. However, we may be able to find a substitute medication at lower cost or fill out paperwork to get insurance to cover a needed medication.   If a prior authorization is required to get your medication covered by your insurance company, please allow [place] 1-2 business days to complete this process.  Drug prices often vary depending on where the prescription is filled and some pharmacies may offer cheaper prices.  The website www.goodrx.com contains coupons for medications through different pharmacies. The prices here do not account for what the cost may be with help from insurance (it may be cheaper with your insurance), but the website can give you the price if you did not use any insurance.  - You can print the associated coupon and take it with your prescription to the pharmacy.  - You may also stop by our office during regular business hours and pick up a GoodRx coupon card.  - If you need your prescription sent electronically to a different pharmacy, notify our office through [facility] or by phone at [phone] option 4.     Si Usted Necesita Algo Despus de Su Visita  Tambin  puede enviarnos un mensaje a travs de Clinical cytogeneticist. Por lo general respondemos a los mensajes de MyChart en el transcurso de 1 a 2 das hbiles.  Para renovar recetas, por favor pida a su farmacia que se ponga en contacto con nuestra oficina. Annie Sable de fax es [place] [phone].  Si tiene un asunto urgente cuando la clnica est cerrada y que no puede esperar hasta el siguiente da hbil, puede llamar/localizar a su doctor(a) al nmero que aparece a continuacin.   Por favor, tenga en cuenta que aunque hacemos todo lo posible para estar disponibles para asuntos urgentes fuera del horario de [place], no estamos disponibles las 24 horas del da, los 7 [address]  [address] de la [place].   Si tiene un problema urgente y no puede comunicarse con nosotros, puede optar por buscar atencin mdica  en el consultorio de su doctor(a), en una clnica privada, en un centro de atencin urgente o en una sala de emergencias.  Si tiene Engineer, drilling, por favor llame inmediatamente al 911 o vaya a la sala de emergencias.  Nmeros de bper  - Dr. Gwen Pounds:  [phone]  - Dra. Roseanne Reno: [phone]  - Dr. Katrinka Blazing: [phone]   En caso de inclemencias del tiempo, por favor llame a Lacy Duverney principal al [phone] para una actualizacin sobre el [place] de cualquier retraso o cierre.  Consejos para la medicacin en dermatologa: Por favor, guarde las cajas en las que vienen los medicamentos de uso tpico para ayudarle a seguir las instrucciones sobre dnde y cmo usarlos. Las farmacias generalmente imprimen las instrucciones del medicamento slo en las cajas y no directamente en los tubos del [employer].   Si su medicamento es muy caro, por favor, pngase en contacto con Rolm [place]ala llamando al [phone] y presione la opcin 4 o envenos un mensaje a travs de Clinical cytogeneticist.   No podemos decirle cul ser su copago por los medicamentos por adelantado ya que esto es diferente dependiendo de la cobertura de su  seguro. Sin embargo, es posible que podamos encontrar un medicamento sustituto a Audiological scientist un formulario para que el seguro cubra el medicamento que se considera necesario.   Si se requiere una autorizacin previa para que su compaa de seguros [place] su medicamento, por favor permtanos de 1 a 2 das hbiles para completar [address]  Los precios de los medicamentos varan con frecuencia dependiendo del Environmental consultant de dnde se surte la receta y alguna farmacias pueden ofrecer precios ms baratos.  El sitio web www.goodrx.com tiene cupones para medicamentos de Health and safety inspector. Los precios aqu no tienen en cuenta lo que podra costar con la ayuda del seguro (puede ser ms barato con su seguro), pero el sitio web puede darle el precio si no utiliz Tourist information centre manager.  - Puede imprimir el cupn correspondiente y llevarlo con su receta a la farmacia.  - Tambin puede pasar por nuestra oficina durante el horario de atencin regular y Education officer, museum una tarjeta de cupones de GoodRx.  - Si necesita que su receta se enve electrnicamente a una farmacia diferente, informe a nuestra oficina a travs de MyChart de [place] o por telfono llamando al [phone] y presione la opcin 4.

## 2023-08-28 NOTE — Progress Notes (Unsigned)
Follow-Up Visit   Subjective  [name] Ferguson is a 83 y.o. male who presents for the following: Skin Cancer Screening and Upper Body Skin Exam. Hx of SCC's. Hx of AKs.  Spot of concern at left temple. Raised. Non tender, denies itching.   8 month AK follow up. Face and scalp. Tx with LN2.   The patient presents for Upper Body Skin Exam (UBSE) for skin cancer screening and mole check. The patient has spots, moles and lesions to be evaluated, some may be new or changing and the patient may have concern these could be cancer.  The following portions of the chart were reviewed this encounter and updated as appropriate: medications, allergies, medical history  Review of Systems:  No other skin or systemic complaints except as noted in HPI or Assessment and Plan.  Objective  Well appearing patient in no apparent distress; mood and affect are within normal limits.  All skin waist up examined. Relevant physical exam findings are noted in the Assessment and Plan.  superior forehead and anterior scalp x18 (18) Erythematous thin papules/macules with gritty scale.   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 They are chronic with expected duration over 1 year. A portion of actinic keratoses will progress to squamous cell carcinoma of the skin. It is not possible to reliably predict which spots will progress to skin cancer and so treatment is recommended to prevent development of skin cancer.  Recommend daily broad spectrum sunscreen SPF 30+ to sun-exposed areas, reapply every 2 hours as needed.  Recommend staying in the shade or wearing long sleeves, sun glasses (UVA+UVB protection) and wide brim hats (4-inch brim around the entire circumference of the hat). Call for new or changing lesions. Destruction of lesion -  superior forehead and anterior scalp x18 (18) Complexity: simple   Destruction method: cryotherapy   Informed consent: discussed and consent obtained   Timeout:  patient name, date of birth, surgical site, and procedure verified Lesion destroyed using liquid nitrogen: Yes   Region frozen until ice ball extended beyond lesion: Yes   Outcome: patient tolerated procedure well with no complications   Post-procedure details: wound care instructions given   Additional details:  Prior to procedure, discussed risks of blister formation, small wound, skin dyspigmentation, or rare scar following cryotherapy. Recommend Vaseline ointment to treated areas while healing.    Skin cancer screening performed today.  HISTORY OF SQUAMOUS CELL CARCINOMA OF THE SKIN. Most recent at posterior vertex scalp. Excised 04/11/2023.  - No evidence of recurrence today - No lymphadenopathy - Recommend regular full body skin exams - Recommend daily broad spectrum sunscreen SPF 30+ to sun-exposed areas, reapply every 2 hours as needed.  - Call if any new or changing lesions are noted between office visits  Actinic Damage - Chronic condition, secondary to cumulative UV/sun exposure - diffuse scaly erythematous macules with underlying dyspigmentation - Recommend daily broad spectrum sunscreen SPF 30+ to sun-exposed areas, reapply every 2 hours as needed.  - Staying in the shade or wearing long sleeves, sun glasses (UVA+UVB protection) and wide brim hats (4-inch brim around the entire circumference of the hat) are also recommended for sun protection.  - Call for new or changing lesions.  Lentigines, Seborrheic Keratoses, Hemangiomas - Benign normal skin lesions - Benign-appearing - Call for any changes  Melanocytic Nevi - Tan-brown  and/or pink-flesh-colored symmetric macules and papules - Benign appearing on exam today - Observation - Call clinic for new or changing moles - Recommend daily use of broad spectrum spf  30+ sunscreen to sun-exposed areas.   ACTINIC DAMAGE WITH PRECANCEROUS ACTINIC KERATOSES Counseling for Topical Chemotherapy Management: Patient exhibits: - Severe, confluent actinic changes with pre-cancerous actinic keratoses that is secondary to cumulative UV radiation exposure over time - Condition that is severe; chronic, not at goal. - diffuse scaly erythematous macules and papules with underlying dyspigmentation - Discussed Prescription "Field Treatment" topical Chemotherapy for Severe, Chronic Confluent Actinic Changes with Pre-Cancerous Actinic Keratoses Field treatment involves treatment of an entire area of skin that has confluent Actinic Changes (Sun/ Ultraviolet light damage) and PreCancerous Actinic Keratoses by method of PhotoDynamic Therapy (PDT) and/or prescription Topical Chemotherapy agents such as 5-fluorouracil, 5-fluorouracil/calcipotriene, and/or imiquimod.  The purpose is to decrease the number of clinically evident and subclinical PreCancerous lesions to prevent progression to development of skin cancer by chemically destroying early precancer changes that may or may not be visible.  It has been shown to reduce the risk of developing skin cancer in the treated area. As a result of treatment, redness, scaling, crusting, and open sores may occur during treatment course. One or more than one of these methods may be used and may have to be used several times to control, suppress and eliminate the PreCancerous changes. Discussed treatment course, expected reaction, and possible side effects. - Recommend daily broad spectrum sunscreen SPF 30+ to sun-exposed areas, reapply every 2 hours as needed.  - Staying in the shade or wearing long sleeves, sun glasses (UVA+UVB protection) and wide brim hats (4-inch brim around the entire circumference of the hat) are also recommended. - Call for new or changing lesions.  Begin in 2 weeks Apply 5-fluorouracil/calcipotriene cream twice a day for 7  days to affected areas including upper forehead and frontal scalp. Prescription sent to [facility]. Patient advised they will receive an email to purchase the medication online and have it sent to their home. Patient provided with handout reviewing treatment course and side effects and advised to call or message [place] on MyChart with any concerns.  5-fluorouracil/calcipotriene cream is is a type of field treatment used to treat precancers, thin skin cancers, and areas of sun damage. Expected reaction includes irritation and mild inflammation potentially progressing to more severe inflammation including redness, scaling, crusting and open sores/erosions.  If too much irritation occurs, ensure application of only a thin layer and decrease frequency of use to achieve a tolerable level of inflammation. Recommend applying Vaseline ointment to open sores as needed.  Minimize sun exposure while under treatment. Recommend daily broad spectrum sunscreen SPF 30+ to sun-exposed areas, reapply every 2 hours as needed.   EPIDERMAL INCLUSION CYST Exam: Subcutaneous nodule at left temple. 1.1 cm Benign-appearing. Exam most consistent with an epidermal inclusion cyst. Discussed that a cyst is a benign growth that can grow over time and sometimes get irritated or inflamed. Recommend observation if it is not bothersome. Discussed option of surgical excision to remove it if it is growing, symptomatic, or other changes noted. Please call for new or changing lesions so they can be evaluated.  Purpura - Chronic; persistent and recurrent.  Treatable, but not curable. - Violaceous macules and patches - Benign - Related to trauma, age, sun damage and/or use of blood thinners, chronic use of topical and/or oral steroids - Observe - Can use OTC arnica containing moisturizer  such as Dermend Bruise Formula if desired - Call for worsening or other concerns   Return in about 6 months (around 02/25/2024) for  AK Follow Up.  I, Lawson Radar, CMA, am acting as scribe for Armida Sans, MD.   Documentation: I have reviewed the above documentation for accuracy and completeness, and I agree with the above.  Armida Sans, MD

## 2023-08-28 NOTE — Progress Notes (Deleted)
   Follow-Up Visit   Subjective  [name] Ferguson is a 83 y.o. male who presents for the following: 8 month AK follow up.   Spot of concern on left temple. Feels raised. Non tender. Denies itching  The patient has spots, moles and lesions to be evaluated, some may be new or changing and the patient may have concern these could be cancer.   The following portions of the chart were reviewed this encounter and updated as appropriate: medications, allergies, medical history  Review of Systems:  No other skin or systemic complaints except as noted in HPI or Assessment and Plan.  Objective  Well appearing patient in no apparent distress; mood and affect are within normal limits.  ***A full examination was performed including scalp, head, eyes, ears, nose, lips, neck, chest, axillae, abdomen, back, buttocks, bilateral upper extremities, bilateral lower extremities, hands, feet, fingers, toes, fingernails, and toenails. All findings within normal limits unless otherwise noted below.  ***A focused examination was performed of the following areas: ***  Relevant exam findings are noted in the Assessment and Plan.    Assessment & Plan       No follow-ups on file.  ***  Documentation: I have reviewed the above documentation for accuracy and completeness, and I agree with the above.  Armida Sans, MD

## 2023-08-29 ENCOUNTER — Encounter: Payer: Self-pay | Admitting: Dermatology

## 2023-08-29 ENCOUNTER — Ambulatory Visit: Payer: Medicare Other | Admitting: Dermatology

## 2023-09-08 ENCOUNTER — Ambulatory Visit (INDEPENDENT_AMBULATORY_CARE_PROVIDER_SITE_OTHER): Payer: Medicare Other | Admitting: Podiatry

## 2023-09-08 ENCOUNTER — Encounter: Payer: Self-pay | Admitting: Podiatry

## 2023-09-08 DIAGNOSIS — E1142 Type 2 diabetes mellitus with diabetic polyneuropathy: Secondary | ICD-10-CM

## 2023-09-08 DIAGNOSIS — B351 Tinea unguium: Secondary | ICD-10-CM | POA: Diagnosis not present

## 2023-09-08 DIAGNOSIS — M79675 Pain in left toe(s): Secondary | ICD-10-CM | POA: Diagnosis not present

## 2023-09-08 DIAGNOSIS — M79674 Pain in right toe(s): Secondary | ICD-10-CM | POA: Diagnosis not present

## 2023-09-08 NOTE — Progress Notes (Signed)
  Subjective:  Patient ID: [name] Ferguson, male    DOB: 06/13/1941,  MRN: [id number]  83 y.o. male presents at risk foot care with history of diabetic neuropathy and painful mycotic toenails x 10 which interfere with daily activities. Pain is relieved with periodic professional debridement. They will be traveling to [place] to see his grandson in the [employer]. Chief Complaint  Patient presents with   Diabetes    "Take care of these toenails so they can stop snagging my socks."   New problem(s): None   PCP is Lauro Regulus, MD.  Allergies  Allergen Reactions   Statins Other (See Comments)    Suspected cause of muscle pain   Metformin Diarrhea    Review of Systems: Negative except as noted in the HPI.   Objective:  [name] Ferguson is a pleasant 83 y.o. male WD, WN in NAD. AAO x 3.  Vascular Examination: Vascular status intact b/l with palpable pedal pulses. CFT immediate b/l. Pedal hair present. No edema. No pain with calf compression b/l. Skin temperature gradient WNL b/l. No varicosities noted. No cyanosis or clubbing noted.  Neurological Examination: Pt has subjective symptoms of neuropathy. Sensation grossly intact b/l with 10 gram monofilament. Vibratory sensation intact b/l.  Dermatological Examination: Pedal skin with normal turgor, texture and tone b/l. No open wounds nor interdigital macerations noted. Subungual ecchymosis left great toe from old injury. No erythema, no edema, no drainage, no fluctuance. Nail plate remains adhered. Toenails 1-5 b/l thick, discolored, elongated with subungual debris and pain on dorsal palpation. No hyperkeratotic lesions noted b/l.   Musculoskeletal Examination: Muscle strength 5/5 to b/l LE.  No pain, crepitus noted b/l. Hammertoe deformity noted 2-5 b/l.  Radiographs: None  Last A1c:       No data to display           Assessment:   1. Pain due to onychomycosis of toenails of both feet   2. Diabetic polyneuropathy associated with  type 2 diabetes mellitus (HCC)    Plan:  Patient was evaluated and treated. All patient's and/or POA's questions/concerns addressed on today's visit. Mycotic toenails 1-5 debrided in length and girth without incident.  Monitor left great toe for any changes. Continue daily foot inspections and monitor blood glucose per PCP/Endocrinologist's recommendations.Continue soft, supportive shoe gear daily. Report any pedal injuries to medical professional. Call office if there are any quesitons/concerns. -Patient/POA to call should there be question/concern in the interim.  Return in about 10 weeks (around 11/17/2023).  Freddie Breech, DPM      [place] LOCATION: [address] [place] [address]                   Office [phone]   [facility] LOCATION: [address] [place], [place] [address] Office [phone]

## 2023-10-05 ENCOUNTER — Encounter (HOSPITAL_COMMUNITY): Payer: Self-pay | Admitting: Emergency Medicine

## 2023-10-05 ENCOUNTER — Emergency Department (HOSPITAL_COMMUNITY)
Admission: EM | Admit: 2023-10-05 | Discharge: 2023-10-05 | Disposition: A | Discharge status: Home / Self Care | Attending: Emergency Medicine | Admitting: Emergency Medicine

## 2023-10-05 ENCOUNTER — Emergency Department (HOSPITAL_COMMUNITY)

## 2023-10-05 ENCOUNTER — Other Ambulatory Visit: Payer: Self-pay

## 2023-10-05 DIAGNOSIS — Z7902 Long term (current) use of antithrombotics/antiplatelets: Secondary | ICD-10-CM | POA: Diagnosis not present

## 2023-10-05 DIAGNOSIS — I2510 Atherosclerotic heart disease of native coronary artery without angina pectoris: Secondary | ICD-10-CM | POA: Insufficient documentation

## 2023-10-05 DIAGNOSIS — Z794 Long term (current) use of insulin: Secondary | ICD-10-CM | POA: Diagnosis not present

## 2023-10-05 DIAGNOSIS — Z7901 Long term (current) use of anticoagulants: Secondary | ICD-10-CM | POA: Insufficient documentation

## 2023-10-05 DIAGNOSIS — I509 Heart failure, unspecified: Secondary | ICD-10-CM | POA: Diagnosis not present

## 2023-10-05 DIAGNOSIS — Z7982 Long term (current) use of aspirin: Secondary | ICD-10-CM | POA: Insufficient documentation

## 2023-10-05 DIAGNOSIS — S0990XA Unspecified injury of head, initial encounter: Secondary | ICD-10-CM | POA: Diagnosis present

## 2023-10-05 DIAGNOSIS — Z8673 Personal history of transient ischemic attack (TIA), and cerebral infarction without residual deficits: Secondary | ICD-10-CM | POA: Insufficient documentation

## 2023-10-05 DIAGNOSIS — S0101XA Laceration without foreign body of scalp, initial encounter: Secondary | ICD-10-CM | POA: Insufficient documentation

## 2023-10-05 DIAGNOSIS — W01198A Fall on same level from slipping, tripping and stumbling with subsequent striking against other object, initial encounter: Secondary | ICD-10-CM | POA: Diagnosis not present

## 2023-10-05 LAB — CBC WITH DIFFERENTIAL/PLATELET
Abs Immature Granulocytes: 0.02 10*3/uL (ref 0.00–0.07)
Basophils Absolute: 0 10*3/uL (ref 0.0–0.1)
Basophils Relative: 1 %
Eosinophils Absolute: 0 10*3/uL (ref 0.0–0.5)
Eosinophils Relative: 0 %
HCT: 53.1 % — ABNORMAL HIGH (ref 39.0–52.0)
Hemoglobin: 17.9 g/dL — ABNORMAL HIGH (ref 13.0–17.0)
Immature Granulocytes: 0 %
Lymphocytes Relative: 15 %
Lymphs Abs: 0.9 10*3/uL (ref 0.7–4.0)
MCH: 31.6 pg (ref 26.0–34.0)
MCHC: 33.7 g/dL (ref 30.0–36.0)
MCV: 93.8 fL (ref 80.0–100.0)
Monocytes Absolute: 0.6 10*3/uL (ref 0.1–1.0)
Monocytes Relative: 10 %
Neutro Abs: 4.3 10*3/uL (ref 1.7–7.7)
Neutrophils Relative %: 74 %
Platelets: 171 10*3/uL (ref 150–400)
RBC: 5.66 MIL/uL (ref 4.22–5.81)
RDW: 13.2 % (ref 11.5–15.5)
WBC: 5.8 10*3/uL (ref 4.0–10.5)
nRBC: 0 % (ref 0.0–0.2)

## 2023-10-05 LAB — PROTIME-INR
INR: 1.2 (ref 0.8–1.2)
Prothrombin Time: 15.7 s — ABNORMAL HIGH (ref 11.4–15.2)

## 2023-10-05 LAB — BASIC METABOLIC PANEL WITH GFR
Anion gap: 9 (ref 5–15)
BUN: 28 mg/dL — ABNORMAL HIGH (ref 8–23)
CO2: 28 mmol/L (ref 22–32)
Calcium: 9 mg/dL (ref 8.9–10.3)
Chloride: 101 mmol/L (ref 98–111)
Creatinine, Ser: 1.92 mg/dL — ABNORMAL HIGH (ref 0.61–1.24)
GFR, Estimated: 35 mL/min — ABNORMAL LOW (ref >60)
Glucose, Bld: 112 mg/dL — ABNORMAL HIGH (ref 70–99)
Potassium: 3.8 mmol/L (ref 3.5–5.1)
Sodium: 138 mmol/L (ref 135–145)

## 2023-10-05 NOTE — Progress Notes (Signed)
 Orthopedic Tech Progress Note Patient Details:  [name] Ferguson August 10, 1940 [id number] Level 2 Trauma. Not needed at the moment Patient ID: [name] Ferguson, male   DOB: September 14, 1940, 83 y.o.   MRN: [id number]  [name] 10/05/2023, 12:48 PM

## 2023-10-05 NOTE — ED Provider Notes (Signed)
 [place] [facility]ERGENC[facility] DEPARTMENT AT [facility] Provider Note   CSN: [id number] Arrival date & time: 10/05/23  1239     History  Chief Complaint  Patient presents with   Fall    [name]    FAYETTE Ferguson is a 83 y.o. male.  HPI     83 year old male comes in with chief complaint of mechanical fall. Patient has history of CHF, CAD, stroke, A-fib on Eliquis.  His last Eliquis dose was earlier this morning.  Patient has some balance issues.  He was walking up the stairs with grocery bags, when he started noticing falling backwards.  Patient fell onto concrete surface and struck his head.  He started having bleeding.  He denies any loss of consciousness, one-sided weakness, numbness, slurred speech, vision change.  He was able to ambulate.  He has a large hematoma to the back of his head along with some bleeding, therefore EMS was called.  Patient also denies any chest pain, shortness of breath, severe back pain.  Home Medications Prior to Admission medications   Medication Sig Start Date End Date Taking? Authorizing Provider  acyclovir (ZOVIRAX) 400 MG tablet TK 1 T PO TID FOR 5 DAYS FOR EACH FEVER BLISTER OUTBREAK Patient not taking: Reported on 09/08/2023 01/24/19   [provider]  amiodarone (PACERONE) 200 MG tablet Take 1.5 tablets by mouth daily. 02/24/20   [provider]  Aspirin-Calcium Carbonate 81-777 MG TABS Take by mouth.    [provider]  Besifloxacin HCl 0.6 % SUSP Apply to eye. 09/29/17   [provider]  Blood Glucose Monitoring Suppl (ONETOUCH VERIO IQ SYSTEM) w/Device KIT U UTD TO CHECK BS QID 01/18/17   [provider]  brimonidine (ALPHAGAN) 0.15 % ophthalmic solution Place 1 drop into the left eye 2 (two) times daily. 10/05/19   [provider]  Brinzolamide-Brimonidine 1-0.2 % SUSP Apply 1 drop to eye 2 (two) times daily.    [provider]  clopidogrel (PLAVIX) 75 MG tablet Take by mouth.  06/28/23 06/27/24  [provider]  dorzolamide (TRUSOPT) 2 % ophthalmic solution Administer 2 drops to both eyes Two (2) times a day. 02/27/15   [provider]  doxycycline (PERIOSTAT) 20 MG tablet TAKE 1 TABLET(20 MG) BY MOUTH EVERY DAY WITH DINNER 08/24/23   Deirdre Evener, MD  ELIQUIS 2.5 MG TABS tablet Take 2.5 mg by mouth 2 (two) times daily.    [provider]  ezetimibe (ZETIA) 10 MG tablet Take 1 tablet by mouth daily. 01/14/20   [provider]  FLUAD 0.5 ML SUSY TO BE ADMINISTERED BY PHARMACIST FOR IMMUNIZATION 03/06/18   [provider]  fluorouracil (EFUDEX) 5 % cream Apply bid for 7 days to upper forehead and frontal scalp 08/28/23   Deirdre Evener, MD  fluticasone [facility]) 50 MCG/ACT nasal spray U 1 SPR NASALLY BID 08/12/16   [provider]  glucagon 1 MG injection Use as directed for emergent low blood sugar, 06/26/19   [provider]  glucose blood test strip TEST 3 TIMES DAILY 01/27/18   [provider]  HYDROcodone-acetaminophen (NORCO/VICODIN) 5-325 MG tablet Take 1 tablet by mouth every 8 (eight) hours as needed. 05/16/20   Tommie Sams, DO  insulin detemir (LEVEMIR) 100 UNIT/ML FlexPen Inject 35-40 Units into the skin at bedtime. Says 50 units is prescribed but will usually take 35-40 at night    [provider]  insulin glargine (LANTUS) 100 UNIT/ML Solostar  Pen Inject into the skin.    [provider]  Insulin Pen Needle (PEN NEEDLES 31GX5/16") 31G X 8 MM MISC as directed 11/24/10   [provider]  INVOKANA 100 MG TABS tablet  03/27/19   [provider]  isosorbide mononitrate (IMDUR) 30 MG 24 hr tablet Take 30 mg by mouth daily.  06/10/13   [provider]  JARDIANCE 10 MG TABS tablet Take by mouth. 03/20/23   [provider]  KLOR-CON M20 20 MEQ tablet Take 20 mEq by mouth 2 (two) times daily. 11/16/17   [provider]  latanoprost (XALATAN)  0.005 % ophthalmic solution  01/06/18   [provider]  levothyroxine (SYNTHROID) 50 MCG tablet PLEASE SEE ATTACHED FOR DETAILED DIRECTIONS 08/22/19   [provider]  metFORMIN (GLUCOPHAGE-XR) 500 MG 24 hr tablet Take 1,000 mg by mouth 2 (two) times daily. Patient not taking: Reported on 09/08/2023 04/21/21   [provider]  metoprolol succinate (TOPROL-XL) 25 MG 24 hr tablet Take 1 tablet by mouth daily. 06/16/21   [provider]  Multiple Vitamins-Minerals (MULTIVITAMIN ADULT PO) Take by mouth.    [provider]  mupirocin ointment (BACTROBAN) 2 % Apply 1 Application topically daily. qd to excision site on scalp 04/11/23   Deirdre Evener, MD  nitroGLYCERIN (NITROSTAT) 0.4 MG SL tablet Place under the tongue. 08/21/20   [provider]  Omega-3 Fatty Acids (OMEGA-3 FISH OIL PO) Take by mouth.    [provider]  [facility] DELICA LANCETS 33G MISC USE 1 LANCET 3 TIMES A DAY 02/10/18   [provider]  potassium chloride (KLOR-CON) 10 MEQ tablet Take 10 mEq by mouth daily. 06/16/21   [provider]  pravastatin (PRAVACHOL) 40 MG tablet Take 1 tablet by mouth every evening. 08/12/20   [provider]  prednisoLONE acetate (PRED FORTE) 1 % ophthalmic suspension PLACE 1 DROP INTO THE RIGHT EYE 6 (SIX) TIMES DAILY 02/18/19   [provider]  REPATHA SURECLICK 140 MG/ML SOAJ Inject 1 mL into the skin every 14 (fourteen) days. 02/04/20   [provider]  tamsulosin (FLOMAX) 0.4 MG CAPS capsule TAKE 1 CAPSULE(0.4 MG) BY MOUTH DAILY 02/03/23   Marvel Plan, Carollee Herter A, PA-C  torsemide (DEMADEX) 20 MG tablet Take by mouth. 02/04/20 02/03/21  [provider]  TRADJENTA 5 MG TABS tablet  12/30/17   [provider]  triamcinolone cream (KENALOG) 0.1 % Apply topically. 09/22/16   [provider]  TRULICITY 0.75 MG/0.5ML SOPN Inject 0.75 mg into the skin once a week.    [provider]   UNABLE TO FIND Take by mouth.    [provider]  VASCEPA 0.5 g CAPS Take 4 capsules by mouth 2 (two) times daily. 05/18/21   [provider]  vitamin B-12 (CYANOCOBALAMIN) 500 MCG tablet Take 500 mcg by mouth daily.    [provider]  vitamin C (ASCORBIC ACID) 500 MG tablet Take 500 mg by mouth daily.    [provider]      Allergies    Statins and Metformin    Review of Systems   Review of Systems  All other systems reviewed and are negative.   Physical Exam Updated Vital Signs BP 127/66   Pulse 60   Temp 98.3 F (36.8 C) (Oral)   Resp 20   Ht 5\' 7"  (1.702 m)   Wt 93.4 kg   SpO2 98%   BMI 32.26 kg/m  Physical Exam Vitals  and nursing note reviewed.  Constitutional:      Appearance: He is well-developed.  HENT:     Head:     Comments: Patient has stellate type laceration with overlying clot over the vertex. Patient has a stellate type laceration, measuring about 4 cm Eyes:     Extraocular Movements: Extraocular movements intact.     Pupils: Pupils are equal, round, and reactive to light.  Neck:     Comments: No midline c-spine tenderness, pt able to turn head to 45 degrees bilaterally without any pain and able to flex neck to the chest and extend without any pain or neurologic symptoms.  Cardiovascular:     Rate and Rhythm: Normal rate.  Pulmonary:     Effort: Pulmonary effort is normal.  Musculoskeletal:        General: No tenderness or deformity.     Cervical back: Neck supple.     Comments: no facial abrasions, no spine step offs, crepitus of the chest or neck, no tenderness to palpation of the bilateral upper and lower extremities, no gross deformities, no chest tenderness, no pelvic pain.   Skin:    General: Skin is warm.  Neurological:     Mental Status: He is alert and oriented to person, place, and time.     ED Results / Procedures / Treatments   Labs (all labs ordered are listed, but only abnormal results are  displayed) Labs Reviewed - No data to display  EKG None  Radiology No results found.  Procedures .Laceration Repair  Date/Time: 10/05/2023 3:28 PM  Performed by: Derwood Kaplan, MD Authorized by: Derwood Kaplan, MD   Consent:    Consent obtained:  Verbal   Consent given by:  Patient   Risks, benefits, and alternatives were discussed: yes     Risks discussed:  Pain, need for additional repair, infection and poor wound healing (Bleeding) Universal protocol:    Procedure explained and questions answered to patient or proxy's satisfaction: yes     Immediately prior to procedure, a time out was called: yes     Patient identity confirmed:  Arm band Laceration details:    Location:  Scalp   Scalp location:  Crown   Length (cm):  4   Depth (mm):  5 Pre-procedure details:    Preparation:  Patient was prepped and draped in usual sterile fashion Exploration:    Limited defect created (wound extended): no     Hemostasis achieved with:  Direct pressure   Wound exploration: wound explored through full range of motion     Contaminated: no   Treatment:    Debridement:  None   Undermining:  None   Scar revision: no   Skin repair:    Repair method:  Staples   Number of staples:  3 Approximation:    Approximation:  Loose Repair type:    Repair type:  Simple Post-procedure details:    Dressing:  Non-adherent dressing   Procedure completion:  Tolerated well, no immediate complications     Medications Ordered in ED Medications - No data to display  ED Course/ Medical Decision Making/ A&P                                 Medical Decision Making Amount and/or Complexity of Data Reviewed Labs: ordered. Radiology: ordered.   83 year old patient comes in after sustaining what appears to be a mechanical fall due to  chronic balance issues. Pertinent past medical includes A-fib for which she is on Eliquis. Collateral history provided by EMS.  Patient has had stable vital signs  and no red flags suggesting acute neurologic injury.  Based on my history and exam, differential diagnosis includes: - Traumatic brain injury including intracranial hemorrhage -Scalp laceration -Scalp hematoma  Based on the initial assessment, the following workup was initiated CT scan of the brain. Patient has no discomfort over the torso at all.  He has no hypoxia, lung sounds are clear. I also feel comfortable clearing the C-spine clinically.  I have independently interpreted the following imaging from the perspective of acute trauma: CT scan of the brain and the results indicate no evidence of brain bleed.  Laceration was repaired appropriately.  Stable for discharge.     Final Clinical Impression(s) / ED Diagnoses Final diagnoses:  None    Rx / DC Orders ED Discharge Orders     None         Derwood Kaplan, MD 10/05/23 1529

## 2023-10-05 NOTE — Discharge Instructions (Signed)
 We saw you in the ER after you had a fall. All the imaging results are normal, no fractures seen. No evidence of brain bleed. Please be very careful with walking, and do everything possible to prevent falls.  The staples will need to be removed in 7 days.

## 2023-11-10 ENCOUNTER — Ambulatory Visit (INDEPENDENT_AMBULATORY_CARE_PROVIDER_SITE_OTHER): Payer: Medicare Other | Admitting: Podiatry

## 2023-11-10 DIAGNOSIS — Z91199 Patient's noncompliance with other medical treatment and regimen due to unspecified reason: Secondary | ICD-10-CM

## 2023-11-10 NOTE — Progress Notes (Signed)
 1. No-show for appointment   No show #1.

## 2023-11-28 ENCOUNTER — Encounter (INDEPENDENT_AMBULATORY_CARE_PROVIDER_SITE_OTHER): Payer: Self-pay

## 2023-12-11 NOTE — Progress Notes (Signed)
 [facility]  -  RETURN VISIT   PRIMARY CARE PROVIDER:   Lenon Layman Tanda DOUGLAS, MD [address] [facility] [place] [address] [phone]                 Nyles, Mitton 12/11/2023  DOB: [birth year] Age: 83 y.o.   PRIMARY CARDIOLOGIST : Ozell Hays, MD   HPI   PATIENT PROFILE: Kentrail Shew  is a 82 y.o. male with a PMH of CAD s/p CABG and multiple PCIs, pAfib, hx of CVA, hx of VF arrest, HTN, HLD, and HFpEF who presents for evaluation and management of the following problems:  1. Paroxysmal atrial fibrillation (CMS/HHS-HCC)   2. Left bundle branch block   3. Chronic heart failure with preserved ejection fraction (CMS/HHS-HCC)   4. On amiodarone therapy   5. DOE (dyspnea on exertion)   6. Primary hypertension      CLINICAL SUMMARY: Interval Histories copied/summarized from previous clinic notes for reference:  06/21/2023 Gloria)  Mr. Rumble presents today for close follow-up after BP was low last visit.  Pause lisinopril .  Had also noted progressive shortness of breath.  Felt like he cannot do as much as the gym.  Wife very concerned.  Here today to follow-up on symptoms.  Blood pressure at home has been better but he has still been falling and getting dizzy.  Wife has considered taking him to the hospital a few times because he cannot do anything without needing to sit and catch his breath .  She feels that he has gone downhill in the past couple months.  Fell twice in the past week, did not hit his head.  He does continue to sleep flat with his CPAP.  Does not think his swelling is worse.  He continues on 1 torsemide  every day and has not added an extra recently.  He also noted some chest tightness this morning when driving down to clinic but there was also traffic and this was stressful.  Shortness of breath is ongoing and may be worse than last visit.  06/27/2023 D/C Summary Per the H&P dated on 06/21/2023: Jeancarlo Leffler is a 83 y.o. male with PMHx of CAD s/p CABG and multiple PCIs, pAfib, hx of CVA, hx of VF arrest, HTN, HLD, and HFpEF. He presents with progressive dyspnea and dizziness over the last couple of weeks. Also, endorsing progressive chest tightness on exertion twice now once two days ago after ellipticaling and once after his cardiology visit yesterday.  His wife feels he has become weaker and gone downhill in the past couple of months. He fell twice last week.  1 episode happened while he was microwaving a sweet treat from himself.  He was up for over a minute and had no preceding symptoms prior to the fall, and he does not remember falling.  He thinks that he lost consciousness and was not confused after.  The other fall he had he thinks was mechanical while he was getting something from the car.  He ellipticals 30 minutes every single day and yesterday could only elliptical for 5 minutes because of the dyspnea.  He also endorses some leg weakness over the past 3 to 4 weeks.  He denies pain, but notes aches and pain in his knees. He has never had a shock before from it.    He saw cardiology 06/21/23 for his DOE which has progressed. They noted PFTs not revealing of lung pathology. There had  been plan for RHC/LHC if symptoms continue/worsen so this was ordered, but he was told to go the ED if he were to have chest pain which he had yesterday, thus he presented to the ED. He has a hx of CABG and multiple PCI last intervention March 2023 with DES to SVG to diagnoal and PTCA to SVG to RCA for ISR.  He says he has had 21 stents.   Medication wise, he takes metop XL 25mg , jardiance 10mg , torsemide  20mg , amiodarone 200mg  daily, pravastatin , repatha, ezeimibe, and vascepa. Lisinopril  was stopped a few weeks ago for hypotension.  He also has a hx of VF and VT iso ACS. He had VT ablation feb 2022. He has an ICD. 31% RV paced. He also has hx of paroxysmal a fib for which he takes eliquis 2.4mg  BID for AC. Has HFpEF  and takes torsemide  40mg  MWF and 20mg  on other days.    Has been adherent to his medications including torsemide  of which he took an extra dose today. States he has been having brisk urinary output since. Denies any orthopnea, PND, worsening lower extremity swelling, urinary symptoms, dysuria, hematuria, abdominal pain, nausea, or vomiting.     On presentation to the ED, vitals were T 36.3 C (97.3 F), HR 61, BP 118/57, RR 24, and satting 97 % on RA. BMP notable for Na 143, K 3.5, HCO3 28, BUN 30*, Cr 2.0*, glucose 114. CBC notable for WBC 7.8, Hgb 18.5*, plts 181. EKG showed no changes compared to prior. Other labs notable for NT-pro-BNP 1,015, trops 34-->33-->31, INR 1.1. He was admitted to HF service for further evaluation and management. _________________   Hospital Course by Problem:   # Unstable angina # CAD s/p 4v CABG 1995 # HTN # HLD Presents with progressive angina for a few weeks c/f unstable angina. HsTnI ruled out. EKG showed no acute changes. LHC 12/13 showed patent LIMA and SVG-D, known occluded SVG-OM, 95% SVG-RCA. PCI was attempted but unsuccessful on first attempt. Taken back to cath with successful DCB to SVG-RCA and SVG-LADD1. Continue home metoprolol , pravastatin  and zetia. No ACE/ARB with AKI, consider starting in outpatient setting. Started plavix . No ASA with apixaban as below.    # Chronic diastolic HFpEF RHC 2022 showed normal filling pressures with CI 2.2. Last TTE in 09/14/2021 showed normal EF with moderate LVH, mild RV systolic function. Repeat TTE 12/12 stable, EF 50%. RHC 12/13 showed normal filling pressures with CI 2.2 Euvolemic throughout admission, resumed home torsemide  on discharge.   # Hx of VT/VF s/p MDT dual chamber AICD # PAF Prior episodes of VF/VT in the setting of ACS. S/p VT ablation February 2022.Device interrogation without significant arrhythmia burden. Home apixaban held for procedures, resumed at discharge   # AKI on CKD stage 3a Cr worsening  since 2023, recently 1.7-1.9. Cr 2.0 on admission. Received diuresis before and after LHC. Cr 1.4 at discharge   # Diabetes Mellitus T2 A1c 6.7%. Home regimen Trulicity, Glargine and Jardiance. Resumed on discharge    # Acquired hypothyroidism Continue home Synthroid  INTERVAL HISTORY: 08/09/2023 Gloria)  Mr. Coker presents today for hosp f/u.  Wt Readings from Last 3 Encounters:  12/11/23 94.9 kg (209 lb 4.8 oz)  11/29/23 97.1 kg (214 lb)  11/28/23 97.2 kg (214 lb 4.8 oz)   History of Present Illness The patient, with a history of cardiac disease and recent stent placement, reports improved but still present shortness of breath. He is able to perform 30 minutes of  exercise on an elliptical machine daily, a significant improvement from his previous state. He denies any chest pain. The patient also reports episodes of vision loss in the left eye, which resolve spontaneously. He reports some swelling in the legs, which he believes may be due to wearing tighter socks. The patient also reports occasional dizziness, but denies any recent falls.  He has had some episodes of vision loss to his left eye and had an outside carotid ultrasound that showed significant plaque burden on the left, minimal on the right.  No significant stenosis was noted though  12/11/23 Echo today.  EF normal. Mild AI.  He noted recently has had to cut down on ellipitcal and finish on bike a legs heavy.  He notes more SOB than in Dec post stents but not to prestent level of DOE.  Week ago had indigestion. None since.  Still exercising.  Some leg swelling a bit worse.  No orthopnea.  Using CPAP.  Falls from dizziness and position in space issue.  If looks L or R falls when walking.  Has LBBB.  Was on 300 mg of amiodarone now 200mg .  PFD ordered  06/25/24 (at follow up with APP)   Normal in Dec of 24    Cath DEC of 24   Coronary arteries Dominance: right Left main: normal LAD: prox 100% LCx: mid 95% RCA: prox  100% Coronary Bypass Grafts [place]ima to Diag (patent): no disease Svg to 1st Marginal (occluded): no disease Svg to 1st Diagonal: prior stent Svg to Distal RCA: 95% ostial, prior stent  Procedure  Lesion: Aortic of SVG to Mid RCA 90% TIMI 3 to (Previously dilated lesion) Hemodynamics (mm Hg) State: Baseline Ao/BP (asc Ao): 139/79 Mean: 99 mmHg LV: 139/10 EDP: 13 mmHg Right Heart Catheterization State: Baseline RA: 7 mmHg (mean) RV: 33/ 5 mmHg PA: 28/ 13 19 mmHg (mean) PCW: 12 mmHg (mean) AV O2: 5.5 vol% Cardiac output: 4.5 L/min Cardiac index: 2.2 L/min-m2 PVR: 1.6 Wood units  Recent labs TSH mildly up 7  Cr 1.5  BUN 18  K 4.3  Last BNP 12/24  1000  LDL 8  Repatha  HISTORY   PROBLEM LIST: Patient Active Problem List  Diagnosis  . Coronary artery disease involving native coronary artery of native heart with angina pectoris ()  . Hypertension  . Type 2 diabetes mellitus with diabetic neuropathy (CMS/HHS-HCC)  . Primary osteoarthritis of right knee  . Postsurgical aortocoronary bypass status  . OSA on CPAP  . Pseudophakia of both eyes  . Primary open angle glaucoma of both eyes, severe stage  . Paroxysmal atrial fibrillation (CMS/HHS-HCC)  . Carotid artery disease ()  . Healthcare maintenance  . History of ventricular fibrillation  . Acquired hypothyroidism  . Automatic implantable cardioverter-defibrillator in situ  . Stage 3a chronic kidney disease (CMS/HHS-HCC)  . Chronic diastolic heart failure (CMS/HHS-HCC)  . Left bundle branch block  . Mechanical complication of implantable cardioverter-defibrillator (ICD)   SOCIAL HX: Social History   Socioeconomic History  . Marital status: Married  . Number of children: 3  . Years of education: HS  Occupational History  . Occupation: Product/process development scientist  Tobacco Use  . Smoking status: Former    Current packs/day: 0.00    Average packs/day: 1.5 packs/day for 26.9 years (40.4 ttl pk-yrs)    Types: Cigarettes, Pipe,  Cigars    Start date: 07/11/1956    Quit date: 06/22/1983    Years since quitting: 40.4  . Smokeless tobacco:  Former  Advertising account planner  . Vaping status: Never Used  Substance and Sexual Activity  . Alcohol use: Yes    Alcohol/week: 2.0 - 3.0 standard drinks of alcohol    Types: 2 - 3 Shots of liquor per week    Comment: DRINKS MONTHLY  . Drug use: No  . Sexual activity: Yes    Partners: Female    Birth control/protection: None    Comment: No ED meds.  Social History Narrative   He is high school educated.  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  He used to smoke a pack a day for 17 years but quit smoking 40 years ago.  He drank 2 ounces of alcohol a day but has not had any since his recent stroke in April, 2016. There is no history of recreational drug use.  He is married and has 3 healthy sons.  He lives with his wife in [place], [place]    Social Drivers of Health   Financial Resource Strain: Low Risk  (11/10/2023)   Overall Financial Resource Strain (CARDIA)   . Difficulty of Paying Living Expenses: Not hard at all  Food Insecurity: No Food Insecurity (11/10/2023)   Hunger Vital Sign   . Worried About Programme researcher, broadcasting/film/video in the Last Year: Never true   . Ran Out of Food in the Last Year: Never true  Transportation Needs: No Transportation Needs (11/10/2023)   PRAPARE - Transportation   . Lack of Transportation (Medical): No   . Lack of Transportation (Non-Medical): No  Physical Activity: Sufficiently Active (05/17/2020)   Exercise Vital Sign   . Days of Exercise per Week: 6 days   . Minutes of Exercise per Session: 30 min  Stress: No Stress Concern Present (05/17/2020)   [employer] of Occupational Health - Occupational Stress Questionnaire   . Feeling of Stress : Not at all  Housing Stability: Low Risk  (11/10/2023)   Housing Stability Vital Sign   . Unable to Pay for Housing in the Last Year: No   . Number of Times Moved in the Last Year: 0   .  Homeless in the Last Year: No    FAMILY HX: Family History  Problem Relation Age of Onset  . Coronary Artery Disease (Blocked arteries around heart) Mother   . Diabetes type II Mother   . Dementia Mother   . Glaucoma Mother   . Alzheimer's disease Mother   . Coronary Artery Disease (Blocked arteries around heart) Father   . Myocardial Infarction (Heart attack) Father   . High blood pressure (Hypertension) Father   . Coronary Artery Disease (Blocked arteries around heart) Sister   . Diabetes Sister   . Cancer Sister        lung  . Coronary Artery Disease (Blocked arteries around heart) Brother   . Myocardial Infarction (Heart attack) Brother   . No Known Problems Son   . No Known Problems Son   . No Known Problems Son   . Macular degeneration Neg Hx   . Anesthesia problems Neg Hx     CURRENT MEDICATIONS AND ALLERGIES   Current Outpatient Medications  Medication Sig Dispense Refill  . AMIOdarone (PACERONE) 200 MG tablet Take 1 tablet (200 mg total) by mouth once daily    . amoxicillin (AMOXIL) 500 MG capsule TAKE 4 CAPSULES BY MOUTH 1 HR PRIOR TO DENTAL APPT    . apixaban (ELIQUIS) 2.5 mg tablet Take 1 tablet (2.5 mg total) by  mouth 2 (two) times daily 180 tablet 3  . cephalexin (KEFLEX) 500 MG capsule Take 4 pills one hour prior to surgical appointment.then three times a day 12 capsule 1  . clopidogreL  (PLAVIX ) 75 mg tablet Take 1 tablet (75 mg total) by mouth once daily 90 tablet 3  . doxycycline  (PERIOSTAT ) 20 MG tablet Take 20 mg by mouth once daily TAKE 1 TABLET(20 MG) BY MOUTH EVERY DAY WITH DINNER    . ezetimibe (ZETIA) 10 mg tablet TAKE 1 TABLET BY MOUTH EVERY DAY 90 tablet 3  . flash glucose sensor (FREESTYLE LIBRE 14 DAY SENSOR) kit Use 1 kit every 14 (fourteen) days for glucose monitoring 6 kit 3  . fluticasone (FLONASE) 50 mcg/actuation nasal spray Place 1 spray into both nostrils once daily as needed     6  . glucagon (GLUCAGEN) 1 mg/mL injection Use as directed for  emergent low blood sugar, 1 each 3  . icosapent ethyL (VASCEPA) 1 gram capsule Take 2 capsules (2 g total) by mouth 2 (two) times daily with meals 360 capsule 3  . insulin  GLARGINE (LANTUS SOLOSTAR U-100 INSULIN ) pen injector (concentration 100 units/mL) Inject 35 Units subcutaneously once daily 33 mL 3  . insulin  needles, disposable, 31 X 5/16  needle as directed    . JARDIANCE 10 mg tablet TAKE 1 TABLET(10 MG) BY MOUTH EVERY DAY 90 tablet 1  . latanoprost  (XALATAN ) 0.005 % ophthalmic solution Place 1 drop into both eyes at bedtime    . levothyroxine (SYNTHROID) 112 MCG tablet Take 1 tablet (112 mcg total) by mouth every morning before breakfast (0630) 30 TO 60 MINUTES BEFORE BREAKFAST ON AN EMPTY STOMACH AND WITH A GLASS OF WATER 90 tablet 3  . metoprolol  succinate (TOPROL -XL) 25 MG XL tablet Take 1 tablet (25 mg total) by mouth once daily 90 tablet 3  . multivitamin tablet Take 1 tablet by mouth every morning       . mupirocin  (BACTROBAN ) 2 % ointment Apply topically    . nitroGLYcerin (NITROSTAT) 0.4 MG SL tablet DISSOLVE 1 TABLET UNDER THE TONGUE EVERY 5 MINUTES AS NEEDED FOR CHEST PAIN, MAY TAKE UP TO 3 DOSES 25 tablet 4  . potassium chloride (KLOR-CON) 10 MEQ ER tablet Take 2 tablets (20 mEq total) by mouth once daily 180 tablet 3  . pravastatin  (PRAVACHOL ) 40 MG tablet TAKE 1 TABLET BY MOUTH EVERY EVENING 90 tablet 3  . prednisoLONE acetate (PRED FORTE) 1 % ophthalmic suspension     . REPATHA SURECLICK 140 mg/mL PnIj ADMINISTER 1 ML UNDER THE SKIN EVERY 14 DAYS 6 mL 3  . SIMBRINZA 1-0.2 % DrpS Place 1 drop into both eyes 2 (two) times daily    . tamsulosin  (FLOMAX ) 0.4 mg capsule Take 1 capsule by mouth once daily as needed     4  . TORsemide  (DEMADEX ) 20 MG tablet 20mg   of torsemide  on Tuesday, Thursday, Saturday, and Sunday. On Mondays, Wednesdays, and Fridays take 2 (40mg ) of torsemide . 135 tablet 3  . pen needle, diabetic (PEN NEEDLE) 31 gauge x 5/16 needle Use as directed To inject 4  times daily. 150 each 11  . TRULICITY 3 mg/0.5 mL subcutaneous pen injector ADMINISTER 3 MG UNDER THE SKIN 1 TIME A WEEK 2 mL 5   No current facility-administered medications for this visit.    Allergies  Allergen Reactions  . Statins-Hmg-Coa Reductase Inhibitors Other (See Comments)    Suspected cause of muscle pain  . Metformin Diarrhea   Answers  submitted by the patient for this visit: Recent Medical Symptoms (Submitted on 12/08/2023) Night sweats: No Appetite Loss: No Weight loss: No Fever: No Daytime sleepiness: Yes visual change: Yes Hearing loss: Yes Sore throat: No Hoarseness: No Cough: No Hemoptysis (Coughing up blood): No Shortness of breath: Yes Wheezing: No Chest pain: No Tachycardia (heart racing): No leg pain: Yes Nausea: No Vomiting: No Diarrhea: No Constipation: No Abdominal pain: No Bowel habits change: No Melena (Black tar-like stool): No Dysuria (Pain with urination): No Frequency (The need to urinate many times a day): No Hesitancy (Difficulty in beginning the flow of urine): No Joint pain: Yes Joint swelling: Yes Myalgias (Muscle pain): Yes Rash: No Pigment changes/discoloration: No Memory loss: Yes Syncope (Fainting or passing out): No Extremity weakness: Yes Paresthesias (Pins and Needles): Yes Loss of balance: Yes   PHYSICAL EXAM   Vitals:   12/11/23 0810  BP: 114/59  Pulse: 70  Resp: 22    Standing 104/54 no sx   Wt Readings from Last 3 Encounters:  12/11/23 94.9 kg (209 lb 4.8 oz)  11/29/23 97.1 kg (214 lb)  11/28/23 97.2 kg (214 lb 4.8 oz)     BP Readings from Last 3 Encounters:  12/11/23 114/59  11/29/23 106/64  11/28/23 105/60   General Appearance:  Alert, cooperative, no distress, appears stated age, overweight, ambulating with a cane, examined on the table  today  HEENT:  PERRL,oropharynx clear  Neck: No carotid bruits, JVD appears normal but hard to see  Lungs:   Clear to auscultation bilaterally,  respirations unlabored  Heart:  Distant but regular rate and rhythm, normal S1 and S2, no murmurs/rubs/gallops, non-displaced PMI, no RV heave  Abdomen:   Rounded but no longer distended, non-tender, bowel sounds active  Extremities: No cyanosis, just trace edema last.  1+ L leg, trace R.    Pulses: Dorsalis pedis 2+ and symmetric bilaterally  Skin: No lower extremity rashes or ulcers  Neurologic: Alert, interactive, and appropriate, grossly moving all 4 extremities.  With eyes closed hard for him to maintain position.  Better with fingers touching the door.    DATA/RESULTS   Recent Labs    05/18/22 0958 10/18/22 0808 04/26/23 0738 06/22/23 0610 11/15/23 0717  CHOLTOTAL 80 78* 95* 66 83*  HDL 48 56.0 45.7 48 52.4  LDLCALC  --  -20* -5* 3 8  LDL 11  --   --   --   --   VLDL  --  42 54  --  22  TRIG 151 211* 271* 75 112    Recent Labs    06/21/23 1946 06/22/23 1325 06/26/23 0517 06/27/23 0433 08/09/23 1122 11/15/23 0717 12/11/23 1039  NA 143   < > 136 140 141 144 139  K 3.5   < > 3.7 4.4 3.8 4.3 3.5  CL 99   < > 107 109* 107 107 102  CO2 28   < > 20* 23 23 24.6 26  BUN 30*   < > 15 15 22* 18 32*  CREATININE 2.0*   < > 1.2 1.4* 1.8* 1.5* 1.7*  BUNCRE 15   < > 13 11 12   --  19  GFR 33   < > 61 50 37 46* 40  GLUCOSE 114   < > 148* 146* 147* 112* 163*  MG 2.4   < > 2.1 2.0 2.3  --   --   ALT 71*  --   --   --  41 38  --  AST 42*  --   --   --  43* 39  --   TBILI 0.9  --   --   --  1.1 1.1  --   ALB 4.3  --   --   --  3.8 4.5  --    < > = values in this interval not displayed.    Recent Labs    06/27/23 0433 08/09/23 1122 11/15/23 0717  WBC 6.4 8.5 6.2  HGB 13.8 16.2 16.0  HCT 40.9 49.6* 46.3  PLT 154 186 159    Recent Labs    09/09/21 1626 09/10/21 0635 11/25/21 0827 10/18/22 0808 04/26/23 0738 05/24/23 1023 06/01/23 0955 06/21/23 1946 08/09/23 1122 10/09/23 0753 11/15/23 0717 12/11/23 1039  INR 1.2* 1.1  --   --   --   --   --  1.1  --   --   --    --   TSH  --   --    < > 4.612 6.461*   < > 6.41*  --  6.75* 8.331* 6.879*  --   HGBA1C  --   --    < > 7.4* 6.7*  --   --   --   --   --  6.9*  --   PROBNP 1,148*  --    < >  --   --   --  878* 1,015*  --   --   --  920*   < > = values in this interval not displayed.     Recent Labs    04/20/21 1001  LIPOA 3     Results for orders placed or performed in visit on 12/11/23  Basic Metabolic Panel (BMP)  Result Value Ref Range   Sodium 139 135 - 145 mmol/L   Potassium 3.5 3.5 - 5.0 mmol/L   Chloride 102 98 - 108 mmol/L   Carbon Dioxide (CO2) 26 21 - 30 mmol/L   Urea Nitrogen (BUN) 32 (H) 7 - 20 mg/dL   Creatinine 1.7 (H) 0.6 - 1.3 mg/dL   Glucose 836 (H) 70 - 140 mg/dL   Calcium 8.7 8.7 - 89.7 mg/dL   Anion Gap 11 3 - 12 mmol/L   BUN/CREA Ratio 19 6 - 27   Glomerular Filtration Rate (eGFR)  40 mL/min/1.73sq m  Pro-Brain Natriuretic peptide, N-Terminal (NT-pro-BNP)  Result Value Ref Range   Pro-Brain Natriuretic Peptide, N-terminal (NT-Pro-BNP) 920 (H) <=850 pg/mL    Cardiac Studies   Results for orders placed during the hospital encounter of 09/09/21  CATH RIGHT HEART AND CORONARY ANGIOGRAPHY  Narrative Impressions:  Severe 99% distal SVG-diagonal 1 stenosis: ISR noted, s/p PTCA and placement of Resolute Froniter 2.75 x 15 mm DES deployed at high pressure Severe 99% ostial SVG-distal RCA stenosis: ISR noted, evaluated by IVUS and treated with PTCA, improvement from TIMI 2 to TIMI 3 flow Patent LMCA-proximal Lcx stent Occluded SVG-Om1 Occluded native LAD and RCA Normal LVEDP, 10-15 mm Hg, noted on left heart catheterization  Recommendations:  Risk factor modification for CAD and CAD risk factors Routine post-PCI care Refer for cardiac rehab Aspirin  81 mg lifelong P2Y12 inhibitor for at least 6 months Avoid elective surgery while receiving a P2Y12 inhibitor Case discussed with Dr. EMERSON Altes attending.   Results for orders placed during the hospital  encounter of 09/09/21  ECHOCARDIOGRAM 2D COMPLETE  Narrative [facility]            Omario, Ander [id number]  DOB: 1941-01-06 CARDIAC DIAGNOSTIC UNIT               Date: 09/14/2021 11:00:00 Adult     Male   Age: 5 ECHO-DOPPLER REPORT                 Inpatient [id number] ---------------------------------------------------- MD1: Netta Camie Maudlin STUDY: Chest Wall          TAPE: 0000:00:0:00:00 BP: 129/68 ECHO: Yes   DOPPLER: No   FILE: [phone]:   HR: 62 COLOR: No   CONTRAST: No      MACHINE: [id number] #10Height: 67 in RV BIOPSY: No         3D: Yes  SOUND QLTY: Moderate  Weight: 229 lbs MEDIUM: None                                      BSA: 2.22,  BMI: 35.90 ------------------------------------------------------------------------------ HISTORY:  Heart Failure REASON: Assess LV function INDICATION: I50.32 - Chronic diastolic (congestive) heart failure (CMS-HCC).   ECHOCARDIOGRAPHIC MEASUREMENTS ----------------------------------------------- 2D DIMENSIONS AORTA          Values     Normal RangeMAIN PA      Values     Normal Range Annulus:  nm*  cm    [2 - 3.2]      PA Main:  nm*  cm    [1.5 - 2.1] Aorta Sin:   3.1 cm    [2.8 - 4]   RIGHT VENTRICLE ST Junction:  nm*  cm    [2.3 - 3.5]    RV Base:   4.6 cm    [2.5 - 4.1] Asc.Aorta:   3.0 cm    [2.2 - 3.8]     RV Mid:   3.4 cm    [1.9 - 3.5] LEFT VENTRICLE                         RV Length:  nm*  cm    [  ] LVIDd:   4.3 cm    [4.2 - 5.8] RIGHT ATRIUM LVIDs:   2.4 cm    [2.4 - 4]      RA Area:  27   cm2   [ <= 20] LVEDVi:  69.0 ml/m2 [34 - 74]         RAVi:  40   ml/m2 [11 - 39] LVESVi:  31.0 ml/m2 [11 - 31]   INFERIOR VENA CAVA FS:  44   %     [ >= 25]       Max.IVC:  nm*  cm    [ <= 2.1] SWT:   1.5 cm    [0.6 - 1]      Min.IVC:  nm*  cm    [ <= 1.7] PWT:   1.5 cm    [0.6 - 1]   __________________ LEFT ATRIUM                           nm* - not measured LA Diam:   4.5 cm    [3 - 4] LA Area:  23   cm2   [ <=  20] LA Volume:  82   mL    [18 - 58] LAVi:  37   ml/m2 [16 - 34]  ECHOCARDIOGRAPHIC DESCRIPTIONS ----------------------------------------------- AORTIC ROOT Size: Normal Dissection: INDETERM FOR DISSECTION  AORTIC  VALVE Leaflets: Tricuspid             Morphology: MILDLY THICKENED Mobility: Fully Mobile  LEFT VENTRICLE                                      Anterior: Normal Size: Normal                                 Lateral: Normal Contraction: Normal                                  Septal: Normal Closest EF: >55%(Estimated)  Calc.EF: 56% (3D)      Apical: Normal LV masses: No Masses                             Inferior: Normal LVH: MODERATE LVH CONCENTRIC              Posterior: Normal LV GLS(GE): -17.0% Normal Range [ <= -16] LV Note: 2 Dias.FxClass: INDETERMINATE  MITRAL VALVE Leaflets: Normal                  Mobility: Fully mobile Morphology: Normal  LEFT ATRIUM Size: MILDLY ENLARGED LA masses: No masses Normal IAS  MAIN PA Size: Not seen  PULMONIC VALVE Morphology: Normal Mobility: Fully Mobile  RIGHT VENTRICLE Size: MILDLY ENLARGED           Free wall: HYPOCONTRACTILE Contraction: MILD GLOBAL DECREASE      RV masses: CATHETER IN RV TAPSE:   1.7 cm,  Normal Range [>= 1.6 cm]  TRICUSPID VALVE Leaflets: Normal                  Mobility: Fully mobile Morphology: Normal TV Note:  TV S' 0.09 m/s  RIGHT ATRIUM Size: MILDLY ENLARGED            RA Other: None RA masses: CATHETER IN RA  PERICARDIUM Fluid: No effusion  INFERIOR VENACAVA Size: SMALL      Normal respiratory collapse  DOPPLER ECHO and OTHER SPECIAL PROCEDURES ------------------------------------ Aortic: TRIVIAL AR             No AS  Mitral: TRIVIAL MR             No MS MV Inflow E Vel.= 51.0 cm/s  MV Annulus E'Vel.= 5.0 cm/s  E/E'Ratio= 10  Tricuspid: TRIVIAL TR             No TS 2.3 m/s peak TR vel   24 mmHg peak RV pressure  Pulmonary: No PR                  No  PS  Other:  INTERPRETATION --------------------------------------------------------------- NORMAL LEFT VENTRICULAR SYSTOLIC FUNCTION WITH MODERATE LVH MILD RV SYSTOLIC DYSFUNCTION (See above) VALVULAR REGURGITATION: TRIVIAL AR, TRIVIAL MR, TRIVIAL TR NO VALVULAR STENOSIS POOR PARASTERNAL WINDOWS  3D acquisition and reconstructions were performed as part of this examination to more accurately quantify the effects of identified structural abnormalities as part of the exam. (post-processing on an Independent workstation).  Compared with prior Echo study on 03/23/2021: RV SIZE SLIGHTLY INCREASED RV MILDLY HYPOKINETIC   (Report version 3.0)  Interpreted and Electronically signed Perform. by: G. Wilfred Hong, RDCS                by: Donzell Dewaine Mort, M Resp.Person: G. Wilfred Hong, RDCS                On: 09/14/2021 15:00:59  Positive Results Contact: Lauraine Ka, NP Comments:  Results for orders placed during the hospital encounter of 06/21/23  CATH PCI  Narrative Impressions:  Left radial access was obtained.  The patient was on aspirin  and Plavix  prior to the procedure.  Adjunctive heparin was given to achieve an ACT above 250.  The SVG to RCA was engaged with a 6 [place] MPA guide catheter and wired easily.  The distal and proximal stenosis was ballooned with a 3.25 mm [place] balloon at high pressures.  IVUS performed which showed distal reference vessel was ~3.0-3.25 mm, proximal reference vessel was ~3.5 mm.  We took a 3.0 mm Wolverine balloon and dilated distally and a 3.5 mm Wolverine balloon proximally with further dilation.  A 3.5 mm Ramer balloon at high pressures was applied to the proximal part of the stent with minimal residual stenosis.  We then delivered a 3.0 x 20 agent balloon to the distal part of the SVG stenosis for a 3-minute period.  We did the same with a 3.5 x 20 mm agent to the proximal part of the vessel.  There did appear to be a ~10-20 percent  residual stenosis. We converted TIMI-2 flow to TIMI-3 flow with excellent angiographic result.  We then turned our attention to the SVG to diagonal.  This was engaged with a 6 [place] AL-1 guide catheter, wired with some moderate difficulty and dilated with a 3.25 mm [place] balloon distally as well as proximally at high pressures.  IVUS revealed the stent placed distally with severe ISR was underexpanded (~3.0 mm in diameter distally, ~4.0 mm proximally).  We took a 3.0 mm Wolverine to the distal stenosis and a 4.0 mm Wolverine to the proximal stenosis.  There was still some underexpansion therefore we took a 3.0 mm IV L balloon to the distal stenosis with good expansion after delivering 60 treatments.  A 4.0 mm shockwave balloon was used for proximal stenosis with 4.0 mm  balloon was used at high pressures to further expand the proximal portion. A 3.0 x 12 mm agent balloon was used for the distal stenosis with 3 minutes is a 4.0 x 12 mm agent balloon for proximal stenosis with the same amount of time.  There was excellent angiographic result.  Summary: 1. Successful IVL and DCB facilitated PCI to SVG-RCA and SVG-LADD1. SVG-RCA with Wolverine with a 3.0 and 3.5  mm then the same sized DCB at 3 minutes. SVG-LADD1 treated with wolverine 3.0, 4.0, and shockwave of the same size, and finally DCB of the same size at 3 minutes. Excellent angiographic result.  Recommendations:  1. Continue plavix , once on Eliquis, can stop aspirin . TR band off in 2 hours. Results communicated to Dr. Erle.  Results for orders placed during the hospital encounter of 06/21/23  ECHOCARDIOGRAM 2D COMPLETE WITH CONTRAST  Narrative [facility]                         [facility] [place] [facility]  [id number] DOB: [birth year]  Age: 29 ECHO-DOPPLER REPORT                             Date: 06/22/2023 Male Inpatient LOCATION: [place] Lab MD1: SUDARSHAN RAJAGOPAL STUDY:  ECHO COMPLETE WITH CONTRAST               SOUND QLTY: Poor ECHO: Yes                                           STRAIN: No COLOR: Yes                                               3D: No DOPPLER: Yes                                               BP: 129 / 83 RV BIOPSY: No                                                HR: 62 BPM CONTRAST: Yes                                           Height: 67 in MEDIUM: Definity                                      Weight: 212 lbs MACHINE: CDU - E95-8                                      BSA: 2.1  ------------------------------------------------------------------------------------------ History: Chest pain Reason: Assess LV function Indication: R07.9- Chest pain, unspecified.  CONCLUSION ------------------------------------------------------------------------------- MILD LEFT VENTRICULAR SYSTOLIC DYSFUNCTION WITH MILD LVH ESTIMATED EF: 50% NORMAL LA PRESSURES WITH NORMAL DIASTOLIC FUNCTION NORMAL RIGHT VENTRICULAR SYSTOLIC FUNCTION VALVULAR REGURGITATION: No AR, TRIVIAL MR, No PR, TRIVIAL TR NO VALVULAR STENOSIS PHYSICIAN IMPRESSIONS -------------------------------------------------------------------- Septal wall motion abnormality in the context of wide QRS also noted on prior echo   Compared with prior Echo study on   09/14/2021 : NO SIGNIFICANT CHANGES  ECHOCARDIOGRAPHIC DESCRIPTIONS ----------------------------------------------------------- AORTIC ROOT Asc Ao Size: Normal Dissection: INDETERMINATE FOR DISSECTION  AORTIC VALVE Leaflets: Tricuspid                               Mobility: Fully Mobile Morphology: Normal AR: No AR                                         AS: No AS AV Mass: No Masses  LEFT VENTRICLE Size: Normal LVH: MILD LVH  Contraction: MILD DECREASE Closest EF: 50% LV Mass: No Masses Dias. FxClass: Normal  WALL MOTION Basal             Mid               Apical Anterior Septum: Normal            Hypokinetic        Hypokinetic Anterior Wall: Normal            Normal            Normal Lateral Wall: Normal            Normal            Normal Posterior Wall: Normal            Normal Inferior Wall: Normal            Normal            Normal Inferior Septum: Normal            Hypokinetic  Rest Rest Score Index: 1.18   MITRAL VALVE Leaflets: Normal                                  Mobility: Fully Mobile Morphology: Normal MR: TRIVIAL MR                                    MS: No MS MV masses: No Masses  LEFT ATRIUM Size: MODERATELY ENLARGED LA masses: No Masses  MAIN PA Size: Normal                                  Diameter: 2 cm  PULMONIC VALVE Leaflets: UNKNOWN                                 Mobility: Fully Mobile Morphology: Normal PR: No PR                                         PS: No PS PV masses: No Masses  RIGHT VENTRICLE Size: ENLARGED                               Free Wall: Normal Contraction: Normal TAPSE: 2.2 cm RV masses: CATHETER IN RV  TRICUSPID VALVE Leaflets: Normal                                  Mobility: Fully Mobile Morphology: Normal TR: TRIVIAL TR                                    TS: No TS TV masses: No Masses  RIGHT ATRIUM Size: ENLARGED RA masses: CATHETER IN RA  PERICARDIUM Fluid: No Effusion  INFERIOR VENA CAVA Size: Normal Resp.Collapse: Normal Respiratory Collapse  OTHER SPECIAL PROCEDURES ----------------------------------------------------------------- Contrast: Definity SHOWS ENHANCED LV BORDERS  RESTING ECHOCARDIOGRAPHIC MEASUREMENTS --------------------------------------------------- AORTA Measurements  Values    Units     Normal Range Aorta Sin: 3         cm        [2.8 - 4.0] Asc.Aorta: 2.9       cm        [2.2 - 3.8] Asc. Aorta BSA: 1.4       cm/m2     [1.1 - 1.9]  LEFT VENTRICLE LVIDd: 4.9       cm        [4.2 - 5.8] LVIDs: 3.1       cm        [2.5- 4] LVIDd/BSA: 2.4       cm/m2 SWT: 1.1       cm        [0.6 -  1] PWT: 1.1       cm        [0.6 - 1] LV EDV MOD BP: 83        ml LVEDVi: 40        ml/m2     [34 - 74] LV ESV MOD BP: 23        ml LVESVi: 11        ml/m2     [11 - 31]  LEFT ATRIUM LA Diam: 4.8       cm        [3 - 4] LA Area: 24.1      cm2       [<= 20] LA Volume: 93        ml        [18 - 58] LAVi: 45        ml/m2     [16 - 34]  RIGHT VENTRICLE RV TAPSE: 2.2       cm RV Base: 4.2       cm        [2.5 - 4.1] RV Mid: 2.8       cm        [1.9 - 3.5]  Pressures, Gradients, and DOPPLER ECHO ---------------------------------------------------    Tricuspid Regurgitation Values TR Pk. Vel.: 2.1       m/s RA Pressure: 3         mmHg RV pressure: 20        mmHg      Peak  Perform By: Jodie Dhimitri, BS, RDCS Entered By: Jodie Dhimitri, BS, RDCS Res. Person: Jodie Kicks, BS, RDCS  Electronically signed by Barnetta Fairly, M.D. on:06/22/2023 3:45:52 PM with status of Final  The images are stored in the [facility] system, please contact the clinical provider for images related to this study.  Echo 12/11/23 my review.  Normal EF, mild AI.   ASSESSMENT AND PLAN  Mr. Piper presents today for follow up of the following problems:  1. Paroxysmal atrial fibrillation (CMS/HHS-HCC)  2. Left bundle branch block  3. Chronic heart failure with preserved ejection fraction (CMS/HHS-HCC)  4. On amiodarone therapy  5. DOE (dyspnea on exertion) -     Pro-Brain Natriuretic peptide, N-Terminal (NT-pro-BNP); Future  6. Primary hypertension -     Basic Metabolic Panel (BMP); Future     DOE-was improved after recent stenting.  Back to prior exercise tolerance at last visit. Now is worsening again.  Unclear if ischemia vs pacing.  Now pacing 28%  RV pacing.  BNP today is 980 which is down from 1015 and back to baseline.  He appears fluid up and I estimate 10 lbs.  Not on MRA.  Room to move on torsemide  at last visit.  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  At last visit dyspnea improved but some worse today but not back to his nadir in Dec.  Remains on Plavix .  May need to repeat cath.  Meds as below.  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 S/p VT ablation February 2022. Has ICD. Continues on amio Now 200mg  daily. Followed by [facility].   Paroxysmal atrial fibrillation- Regular rhythm today on exam. No AF at last interrogation.  Continues on eliquis 2.5mg  BID, metoprolol  succinate 25mg  daily, and amio. Followed by [facility].  HFpEF-volume status looks mildly pu today  adding back spiro.  12.5 daily.  Continues on torsemide  and SGLT2i. RHC during hospitalization showed normal filling pressures with index 2.2   DISPOSITION   No follow-ups on file.  Future Appointments     Date/Time Provider Department Center Visit Type   01/08/2024 8:15 AM Lanna, Glean Agent, MD [facility] EYE LASER   01/17/2024 10:45 AM Kathlynn Ozell Pac, MD [facility] C PROCEDURE (WITH COPAY)   01/19/2024 10:15 AM Kathlynn Ozell Pac, MD [facility] C POST-OP   01/31/2024 10:15 AM Kathlynn Ozell Pac, MD [facility] C POST-OP   02/28/2024 8:40 AM (Arrive by 8:25 AM) Bary Rosina Reagin, PA [facility] [place] SCOT BEERS RETURN   03/07/2024 10:30 AM (Arrive by 10:00 AM) Glennda Corean Helling, NP [facility] CARD RETURN   05/20/2024 7:30 AM [facility] C LAB   05/22/2024 10:40 AM (Arrive by 10:10 AM) Lynnea Pica, MD [facility] ENDO RETURN ADULT   05/27/2024 9:00 AM Lenon Layman Tanda DOUGLAS, MD [facility] C PHYSICAL   06/12/2024 1:30 PM (Arrive by 1:00 PM) [place], Beverley Skene, MD [facility] RETURN VISIT   06/25/2024 1:00 PM (Arrive by 12:30 PM) Novella Jeoffrey Morrison, NP [facility] [place] 2[facility] [place] CARD RETURN   06/25/2024 2:45 PM (Arrive by 2:15 PM) [facility] 4 CARD TREAD/CPX [facility] [place] 4th [facility] [place] SPIROMETRY/DLCO/LUNG VOLUMES   01/07/2025 9:00 AM (Arrive by 8:30 AM) Clarinda Ozell LABOR, MD [facility] CARD RETURN      Patient Instructions  Echo today mild leaking of aortic valve no issue  Normal heart squeezing (EF) better than in Dec.   Labs today . May add a med called spironolactone or more torsemide  or both to better control fluid.  Need to see labs first.  Will communicate tomorrow   I think you are 5 lbs up and need to be 5 lbs lower.  Labs will help.   I spent a total of 35 minutes in both face-to-face and non-face-to-face activities, excluding procedures performed, for this visit on the date of this encounter.   Attestation Statement:   I personally performed the service, non-incident to. [facility])  OZELL DELENA HAYS, MD

## 2023-12-21 ENCOUNTER — Ambulatory Visit (INDEPENDENT_AMBULATORY_CARE_PROVIDER_SITE_OTHER): Admitting: Podiatry

## 2023-12-21 ENCOUNTER — Encounter: Payer: Self-pay | Admitting: Podiatry

## 2023-12-21 DIAGNOSIS — M79675 Pain in left toe(s): Secondary | ICD-10-CM

## 2023-12-21 DIAGNOSIS — M2041 Other hammer toe(s) (acquired), right foot: Secondary | ICD-10-CM | POA: Diagnosis not present

## 2023-12-21 DIAGNOSIS — E1142 Type 2 diabetes mellitus with diabetic polyneuropathy: Secondary | ICD-10-CM | POA: Diagnosis not present

## 2023-12-21 DIAGNOSIS — M2042 Other hammer toe(s) (acquired), left foot: Secondary | ICD-10-CM

## 2023-12-21 DIAGNOSIS — B351 Tinea unguium: Secondary | ICD-10-CM

## 2023-12-21 DIAGNOSIS — M79674 Pain in right toe(s): Secondary | ICD-10-CM

## 2023-12-21 NOTE — Progress Notes (Signed)
  Subjective:  Patient ID: [name] Ferguson, male    DOB: 1940/10/25,  MRN: [id number]  83 y.o. male presents to clinic with  at risk foot care with history of diabetic neuropathy and painful elongated mycotic toenails 1-5 bilaterally which are tender when wearing enclosed shoe gear. Pain is relieved with periodic professional debridement. Patient is requesting diabetic shoes on today's visit.   New problem(s): None   PCP is Jimmy Moulding, MD. [name] Ferguson 06/29/2023.  Allergies  Allergen Reactions   Statins Other (See Comments)    Suspected cause of muscle pain   Metformin Diarrhea    Review of Systems: Negative except as noted in the HPI.   Objective:  [name] Ferguson is a pleasant 83 y.o. male in NAD. AAO x 3.  Vascular Examination: Vascular status intact b/l with palpable pedal pulses. CFT immediate b/l. No edema. No pain with calf compression b/l. Skin temperature gradient WNL b/l. No varicosities noted. No ischemia or gangrene noted b/l LE. No cyanosis or clubbing noted b/l LE.  Neurological Examination: Pt has subjective symptoms of neuropathy. Protective sensation decreased with 10 gram monofilament b/l.  Dermatological Examination: Pedal skin with normal turgor, texture and tone b/l. Toenails 1-5 b/l thick, discolored, elongated with subungual debris and pain on dorsal palpation. No hyperkeratotic lesions noted b/l. There is evidence of subacute subungual hematoma of the left great toe. Nailplate remains adhered. There is no  tenderness to palpation. No corns, calluses nor porokeratotic lesions noted.  Musculoskeletal Examination: Muscle strength 5/5 to b/l LE. Hammertoe(s) 2-5 bilaterally. Utilizes cane for ambulation assistance.  Radiographs: None  Last A1c:       No data to display           Assessment:   1. Pain due to onychomycosis of toenails of both feet   2. Diabetic polyneuropathy associated with type 2 diabetes mellitus (HCC)   3. Hammer toes of both feet      Plan:   Orders Placed This Encounter  Procedures   For home use only DME Other see comment    To [facility] [address] [place], [place]  Dispense one pair extra depth shoes and 3 pair heat moldable insoles.    Length of Need:   Lifetime  -Consent given for treatment as described below: -Examined patient. -Continue foot and shoe inspections daily. Monitor blood glucose per PCP/Endocrinologist's recommendations. -Rx sent to [facility] for  one pair diabetic shoes and 3 pair total contact insoles. To [facility] [address] [place], [place] [address] Phone: [phone] Fax: [phone]. -Mycotic toenails 1-5 bilaterally were debrided in length and girth with sterile nail nippers and dremel without incident. -Patient/POA to call should there be question/concern in the interim.  Return in about 10 weeks (around 02/29/2024).  [name] Ferguson, DPM      [place] LOCATION: [address], [place] [address]                   Office [phone]   [facility] LOCATION: [address] [place], [place] [address] Office [phone]

## 2023-12-27 ENCOUNTER — Emergency Department
Admission: EM | Admit: 2023-12-27 | Discharge: 2023-12-27 | Disposition: A | Discharge status: Home / Self Care | Source: Home / Self Care

## 2024-02-13 ENCOUNTER — Ambulatory Visit: Payer: Medicare Other | Admitting: Urology

## 2024-02-22 NOTE — Progress Notes (Deleted)
 02/22/24 11:59 AM   [name] Ferguson 04-07-1941 [id number]  Referring provider:  Lenon Layman ORN, MD [address] [facility] [place],  [place] [address]  Urological history  1. BPH with LU TS -PSA (2022)  2.6  -tamsulosin  0.4 mg daily   2. Prostate nodule -contrast CT 2017 - Enlarged prostate with small internal low attenuating structure that is not well visualized by CT; differential considerations include, but are not limited to a BPH nodule or sequela of prostatitis with possible tiny non drainable fluid collection  - stable on exam    3. Elevated PSA -7.34 ng/mL on 06/10/2016  No chief complaint on file.   HPI: [name] Ferguson is a 83 y.o. man who presents today for a 1 year follow-up with IPSS and exam with his wife, [name] Ferguson.   Previous records reviewed.     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   Squamous cell carcinoma of skin 03/27/2023   Posterior vertex scalp. WD SCC. EXC 04/11/23   Stroke [facility])     Surgical History: Past Surgical History:  Procedure Laterality Date   BACK SURGERY     CARDIAC CATHETERIZATION     COLONOSCOPY     COLONOSCOPY WITH PROPOFOL  N/A 09/22/2015   Procedure: COLONOSCOPY WITH PROPOFOL ;  Surgeon: Rogelia Copping, MD;  Location: [facility] ENDOSCOPY;  Service: Endoscopy;  Laterality: N/A;   CORONARY  ANGIOPLASTY WITH STENT PLACEMENT     CORONARY ARTERY BYPASS GRAFT  07/11/1994   HEMORRHOID SURGERY  07/11/2005   HEMORRHOID SURGERY  08/12/2015   excision internal hemorrhoid Dr Dellie   JOINT REPLACEMENT Left 07/11/2002   Knee    Home Medications:  Allergies as of 02/27/2024       Reactions   Statins Other (See Comments)   Suspected cause of muscle pain   Metformin Diarrhea        Medication List        Accurate as of February 22, 2024 11:59 AM. If you have any questions, ask your nurse or doctor.          amiodarone 200 MG tablet Commonly known as: PACERONE Take 300 mg by mouth daily.   Brinzolamide-Brimonidine 1-0.2 % Susp Place 1 drop into both eyes daily at 12 noon.   clopidogrel  75 MG tablet Commonly known as: PLAVIX  Take 75 mg by mouth daily.   doxycycline  20 MG tablet Commonly known as: PERIOSTAT  TAKE 1 TABLET(20 MG) BY MOUTH EVERY DAY WITH DINNER What changed: See the new instructions.   Eliquis 2.5 MG Tabs tablet Generic drug: apixaban Take 2.5 mg by mouth 2 (two) times daily.   ezetimibe 10 MG tablet Commonly known as: ZETIA Take 1 tablet by mouth daily.   glucagon 1 MG injection Inject 1 mg into the muscle  once as needed (Low blood sugar).   icosapent Ethyl 1 g capsule Commonly known as: VASCEPA Take 2 g by mouth 2 (two) times daily.   insulin  glargine 100 UNIT/ML Solostar Pen Commonly known as: LANTUS Inject 35 Units into the skin daily.   Jardiance 10 MG Tabs tablet Generic drug: empagliflozin Take 10 mg by mouth daily.   latanoprost  0.005 % ophthalmic solution Commonly known as: XALATAN  Place 1 drop into both eyes at bedtime.   levothyroxine 112 MCG tablet Commonly known as: SYNTHROID Take 112 mcg by mouth daily before breakfast.   lisinopril  2.5 MG tablet Commonly known as: ZESTRIL  Take 2.5 mg by mouth daily.   metoprolol  succinate 25 MG 24 hr tablet Commonly known as: TOPROL -XL Take 25 mg by mouth daily.   MULTIVITAMIN  ADULT PO Take 1 tablet by mouth daily at 12 noon.   nitroGLYCERIN 0.4 MG SL tablet Commonly known as: NITROSTAT Place 0.4 mg under the tongue every 5 (five) minutes as needed for chest pain.   potassium chloride 10 MEQ tablet Commonly known as: KLOR-CON Take 10 mEq by mouth daily.   pravastatin  40 MG tablet Commonly known as: PRAVACHOL  Take 40 mg by mouth daily.   Repatha SureClick 140 MG/ML Soaj Generic drug: Evolocumab Inject 1 mL into the skin every 14 (fourteen) days.   tamsulosin  0.4 MG Caps capsule Commonly known as: FLOMAX  TAKE 1 CAPSULE(0.4 MG) BY MOUTH DAILY What changed:  how much to take how to take this when to take this additional instructions   torsemide  20 MG tablet Commonly known as: DEMADEX  Take 20 mg by mouth daily. May take one more tablet if swelling occurs   Trulicity 3 MG/0.5ML Soaj Generic drug: Dulaglutide Inject 3 mg into the skin once a week.        Allergies:  Allergies  Allergen Reactions   Statins Other (See Comments)    Suspected cause of muscle pain   Metformin Diarrhea    Family History: Family History  Problem Relation Age of Onset   Diabetes Mother    Alzheimer's disease Mother    Cancer Sister    Heart disease Other        Family members in general   Diabetes Brother    Prostate cancer Neg Hx    Bladder Cancer Neg Hx    Kidney disease Neg Hx     Social History:  reports that he quit smoking about 42 years ago. His smoking use included cigarettes. He has never used smokeless tobacco. He reports current alcohol use. He reports that he does not use drugs.   Physical Exam: There were no vitals taken for this visit.  Constitutional:  Well nourished. Alert and oriented, No acute distress. HEENT: [place] AT, moist mucus membranes.  Trachea midline, no masses. Cardiovascular: No clubbing, cyanosis, or edema. Respiratory: Normal respiratory effort, no increased work of breathing. GI: Abdomen is soft, non tender, non  distended, no abdominal masses. Liver and spleen not palpable.  No hernias appreciated.  Stool sample for occult testing is not indicated.   GU: No CVA tenderness.  No bladder fullness or masses.  Patient with circumcised/uncircumcised phallus. ***Foreskin easily retracted***  Urethral meatus is patent.  No penile discharge. No penile lesions or rashes. Scrotum without lesions, cysts, rashes and/or edema.  Testicles are located scrotally bilaterally. No masses are appreciated in the testicles. Left and right epididymis are normal. Rectal: Patient with  normal sphincter tone. Anus and perineum without scarring or rashes. No rectal  masses are appreciated. Prostate is approximately *** grams, *** nodules are appreciated. Seminal vesicles are normal. Skin: No rashes, bruises or suspicious lesions. Lymph: No cervical or inguinal adenopathy. Neurologic: Grossly intact, no focal deficits, moving all 4 extremities. Psychiatric: Normal mood and affect.   Laboratory Data: See HPI and EPIC I have reviewed the labs.  See HPI.     Pertinent Imaging N/A  Assessment & Plan:    1. BPH with LU TS - stable, improving, worsening mild, moderate severe symptoms *** - no signs of retention, infection or malignancy *** - PSA up to date *** - DRE benign *** - UA benign *** - PVR < 300 cc *** - most bothersome symptoms are *** - encouraged avoiding bladder irritants, fluid restriction before bedtime and timed voiding's - Initiate alpha-blocker (***), discussed side effects *** - Initiate 5 alpha reductase inhibitor (***), discussed side effects *** - Continue tamsulosin  0.4 mg daily, alfuzosin 10 mg daily, Rapaflo 8 mg daily, terazosin, doxazosin, Cialis 5 mg daily and finasteride 5 mg daily, dutasteride 0.5 mg daily***:refills given - Cannot tolerate medication or medication failure, schedule cystoscopy *** - educated on red flag symptoms: acute retention, gross hematuria, fever, severe pain - advised to call  clinic or go to the ED if these occur - return to clinic in *** symptom re-evaluation ***       No follow-ups on file.  CLOTILDA HELON RIGGERS   [facility] [address], [address] [place], [place] [address] [phone]

## 2024-02-27 ENCOUNTER — Ambulatory Visit: Payer: Self-pay | Admitting: Urology

## 2024-02-27 DIAGNOSIS — N401 Enlarged prostate with lower urinary tract symptoms: Secondary | ICD-10-CM

## 2024-02-29 ENCOUNTER — Encounter: Payer: Self-pay | Admitting: Podiatry

## 2024-02-29 ENCOUNTER — Ambulatory Visit: Admitting: Podiatry

## 2024-02-29 DIAGNOSIS — B351 Tinea unguium: Secondary | ICD-10-CM | POA: Diagnosis not present

## 2024-02-29 DIAGNOSIS — E1142 Type 2 diabetes mellitus with diabetic polyneuropathy: Secondary | ICD-10-CM

## 2024-02-29 DIAGNOSIS — M79674 Pain in right toe(s): Secondary | ICD-10-CM | POA: Diagnosis not present

## 2024-02-29 DIAGNOSIS — M79675 Pain in left toe(s): Secondary | ICD-10-CM | POA: Diagnosis not present

## 2024-02-29 NOTE — Progress Notes (Signed)
  Subjective:  Patient ID: [name] Ferguson, male    DOB: 1941/04/26,  MRN: [id number]  [name] Ferguson presents to clinic today for at risk foot care with history of diabetic neuropathy and painful thick toenails that are difficult to trim. Pain interferes with ambulation. Aggravating factors include wearing enclosed shoe gear. Pain is relieved with periodic professional debridement. Patient states he did receive his diabetic shoes from [employer] and he is pleased with them. Chief Complaint  Patient presents with   [facility]    Rm1 Diabetic foot care/ Dr. Layman Piety last visit Nov 22, 2023/ A1C 6.9   New problem(s): None.   PCP is Piety Layman ORN, MD.  Allergies  Allergen Reactions   Statins Other (See Comments)    Suspected cause of muscle pain   Metformin Diarrhea    Review of Systems: Negative except as noted in the HPI.  Objective: No changes noted in today's physical examination. There were no vitals filed for this visit. [name] Ferguson is a pleasant 83 y.o. male in NAD. AAO x 3.  Vascular Examination: Capillary refill time immediate b/l. Palpable pedal pulses. Pedal hair present b/l. Pedal edema b/l ankles. No pain with calf compression b/l. Skin temperature gradient WNL b/l. No cyanosis or clubbing b/l. No ischemia or gangrene noted b/l.   Neurological Examination: Pt has subjective symptoms of neuropathy. Protective sensation decreased with 10 gram monofilament b/l.  Dermatological Examination: Pedal skin with normal turgor, texture and tone b/l.  No open wounds. No interdigital macerations.   Toenails 1-5 b/l thick, discolored, elongated with subungual debris and pain on dorsal palpation.   No hyperkeratotic nor porokeratotic lesions.  Musculoskeletal Examination: Muscle strength 5/5 to all lower extremity muscle groups bilaterally. No pain, crepitus or joint limitation noted with ROM b/l LE. Hammertoe(s) 2-5 b/l.[name] Ferguson Patient ambulates with cane  assistance.  Radiographs: None  Assessment/Plan: 1. Pain due to onychomycosis of toenails of both feet   2. Diabetic polyneuropathy associated with type 2 diabetes mellitus (HCC)   Patient was evaluated and treated. All patient's and/or POA's questions/concerns addressed on today's visit. Toenails 1-5 debrided in length and girth without incident. Continue foot and shoe inspections daily. Monitor blood glucose per PCP/Endocrinologist's recommendations. Continue soft, supportive shoe gear daily. Report any pedal injuries to medical professional. Call office if there are any questions/concerns.  Return in about 10 weeks (around 05/09/2024).  [name] Ferguson, DPM      [place] LOCATION: [address] [place] [address]                   Office [phone]   [facility] LOCATION: [address] [place], [place] [address] Office [phone]

## 2024-03-05 ENCOUNTER — Encounter: Payer: Self-pay | Admitting: Podiatry

## 2024-03-12 ENCOUNTER — Ambulatory Visit: Admitting: Urology

## 2024-03-14 ENCOUNTER — Ambulatory Visit: Payer: Medicare Other | Admitting: Dermatology

## 2024-03-18 NOTE — Progress Notes (Unsigned)
 03/19/24 2:23 PM   [name] Ferguson 04-26-1941 [id number]  Referring provider:  Lenon Layman ORN, MD [address] [facility] [place],  [place] [address]  Urological history  1. BPH with LU TS -PSA (2022)  2.6  -tamsulosin  0.4 mg daily   2. Prostate nodule -contrast CT 2017 - Enlarged prostate with small internal low attenuating structure that is not well visualized by CT; differential considerations include, but are not limited to a BPH nodule or sequela of prostatitis with possible tiny non drainable fluid collection  - stable on exam    3. Elevated PSA -7.34 ng/mL on 06/10/2016  Chief Complaint  Patient presents with   Benign prostatic hyperplasia with lower urinary tract sympt    HPI: [name] Ferguson is a 83 y.o. man who presents today for one year follow up with his wife, [name].   Previous records reviewed.     I PSS 21/0  He is having feelings of incomplete bladder emptying, urinary frequency, urinary urgency, urge incontinence, postvoid dribbling, and nocturia x 3.  He reports no urinary intermittency, no weak urinary stream, and no straining to void.  Patient denies any modifying or aggravating factors.  Patient denies any recent UTI's, gross hematuria, dysuria or suprapubic/flank pain.  Patient denies any fevers, chills, nausea or vomiting.    PVR 5 mL   Diuretics:  Torsemide  20 mg daily and spironolactone 25 mg daily  Fluid consumptiom: 3 to 4, 16 ounce bottles of water daily and 2 cups of coffee  Serum creatinine (12/2023) 1.9, eGFR 35  Hbg A1c (11/2023) 6.9  He is taking tamsulosin  0.4 mg daily  He does not have confidence that he could get and keep an erection, his erections are not firm enough for penetrative intercourse, he has difficulty maintaining his erections,  and he is not finding intercourse satisfactory for him.    Patient is not having spontaneous erections.     He denies any pain or curvature with erections.  He is able  to ejaculate with physical stimulus.    Cholesterol 11/2023) 83  TSH (11/2023) 6.879   He said that his cardiologist will not clear him for the use of PDE 5 inhibitors.  His wife, will not clear him for sexual activity, as she is concerned he may have a cardiac arrest during intercourse.   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   Squamous cell carcinoma of skin 03/27/2023   Posterior vertex scalp. WD SCC. 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 If you have any questions, ask your nurse or doctor.          amiodarone 200 MG tablet Commonly known as: PACERONE Take 300  mg by mouth daily.   Brinzolamide-Brimonidine 1-0.2 % Susp Place 1 drop into both eyes daily at 12 noon.   cephALEXin 500 MG capsule Commonly known as: KEFLEX Take 4 pills one hour prior to surgical appointment.then three times a day   clopidogrel  75 MG tablet Commonly known as: PLAVIX  Take 75 mg by mouth daily.   doxycycline  20 MG tablet Commonly known as: PERIOSTAT  TAKE 1 TABLET(20 MG) BY MOUTH EVERY DAY WITH DINNER What changed: See the new instructions.   Droplet Pen Needles 31G X 8 MM Misc Generic drug: Insulin  Pen Needle 4 (four) times daily.   Embecta Pen Needle Ultrafine 31G X 5 MM Misc Generic drug: Insulin  Pen Needle SMARTSIG:injection 4 Times Daily   Eliquis 2.5 MG Tabs tablet Generic drug: apixaban Take 2.5 mg by mouth 2 (two) times daily.   ezetimibe 10 MG tablet Commonly known as: ZETIA Take 1 tablet by mouth daily.   glucagon 1 MG injection Inject 1 mg into the muscle once as needed (Low blood sugar).   HYDROcodone -acetaminophen  5-325 MG tablet Commonly known as: NORCO/VICODIN Take 1 tablet by mouth every 6 (six) hours as needed.   icosapent Ethyl 1 g capsule Commonly known as: VASCEPA Take 2 g by mouth 2 (two) times daily.   insulin  glargine 100 UNIT/ML Solostar Pen Commonly known as: LANTUS Inject 35 Units into the skin daily.   Jardiance 10 MG Tabs tablet Generic drug: empagliflozin Take 10 mg by mouth daily.   ketorolac 0.5 % ophthalmic solution Commonly known as: ACULAR Apply 1 drop to eye.   latanoprost  0.005 % ophthalmic solution Commonly known as: XALATAN  Place 1 drop into both eyes at bedtime.   levothyroxine 112 MCG tablet Commonly known as: SYNTHROID Take 112 mcg by mouth daily before breakfast.   lisinopril  2.5 MG tablet Commonly known as: ZESTRIL  Take 2.5 mg by mouth daily.   metoprolol  succinate 25 MG 24 hr tablet Commonly known as: TOPROL -XL Take 25 mg by mouth daily.   MULTIVITAMIN ADULT PO Take 1 tablet by mouth  daily at 12 noon.   nitroGLYCERIN 0.4 MG SL tablet Commonly known as: NITROSTAT Place 0.4 mg under the tongue every 5 (five) minutes as needed for chest pain.   potassium chloride 10 MEQ tablet Commonly known as: KLOR-CON Take 10 mEq by mouth daily.   pravastatin  40 MG tablet Commonly known as: PRAVACHOL  Take 40 mg by mouth daily.   Repatha SureClick 140 MG/ML Soaj Generic drug: Evolocumab Inject 1 mL into the skin every 14 (fourteen) days.   spironolactone 25 MG tablet Commonly known as: ALDACTONE Take 12.5 mg by mouth.   tamsulosin  0.4 MG Caps capsule Commonly known as: FLOMAX  TAKE 1 CAPSULE(0.4 MG) BY MOUTH DAILY  What changed:  how much to take how to take this when to take this additional instructions   torsemide  20 MG tablet Commonly known as: DEMADEX  Take 20 mg by mouth daily. May take one more tablet if swelling occurs   Trulicity 3 MG/0.5ML Soaj Generic drug: Dulaglutide Inject 3 mg into the skin once a week.        Allergies:  Allergies  Allergen Reactions   Statins Other (See Comments)    Suspected cause of muscle pain   Metformin Diarrhea    Family History: Family History  Problem Relation Age of Onset   Diabetes Mother    Alzheimer's disease Mother    Cancer Sister    Heart disease Other        Family members in general   Diabetes Brother    Prostate cancer Neg Hx    Bladder Cancer Neg Hx    Kidney disease Neg Hx     Social History:  reports that he quit smoking about 42 years ago. His smoking use included cigarettes. He has never used smokeless tobacco. He reports current alcohol use. He reports that he does not use drugs.   Physical Exam: BP 126/64 (BP Location: Left Arm, Patient Position: Sitting, Cuff Size: Normal)   Pulse 76   Ht 5' 6 (1.676 m)   Wt 204 lb (92.5 kg)   BMI 32.93 kg/m   Constitutional:  Well nourished. Alert and oriented, No acute distress. HEENT: [place] AT, moist mucus membranes.  Trachea  midline Cardiovascular: No clubbing, cyanosis, or edema. Respiratory: Normal respiratory effort, no increased work of breathing. Neurologic: Grossly intact, no focal deficits, moving all 4 extremities. Psychiatric: Normal mood and affect.   Laboratory Data: See HPI and EPIC I have reviewed the labs.  See HPI.     Pertinent Imaging  03/19/24 14:07  Scan Result 5ml    Assessment & Plan:    1. BPH with LU TS - stable, severe symptoms  - no signs of retention - PVR < 300 cc  - most bothersome symptoms are urgency - encouraged avoiding bladder irritants, fluid restriction before bedtime and timed voiding's - Continue tamsulosin  0.4 mg daily  - educated on red flag symptoms: acute retention, gross hematuria, fever, severe pain - advised to call clinic or go to the ED if these occur - return to clinic in 12 months symptom re-evaluation   2. 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 Urgency - patient does not want to try an OAB agent, he does not find his symptoms bothersome    Return in about 1 year (around 03/19/2025) for Medication management.  CLOTILDA HELON RIGGERS   [facility] [address], [address] [place], [place] [address] [phone]

## 2024-03-19 ENCOUNTER — Encounter: Payer: Self-pay | Admitting: Urology

## 2024-03-19 ENCOUNTER — Ambulatory Visit (INDEPENDENT_AMBULATORY_CARE_PROVIDER_SITE_OTHER): Admitting: Urology

## 2024-03-19 VITALS — BP 126/64 | HR 76 | Ht 66.0 in | Wt 204.0 lb

## 2024-03-19 DIAGNOSIS — N529 Male erectile dysfunction, unspecified: Secondary | ICD-10-CM | POA: Diagnosis not present

## 2024-03-19 DIAGNOSIS — R3915 Urgency of urination: Secondary | ICD-10-CM

## 2024-03-19 DIAGNOSIS — N401 Enlarged prostate with lower urinary tract symptoms: Secondary | ICD-10-CM | POA: Diagnosis not present

## 2024-03-19 LAB — BLADDER SCAN AMB NON-IMAGING

## 2024-03-19 MED ORDER — TAMSULOSIN HCL 0.4 MG PO CAPS: ORAL_CAPSULE | ORAL | 3 refills | Status: AC

## 2024-04-01 ENCOUNTER — Ambulatory Visit: Admitting: Dermatology

## 2024-04-01 ENCOUNTER — Encounter: Payer: Self-pay | Admitting: Dermatology

## 2024-04-01 DIAGNOSIS — Z1283 Encounter for screening for malignant neoplasm of skin: Secondary | ICD-10-CM | POA: Diagnosis not present

## 2024-04-01 DIAGNOSIS — D1801 Hemangioma of skin and subcutaneous tissue: Secondary | ICD-10-CM

## 2024-04-01 DIAGNOSIS — Z8589 Personal history of malignant neoplasm of other organs and systems: Secondary | ICD-10-CM

## 2024-04-01 DIAGNOSIS — L814 Other melanin hyperpigmentation: Secondary | ICD-10-CM | POA: Diagnosis not present

## 2024-04-01 DIAGNOSIS — L821 Other seborrheic keratosis: Secondary | ICD-10-CM | POA: Diagnosis not present

## 2024-04-01 DIAGNOSIS — L578 Other skin changes due to chronic exposure to nonionizing radiation: Secondary | ICD-10-CM

## 2024-04-01 DIAGNOSIS — W908XXA Exposure to other nonionizing radiation, initial encounter: Secondary | ICD-10-CM

## 2024-04-01 DIAGNOSIS — Z85828 Personal history of other malignant neoplasm of skin: Secondary | ICD-10-CM

## 2024-04-01 DIAGNOSIS — L570 Actinic keratosis: Secondary | ICD-10-CM | POA: Diagnosis not present

## 2024-04-01 DIAGNOSIS — D692 Other nonthrombocytopenic purpura: Secondary | ICD-10-CM

## 2024-04-01 DIAGNOSIS — D229 Melanocytic nevi, unspecified: Secondary | ICD-10-CM

## 2024-04-01 NOTE — Patient Instructions (Signed)

## 2024-04-01 NOTE — Progress Notes (Signed)
 Follow-Up Visit   Subjective  [name] Ferguson is a 83 y.o. male who presents for the following: Skin Cancer Screening and Upper Body Skin Exam, hx of SCC  The patient presents for Upper Body Skin Exam (UBSE) for skin cancer screening and mole check. The patient has spots, moles and lesions to be evaluated, some may be new or changing and the patient may have concern these could be cancer.  The following portions of the chart were reviewed this encounter and updated as appropriate: medications, allergies, medical history  Review of Systems:  No other skin or systemic complaints except as noted in HPI or Assessment and Plan.  Objective  Well appearing patient in no apparent distress; mood and affect are within normal limits.  All skin waist up examined. Relevant physical exam findings are noted in the Assessment and Plan.  left wrist x 1, right zygoma/Periorbital x 1 (2) Erythematous thin papules/macules with gritty scale.    Assessment & Plan   AK (ACTINIC KERATOSIS) (2) left wrist x 1, right zygoma/Periorbital x 1 (2) ACTINIC DAMAGE - chronic, secondary to cumulative UV radiation exposure/sun exposure over time - diffuse scaly erythematous macules with underlying dyspigmentation - Recommend daily broad spectrum sunscreen SPF 30+ to sun-exposed areas, reapply every 2 hours as needed.  - Recommend staying in the shade or wearing long sleeves, sun glasses (UVA+UVB protection) and wide brim hats (4-inch brim around the entire circumference of the hat). - Call for new or changing lesions.   If area treated on the left wrist is not gone in 6-8 weeks return to the office for biopsy Destruction of lesion - left wrist x 1, right zygoma/Periorbital x 1 (2) Complexity: simple   Destruction method: cryotherapy   Informed consent: discussed and consent obtained   Timeout:  patient name, date of birth, surgical site, and procedure verified Lesion destroyed using liquid nitrogen: Yes    Region frozen until ice ball extended beyond lesion: Yes   Outcome: patient tolerated procedure well with no complications   Post-procedure details: wound care instructions given     Skin cancer screening performed today.  Actinic Damage - Chronic condition, secondary to cumulative UV/sun exposure - diffuse scaly erythematous macules with underlying dyspigmentation - Recommend daily broad spectrum sunscreen SPF 30+ to sun-exposed areas, reapply every 2 hours as needed.  - Staying in the shade or wearing long sleeves, sun glasses (UVA+UVB protection) and wide brim hats (4-inch brim around the entire circumference of the hat) are also recommended for sun protection.  - Call for new or changing lesions.  Lentigines, Seborrheic Keratoses, Hemangiomas - Benign normal skin lesions - Benign-appearing - Call for any changes  Melanocytic Nevi - Tan-brown and/or pink-flesh-colored symmetric macules and papules - Benign appearing on exam today - Observation - Call clinic for new or changing moles - Recommend daily use of broad spectrum spf 30+ sunscreen to sun-exposed areas.   Purpura - Chronic; persistent and recurrent.  Treatable, but not curable. - Violaceous macules and patches - Benign - Related to trauma, age, sun damage and/or use of blood thinners, chronic use of topical and/or oral steroids - Observe - Can use OTC arnica containing moisturizer such as Dermend Bruise Formula if desired - Call for worsening or other concerns   HISTORY OF SQUAMOUS CELL CARCINOMA OF THE SKIN - No evidence of recurrence today - No lymphadenopathy - Recommend regular full body skin exams - Recommend daily broad spectrum sunscreen SPF 30+ to sun-exposed areas, reapply every  2 hours as needed.  - Call if any new or changing lesions are noted between office visits    Return in about 6 months (around 09/29/2024) for Aks .  IFay Kirks, CMA, am acting as scribe for Alm Rhyme, MD .    Documentation: I have reviewed the above documentation for accuracy and completeness, and I agree with the above.  Alm Rhyme, MD

## 2024-05-09 ENCOUNTER — Ambulatory Visit (INDEPENDENT_AMBULATORY_CARE_PROVIDER_SITE_OTHER): Admitting: Podiatry

## 2024-05-09 DIAGNOSIS — Z91199 Patient's noncompliance with other medical treatment and regimen due to unspecified reason: Secondary | ICD-10-CM

## 2024-05-09 NOTE — Progress Notes (Signed)
 1. No-show for appointment

## 2024-05-16 ENCOUNTER — Ambulatory Visit: Admitting: Podiatry

## 2024-05-20 ENCOUNTER — Ambulatory Visit (INDEPENDENT_AMBULATORY_CARE_PROVIDER_SITE_OTHER): Admitting: Podiatry

## 2024-05-20 DIAGNOSIS — E1142 Type 2 diabetes mellitus with diabetic polyneuropathy: Secondary | ICD-10-CM | POA: Diagnosis not present

## 2024-05-20 DIAGNOSIS — M79674 Pain in right toe(s): Secondary | ICD-10-CM | POA: Diagnosis not present

## 2024-05-20 DIAGNOSIS — B351 Tinea unguium: Secondary | ICD-10-CM | POA: Diagnosis not present

## 2024-05-20 DIAGNOSIS — M79675 Pain in left toe(s): Secondary | ICD-10-CM | POA: Diagnosis not present

## 2024-05-27 ENCOUNTER — Encounter: Payer: Self-pay | Admitting: Podiatry

## 2024-05-27 NOTE — Progress Notes (Signed)
  Subjective:  Patient ID: [name] Ferguson, male    DOB: [birth year],  MRN: [id number]  JUD FANGUY presents to clinic today for at risk foot care with history of diabetic neuropathy and painful mycotic toenails of both feet that are difficult to trim. Pain interferes with daily activities and wearing enclosed shoe gear comfortably.  Chief Complaint  Patient presents with   Toe Pain    Dr. Lenon is his PCP. He states his A1c is 6 something. Last visit was in may   New problem(s): None.   PCP is Lenon Layman ORN, MD.  Allergies  Allergen Reactions   Statins Other (See Comments)    Suspected cause of muscle pain   Metformin Diarrhea    Review of Systems: Negative except as noted in the HPI.  Objective: No changes noted in today's physical examination. There were no vitals filed for this visit. [name] Ferguson is a pleasant 83 y.o. male in NAD. AAO x 3.  Vascular Examination: Capillary refill time immediate b/l. Palpable pedal pulses. Pedal hair present b/l. Pedal edema b/l ankles. No pain with calf compression b/l. Skin temperature gradient WNL b/l. No cyanosis or clubbing b/l. No ischemia or gangrene noted b/l.   Neurological Examination: Pt has subjective symptoms of neuropathy. Protective sensation decreased with 10 gram monofilament b/l.  Dermatological Examination: Pedal skin with normal turgor, texture and tone b/l.  No open wounds. No interdigital macerations.   Toenails 1-5 b/l thick, discolored, elongated with subungual debris and pain on dorsal palpation.   No hyperkeratotic nor porokeratotic lesions.  Musculoskeletal Examination: Muscle strength 5/5 to all lower extremity muscle groups bilaterally. No pain, crepitus or joint limitation noted with ROM b/l LE. Hammertoe(s) 2-5 b/l.SABRA Patient ambulates with cane assistance.  Radiographs: None  Assessment/Plan: 1. Pain due to onychomycosis of toenails of both feet   2. Diabetic polyneuropathy associated with  type 2 diabetes mellitus [facility])   Consent given for treatment. Patient examined. All patient's and/or POA's questions/concerns addressed on today's visit. Mycotic toenails 1-5 b/l debrided in length and girth without incident. Continue foot and shoe inspections daily. Monitor blood glucose per PCP/Endocrinologist's recommendations.Continue soft, supportive shoe gear daily. Report any pedal injuries to medical professional. Call office if there are any quesitons/concerns. -Patient/POA to call should there be question/concern in the interim.   Return in about 3 months (around 08/20/2024).  Delon LITTIE Merlin, DPM      [facility] LOCATION: [address] [place] [address]                   Office [phone]   [facility] LOCATION: [address] [place], [place] [address] Office [phone]

## 2024-07-29 ENCOUNTER — Ambulatory Visit: Admitting: Dermatology

## 2024-07-29 ENCOUNTER — Encounter: Payer: Self-pay | Admitting: Dermatology

## 2024-07-29 DIAGNOSIS — L820 Inflamed seborrheic keratosis: Secondary | ICD-10-CM | POA: Diagnosis not present

## 2024-07-29 DIAGNOSIS — Z7189 Other specified counseling: Secondary | ICD-10-CM

## 2024-07-29 DIAGNOSIS — L570 Actinic keratosis: Secondary | ICD-10-CM

## 2024-07-29 DIAGNOSIS — L578 Other skin changes due to chronic exposure to nonionizing radiation: Secondary | ICD-10-CM

## 2024-07-29 DIAGNOSIS — L821 Other seborrheic keratosis: Secondary | ICD-10-CM

## 2024-07-29 DIAGNOSIS — W908XXA Exposure to other nonionizing radiation, initial encounter: Secondary | ICD-10-CM

## 2024-07-29 NOTE — Patient Instructions (Addendum)
 Seborrheic Keratosis  What causes seborrheic keratoses? Seborrheic keratoses are harmless, common skin growths that first appear during adult life.  As time goes by, more growths appear.  Some people may develop a large number of them.  Seborrheic keratoses appear on both covered and uncovered body parts.  They are not caused by sunlight.  The tendency to develop seborrheic keratoses can be inherited.  They vary in color from skin-colored to gray, brown, or even black.  They can be either smooth or have a rough, warty surface.   Seborrheic keratoses are superficial and look as if they were stuck on the skin.  Under the microscope this type of keratosis looks like layers upon layers of skin.  That is why at times the top layer may seem to fall off, but the rest of the growth remains and re-grows.    Treatment Seborrheic keratoses do not need to be treated, but can easily be removed in the office.  Seborrheic keratoses often cause symptoms when they rub on clothing or jewelry.  Lesions can be in the way of shaving.  If they become inflamed, they can cause itching, soreness, or burning.  Removal of a seborrheic keratosis can be accomplished by freezing, burning, or surgery. If any spot bleeds, scabs, or grows rapidly, please return to have it checked, as these can be an indication of a skin cancer.  Cryotherapy Aftercare  Wash gently with soap and water everyday.   Apply Vaseline and Band-Aid daily until healed.   Actinic keratoses are precancerous spots that appear secondary to cumulative UV radiation exposure/sun exposure over time. They are chronic with expected duration over 1 year. A portion of actinic keratoses will progress to squamous cell carcinoma of the skin. It is not possible to reliably predict which spots will progress to skin cancer and so treatment is recommended to prevent development of skin cancer.  Recommend daily broad spectrum sunscreen SPF 30+ to sun-exposed areas, reapply  every 2 hours as needed.  Recommend staying in the shade or wearing long sleeves, sun glasses (UVA+UVB protection) and wide brim hats (4-inch brim around the entire circumference of the hat). Call for new or changing lesions.    Due to recent changes in healthcare laws, you may see results of your pathology and/or laboratory studies on MyChart before the doctors have had a chance to review them. We understand that in some cases there may be results that are confusing or concerning to you. Please understand that not all results are received at the same time and often the doctors may need to interpret multiple results in order to provide you with the best plan of care or course of treatment. Therefore, we ask that you please give us  2 business days to thoroughly review all your results before contacting the office for clarification. Should we see a critical lab result, you will be contacted sooner.   If You Need Anything After Your Visit  If you have any questions or concerns for your doctor, please call our main line at [phone] and press option 4 to reach your doctor's medical assistant. If no one answers, please leave a voicemail as directed and we will return your call as soon as possible. Messages left after 4 pm will be answered the following business day.   You may also send us  a message via MyChart. We typically respond to MyChart messages within 1-2 business days.  For prescription refills, please ask your pharmacy to contact our office. Our fax  number is [phone].  If you have an urgent issue when the clinic is closed that cannot wait until the next business day, you can page your doctor at the number below.    Please note that while we do our best to be available for urgent issues outside of office hours, we are not available 24/7.   If you have an urgent issue and are unable to reach us , you may choose to seek medical care at your doctor's office, retail clinic, urgent care  center, or emergency room.  If you have a medical emergency, please immediately call 911 or go to the emergency department.  Pager Numbers  - Dr. Hester: [phone]  - Dr. Jackquline: [phone]  - Dr. Claudene: [phone]   - Dr. Raymund: [phone]  In the event of inclement weather, please call our main line at [phone] for an update on the status of any delays or closures.  Dermatology Medication Tips: Please keep the boxes that topical medications come in in order to help keep track of the instructions about where and how to use these. Pharmacies typically print the medication instructions only on the boxes and not directly on the medication tubes.   If your medication is too expensive, please contact our office at [phone] option 4 or send us  a message through MyChart.   We are unable to tell what your co-pay for medications will be in advance as this is different depending on your insurance coverage. However, we may be able to find a substitute medication at lower cost or fill out paperwork to get insurance to cover a needed medication.   If a prior authorization is required to get your medication covered by your insurance company, please allow us  1-2 business days to complete this process.  Drug prices often vary depending on where the prescription is filled and some pharmacies may offer cheaper prices.  The website www.goodrx.com contains coupons for medications through different pharmacies. The prices here do not account for what the cost may be with help from insurance (it may be cheaper with your insurance), but the website can give you the price if you did not use any insurance.  - You can print the associated coupon and take it with your prescription to the pharmacy.  - You may also stop by our office during regular business hours and pick up a GoodRx coupon card.  - If you need your prescription sent electronically to a different pharmacy, notify our office  through [facility] or by phone at [phone] option 4.     Si Usted Necesita Algo Despus de Su Visita  Tambin puede enviarnos un mensaje a travs de Clinical Cytogeneticist. Por lo general respondemos a los mensajes de MyChart en el transcurso de 1 a 2 das hbiles.  Para renovar recetas, por favor pida a su farmacia que se ponga en contacto con nuestra oficina. Randi lakes de fax es [place] [phone].  Si tiene un asunto urgente cuando la clnica est cerrada y que no puede esperar hasta el siguiente da hbil, puede llamar/localizar a su doctor(a) al nmero que aparece a continuacin.   Por favor, tenga en cuenta que aunque hacemos todo lo posible para estar disponibles para asuntos urgentes fuera del horario de [place], no estamos disponibles las 24 horas del da, los 7 [address]  [address] de la [place].   Si tiene un problema urgente y no puede comunicarse con nosotros, puede optar por buscar atencin mdica  en el consultorio de su doctor(a), en ignacia  clnica privada, en un centro de atencin urgente o en una sala de emergencias.  Si tiene engineer, drilling, por favor llame inmediatamente al 911 o vaya a la sala de emergencias.  Nmeros de bper  - Dr. Hester: [phone]  - Dra. Jackquline: [phone]  - Dr. Claudene: [phone]  - Dra. Kitts: [phone]  En caso de inclemencias del [place], por favor llame a nuestra lnea principal al [phone] para una actualizacin sobre el estado de cualquier retraso o cierre.  Consejos para la medicacin en dermatologa: Por favor, guarde las cajas en las que vienen los medicamentos de uso tpico para ayudarle a seguir las instrucciones sobre dnde y cmo usarlos. Las farmacias generalmente imprimen las instrucciones del medicamento slo en las cajas y no directamente en los tubos del [place].   Si su medicamento es muy caro, por favor, pngase en contacto con landry rieger llamando al [phone] y presione la opcin 4 o envenos un mensaje  a travs de Clinical Cytogeneticist.   No podemos decirle cul ser su copago por los medicamentos por adelantado ya que esto es diferente dependiendo de la cobertura de su seguro. Sin embargo, es posible que podamos encontrar un medicamento sustituto a audiological scientist un formulario para que el seguro cubra el medicamento que se considera necesario.   Si se requiere una autorizacin previa para que su compaa de seguros cubra su medicamento, por favor permtanos de 1 a 2 das hbiles para completar este proceso.  Los precios de los medicamentos varan con frecuencia dependiendo del environmental consultant de dnde se surte la receta y alguna farmacias pueden ofrecer precios ms baratos.  El sitio web www.goodrx.com tiene cupones para medicamentos de health and safety inspector. Los precios aqu no tienen en cuenta lo que podra costar con la ayuda del seguro (puede ser ms barato con su seguro), pero el sitio web puede darle el precio si no utiliz tourist information centre manager.  - Puede imprimir el cupn correspondiente y llevarlo con su receta a la farmacia.  - Tambin puede pasar por nuestra oficina durante el horario de atencin regular y education officer, museum una tarjeta de cupones de GoodRx.  - Si necesita que su receta se enve electrnicamente a una farmacia diferente, informe a nuestra oficina a travs de MyChart de [place] o por telfono llamando al [phone] y presione la opcin 4.

## 2024-07-29 NOTE — Progress Notes (Signed)
 "  Follow-Up Visit   Subjective  [name] Ferguson is a 84 y.o. male who presents for the following: 6 month ak follow up Also wife noticed a spot on back of left thigh  Also mentions a spot at scalp Also hx of aks left wrist  Right zygoma / periorbital   The following portions of the chart were reviewed this encounter and updated as appropriate: medications, allergies, medical history  Review of Systems:  No other skin or systemic complaints except as noted in HPI or Assessment and Plan.  Objective  Well appearing patient in no apparent distress; mood and affect are within normal limits.   A focused examination was performed of the following areas: Face; scalp; arms; hands  Relevant exam findings are noted in the Assessment and Plan.  right vertex scalp x 1, right cheek x 3, left wrist x 1, left posterior thigh x1 (6) Erythematous stuck-on, waxy papule or plaque face x 10 (10) Erythematous thin papules/macules with gritty scale.   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  - Recommend staying in the shade or wearing long sleeves, sun glasses (UVA+UVB protection) and wide brim hats (4-inch brim around the entire circumference of the hat). - Call for new or changing lesions.  SEBORRHEIC KERATOSIS - Stuck-on, waxy, tan-brown papules and/or plaques  - Benign-appearing - Discussed benign etiology and prognosis. - Observe - Call for any changes  INFLAMED SEBORRHEIC KERATOSIS (6) right vertex scalp x 1, right cheek x 3, left wrist x 1, left posterior thigh x1 (6) Symptomatic, irritating, patient would like treated. 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 They are chronic with expected duration over 1 year. A portion of actinic keratoses will progress to squamous cell carcinoma of the skin. It is not possible to reliably predict which spots will progress to skin cancer and so treatment is recommended to prevent development of skin cancer.  Recommend daily broad spectrum sunscreen SPF 30+ to sun-exposed areas, reapply every 2 hours as needed.  Recommend staying in the shade or wearing long sleeves, sun glasses (UVA+UVB protection) and wide brim hats (4-inch brim around the entire circumference of the hat). Call for new or changing lesions. - Destruction of lesion - face x 10 (10) Complexity: simple   Destruction method: cryotherapy   Informed consent: discussed and consent obtained   Timeout:  patient name, date of birth, surgical site, and procedure verified Lesion destroyed using liquid nitrogen: Yes   Region frozen until ice ball extended beyond lesion: Yes   Outcome: patient tolerated procedure well with no complications   Post-procedure details: wound care instructions given     Return in about  6 months (around 01/26/2025) for tbse hx of scc .  IEleanor Blush, CMA, am acting as scribe for Alm Rhyme, MD.   Documentation: I have reviewed the above documentation for accuracy and completeness, and I agree with the above.  Alm Rhyme, MD    "

## 2024-08-29 ENCOUNTER — Ambulatory Visit: Admitting: Podiatry

## 2024-09-02 ENCOUNTER — Ambulatory Visit: Admitting: Podiatry

## 2024-09-05 ENCOUNTER — Ambulatory Visit: Admitting: Podiatry

## 2024-09-30 ENCOUNTER — Ambulatory Visit: Admitting: Dermatology

## 2025-01-29 ENCOUNTER — Ambulatory Visit: Admitting: Dermatology

## 2025-03-19 ENCOUNTER — Ambulatory Visit: Admitting: Urology
